# Patient Record
Sex: Female | Born: 1985 | Race: Black or African American | Hispanic: No | Marital: Single | Smoking: Current some day smoker
Health system: Southern US, Community
[De-identification: ages and names within clinical notes are randomized; demographics above are authoritative.]

## PROBLEM LIST (undated history)

## (undated) ENCOUNTER — Inpatient Hospital Stay (HOSPITAL_COMMUNITY): Payer: Self-pay

## (undated) DIAGNOSIS — Z8619 Personal history of other infectious and parasitic diseases: Secondary | ICD-10-CM

## (undated) DIAGNOSIS — R87619 Unspecified abnormal cytological findings in specimens from cervix uteri: Secondary | ICD-10-CM

## (undated) DIAGNOSIS — J45909 Unspecified asthma, uncomplicated: Secondary | ICD-10-CM

## (undated) DIAGNOSIS — IMO0002 Reserved for concepts with insufficient information to code with codable children: Secondary | ICD-10-CM

## (undated) HISTORY — DX: Unspecified abnormal cytological findings in specimens from cervix uteri: R87.619

## (undated) HISTORY — PX: COLPOSCOPY: SHX161

## (undated) HISTORY — DX: Reserved for concepts with insufficient information to code with codable children: IMO0002

## (undated) HISTORY — DX: Personal history of other infectious and parasitic diseases: Z86.19

---

## 2010-11-13 ENCOUNTER — Encounter: Payer: Self-pay | Admitting: *Deleted

## 2010-11-13 ENCOUNTER — Emergency Department (HOSPITAL_COMMUNITY)
Admission: EM | Admit: 2010-11-13 | Discharge: 2010-11-13 | Disposition: A | Discharge status: Home / Self Care | Payer: Self-pay | Attending: Emergency Medicine | Admitting: Emergency Medicine

## 2010-11-13 DIAGNOSIS — T23139A Burn of first degree of unspecified multiple fingers (nail), not including thumb, initial encounter: Secondary | ICD-10-CM | POA: Insufficient documentation

## 2010-11-13 DIAGNOSIS — T300 Burn of unspecified body region, unspecified degree: Secondary | ICD-10-CM

## 2010-11-13 DIAGNOSIS — X131XXA Other contact with steam and other hot vapors, initial encounter: Secondary | ICD-10-CM | POA: Insufficient documentation

## 2010-11-13 DIAGNOSIS — F17200 Nicotine dependence, unspecified, uncomplicated: Secondary | ICD-10-CM | POA: Insufficient documentation

## 2010-11-13 DIAGNOSIS — X12XXXA Contact with other hot fluids, initial encounter: Secondary | ICD-10-CM | POA: Insufficient documentation

## 2010-11-13 MED ORDER — IBUPROFEN 800 MG PO TABS
800.0000 mg | ORAL_TABLET | Freq: Once | ORAL | Status: AC
  Administered 2010-11-13: 800 mg via ORAL
  Filled 2010-11-13: qty 1

## 2010-11-13 MED ORDER — HYDROCODONE-ACETAMINOPHEN 5-325 MG PO TABS
1.0000 | ORAL_TABLET | Freq: Once | ORAL | Status: AC
  Administered 2010-11-13: 1 via ORAL
  Filled 2010-11-13: qty 1

## 2010-11-13 MED ORDER — IBUPROFEN 800 MG PO TABS: 800.0000 mg | ORAL_TABLET | Freq: Once | ORAL | Status: AC

## 2010-11-13 MED ORDER — HYDROCODONE-ACETAMINOPHEN 5-325 MG PO TABS: 1.0000 | ORAL_TABLET | ORAL | Status: AC | PRN

## 2010-11-13 MED ORDER — SILVER SULFADIAZINE 1 % EX CREA
TOPICAL_CREAM | Freq: Once | CUTANEOUS | Status: AC
  Administered 2010-11-13: 18:00:00 via TOPICAL
  Filled 2010-11-13: qty 50
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
## 2010-11-15 NOTE — ED Provider Notes (Signed)
Medical screening examination/treatment/procedure(s) were performed by non-physician practitioner and as supervising physician I was immediately available for consultation/collaboration.  Joya Gaskins, MD 11/15/10 [phone]

## 2011-04-15 ENCOUNTER — Emergency Department (HOSPITAL_COMMUNITY)
Admission: EM | Admit: 2011-04-15 | Discharge: 2011-04-15 | Disposition: A | Discharge status: Home / Self Care | Payer: Medicaid - Out of State | Attending: Emergency Medicine | Admitting: Emergency Medicine

## 2011-04-15 ENCOUNTER — Encounter (HOSPITAL_COMMUNITY): Payer: Self-pay | Admitting: Oncology

## 2011-04-15 DIAGNOSIS — J029 Acute pharyngitis, unspecified: Secondary | ICD-10-CM

## 2011-04-15 DIAGNOSIS — R05 Cough: Secondary | ICD-10-CM | POA: Insufficient documentation

## 2011-04-15 DIAGNOSIS — F17200 Nicotine dependence, unspecified, uncomplicated: Secondary | ICD-10-CM | POA: Insufficient documentation

## 2011-04-15 DIAGNOSIS — J3489 Other specified disorders of nose and nasal sinuses: Secondary | ICD-10-CM | POA: Insufficient documentation

## 2011-04-15 DIAGNOSIS — R059 Cough, unspecified: Secondary | ICD-10-CM | POA: Insufficient documentation

## 2011-04-15 DIAGNOSIS — R599 Enlarged lymph nodes, unspecified: Secondary | ICD-10-CM | POA: Insufficient documentation

## 2011-04-15 LAB — RAPID STREP SCREEN (MED CTR MEBANE ONLY): Streptococcus, Group A Screen (Direct): NEGATIVE

## 2011-04-15 MED ORDER — AMOXICILLIN 500 MG PO CAPS: 500.0000 mg | ORAL_CAPSULE | Freq: Three times a day (TID) | ORAL | Status: AC

## 2011-04-15 MED ORDER — MAGIC MOUTHWASH W/LIDOCAINE: ORAL | Status: DC

## 2011-04-15 NOTE — ED Provider Notes (Signed)
History     CSN: [id number]  Arrival date & time 04/15/11  4098   First MD Initiated Contact with Patient 04/15/11 [phone]      Chief Complaint  Patient presents with  . Sore Throat  . Cough    (Consider location/radiation/quality/duration/timing/severity/associated sxs/prior treatment) Patient is a 26 y.o. female presenting with pharyngitis and cough. The history is provided by the patient.  Sore Throat This is a new problem. The current episode started in the past 7 days. The problem occurs constantly. The problem has been gradually worsening. Associated symptoms include congestion, coughing, a sore throat and swollen glands. Pertinent negatives include no abdominal pain, arthralgias, change in bowel habit, chills, fever, headaches, myalgias, nausea, neck pain, numbness, urinary symptoms, vomiting or weakness. The symptoms are aggravated by swallowing. She has tried nothing for the symptoms. The treatment provided no relief.  Cough Associated symptoms include rhinorrhea and sore throat. Pertinent negatives include no chills, no ear pain, no headaches and no myalgias.    History reviewed. No pertinent past medical history.  History reviewed. No pertinent past surgical history.  History reviewed. No pertinent family history.  History  Substance Use Topics  . Smoking status: Current Everyday Smoker -- 0.2 packs/day    Types: Cigarettes  . Smokeless tobacco: Not on file  . Alcohol Use: Yes     occasionally    OB History    Grav Para Term Preterm Abortions TAB SAB Ect Mult Living                  Review of Systems  Constitutional: Positive for appetite change. Negative for fever, chills and activity change.  HENT: Positive for congestion, sore throat and rhinorrhea. Negative for ear pain, trouble swallowing, neck pain and neck stiffness.   Eyes: Negative for visual disturbance.  Respiratory: Positive for cough. Negative for chest tightness.   Gastrointestinal: Negative for  nausea, vomiting, abdominal pain and change in bowel habit.  Musculoskeletal: Negative for myalgias and arthralgias.  Neurological: Negative for dizziness, weakness, numbness and headaches.  Hematological: Positive for adenopathy.  All other systems reviewed and are negative.    Allergies  Review of patient's allergies indicates no known allergies.  Home Medications   Current Outpatient Rx  Name Route Sig Dispense Refill  . OVER THE COUNTER MEDICATION Oral Take 1 tablet by mouth daily as needed. Pain Reliever/Cough Medication     . PRESCRIPTION MEDICATION Oral Take 1 tablet by mouth daily. Birth Control       BP 113/66  Pulse 84  Temp(Src) 98 F (36.7 C) (Oral)  Resp 18  Ht 5\' 4"  (1.626 m)  Wt 152 lb (68.947 kg)  BMI 26.09 kg/m2  SpO2 100%  LMP 02/21/2011  Physical Exam  Nursing note and vitals reviewed. Constitutional: She is oriented to person, place, and time. She appears well-developed and well-nourished. No distress.  HENT:  Head: Normocephalic and atraumatic. No trismus in the jaw.  Right Ear: Tympanic membrane and ear canal normal.  Left Ear: Tympanic membrane and ear canal normal.  Nose: Nose normal.  Mouth/Throat: Uvula is midline and mucous membranes are normal. No uvula swelling or dental caries. Posterior oropharyngeal edema and posterior oropharyngeal erythema present. No oropharyngeal exudate or tonsillar abscesses.  Neck: Normal range of motion. Neck supple.  Cardiovascular: Normal rate, regular rhythm and normal heart sounds.   Pulmonary/Chest: Effort normal and breath sounds normal.  Lymphadenopathy:    She has cervical adenopathy.  Neurological: She is oriented to person,  place, and time. She exhibits normal muscle tone. Coordination normal.  Skin: Skin is warm and dry.    ED Course  Procedures (including critical care time)  Results for orders placed during the hospital encounter of 04/15/11  RAPID STREP SCREEN      Component Value Range    Streptococcus, Group A Screen (Direct) NEGATIVE  NEGATIVE         MDM    Patient is alert, NAD.  Mucous membranes are moist, non-toxic appearing.  No meningeal signs        Deretha Ertle L. Kohlton [place], [place] 04/15/11 1016

## 2011-04-15 NOTE — ED Notes (Signed)
Pt reports mild cough and sore throat onset x 2 days ago - taking a OTC cough medicine w/o relief of symptoms.  Denies fevers, vomiting, diarrhea.

## 2011-04-17 NOTE — ED Provider Notes (Signed)
Medical screening examination/treatment/procedure(s) were performed by non-physician practitioner and as supervising physician I was immediately available for consultation/collaboration.   Shelda Jakes, MD 04/17/11 1600

## 2011-05-26 ENCOUNTER — Emergency Department (HOSPITAL_COMMUNITY)
Admission: EM | Admit: 2011-05-26 | Discharge: 2011-05-26 | Disposition: A | Discharge status: Home / Self Care | Payer: Medicaid - Out of State | Attending: Emergency Medicine | Admitting: Emergency Medicine

## 2011-05-26 ENCOUNTER — Emergency Department (HOSPITAL_COMMUNITY): Payer: Medicaid - Out of State

## 2011-05-26 ENCOUNTER — Encounter (HOSPITAL_COMMUNITY): Payer: Self-pay | Admitting: *Deleted

## 2011-05-26 DIAGNOSIS — A499 Bacterial infection, unspecified: Secondary | ICD-10-CM | POA: Insufficient documentation

## 2011-05-26 DIAGNOSIS — O2390 Unspecified genitourinary tract infection in pregnancy, unspecified trimester: Secondary | ICD-10-CM | POA: Insufficient documentation

## 2011-05-26 DIAGNOSIS — F17200 Nicotine dependence, unspecified, uncomplicated: Secondary | ICD-10-CM | POA: Insufficient documentation

## 2011-05-26 DIAGNOSIS — B9689 Other specified bacterial agents as the cause of diseases classified elsewhere: Secondary | ICD-10-CM | POA: Insufficient documentation

## 2011-05-26 DIAGNOSIS — N760 Acute vaginitis: Secondary | ICD-10-CM | POA: Insufficient documentation

## 2011-05-26 DIAGNOSIS — N949 Unspecified condition associated with female genital organs and menstrual cycle: Secondary | ICD-10-CM | POA: Insufficient documentation

## 2011-05-26 LAB — URINALYSIS, ROUTINE W REFLEX MICROSCOPIC
Bilirubin Urine: NEGATIVE
Glucose, UA: NEGATIVE mg/dL
Ketones, ur: NEGATIVE mg/dL
Leukocytes, UA: NEGATIVE
Protein, ur: NEGATIVE mg/dL
pH: 5.5 (ref 5.0–8.0)

## 2011-05-26 LAB — DIFFERENTIAL
Basophils Absolute: 0 10*3/uL (ref 0.0–0.1)
Basophils Relative: 0 % (ref 0–1)
Eosinophils Absolute: 0.1 10*3/uL (ref 0.0–0.7)
Monocytes Relative: 7 % (ref 3–12)
Neutro Abs: 7 10*3/uL (ref 1.7–7.7)
Neutrophils Relative %: 68 % (ref 43–77)

## 2011-05-26 LAB — CBC
Hemoglobin: 13.2 g/dL (ref 12.0–15.0)
MCH: 33.4 pg (ref 26.0–34.0)
MCHC: 36.8 g/dL — ABNORMAL HIGH (ref 30.0–36.0)
Platelets: 262 10*3/uL (ref 150–400)
RDW: 12.4 % (ref 11.5–15.5)

## 2011-05-26 LAB — POCT PREGNANCY, URINE: Preg Test, Ur: POSITIVE — AB

## 2011-05-26 LAB — WET PREP, GENITAL

## 2011-05-26 LAB — ABO/RH: ABO/RH(D): O POS

## 2011-05-26 MED ORDER — METRONIDAZOLE 500 MG PO TABS: 500.0000 mg | ORAL_TABLET | Freq: Two times a day (BID) | ORAL | Status: AC

## 2011-05-26 NOTE — ED Notes (Signed)
Pt returned from ultrasound

## 2011-05-26 NOTE — ED Notes (Signed)
MD at bedside. 

## 2011-05-26 NOTE — ED Provider Notes (Signed)
History     CSN: [id number]  Arrival date & time 05/26/11  4098   First MD Initiated Contact with Patient 05/26/11 1850      Chief Complaint  Patient presents with  . Abdominal Pain    HPI Pt was seen at 1905.   Per pt, c/o gradual onset and persistence of constant bilateral pelvic "pain" x3 weeks.  States she had a "gush of blood" from her vagina approx 2-3 weeks ago, but has not had any vaginal bleeding since.  LMP approx 2 months ago (December).  Pt states she took multiple home pregnancy tests "that were negative," but she has hx of ectopic and is concerned for same.  Denies back/flank pain, no dysuria, no N/V/D, no rash, no CP/SOB.      Past Medical History  Diagnosis Date  . Ectopic pregnancy   . Spontaneous abortion     x 3    History reviewed. No pertinent past surgical history.   History  Substance Use Topics  . Smoking status: Current Everyday Smoker -- 0.2 packs/day    Types: Cigarettes  . Smokeless tobacco: Not on file  . Alcohol Use: Yes     occasionally    Review of Systems ROS: Statement: All systems negative except as marked or noted in the HPI; Constitutional: Negative for fever and chills. ; ; Eyes: Negative for eye pain, redness and discharge. ; ; ENMT: Negative for ear pain, hoarseness, nasal congestion, sinus pressure and sore throat. ; ; Cardiovascular: Negative for chest pain, palpitations, diaphoresis, dyspnea and peripheral edema. ; ; Respiratory: Negative for cough, wheezing and stridor. ; ; Gastrointestinal: Negative for nausea, vomiting, diarrhea, blood in stool, hematemesis, jaundice and rectal bleeding. . ; ; Genitourinary: Negative for dysuria, flank pain and hematuria. ;  GYN:  +one episode of vaginal bleeding, +pelvic pain, no vaginal discharge, no vulvar pain. ;; Musculoskeletal: Negative for back pain and neck pain. Negative for swelling and trauma.; ; Skin: Negative for pruritus, rash, abrasions, blisters, bruising and skin lesion.; ; Neuro:  Negative for headache, lightheadedness and neck stiffness. Negative for weakness, altered level of consciousness , altered mental status, extremity weakness, paresthesias, involuntary movement, seizure and syncope.     Allergies  Review of patient's allergies indicates no known allergies.  Home Medications   Current Outpatient Rx  Name Route Sig Dispense Refill  . PRENATAL MULTIVITAMIN CH Oral Take 1 tablet by mouth daily.      BP 122/64  Pulse 76  Temp(Src) 99 F (37.2 C) (Oral)  Resp 18  Ht 5\' 4"  (1.626 m)  Wt 153 lb (69.4 kg)  BMI 26.26 kg/m2  SpO2 100%  LMP 03/18/2011  Physical Exam 1915: Physical examination:  Nursing notes reviewed; Vital signs and O2 SAT reviewed;  Constitutional: Well developed, Well nourished, Well hydrated, In no acute distress; Head:  Normocephalic, atraumatic; Eyes: EOMI, PERRL, No scleral icterus; ENMT: Mouth and pharynx normal, Mucous membranes moist; Neck: Supple, Full range of motion, No lymphadenopathy; Cardiovascular: Regular rate and rhythm, No murmur, rub, or gallop; Respiratory: Breath sounds clear & equal bilaterally, No rales, rhonchi, wheezes, or rub, Normal respiratory effort/excursion; Chest: Nontender, Movement normal; Abdomen: Soft, +mild suprapubic tenderness to palp, no rebound or guarding. Nondistended, Normal bowel sounds; Genitourinary: Pelvic exam performed with permission of pt and female ED tech assist during exam.  External genitalia w/o lesions. Vaginal vault with thick white discharge.  Cervix w/o lesions, not friable, GC/chlam and wet prep obtained and sent to lab.  Bimanual  exam w/o CMT, uterine or adenexal tenderness.; Extremities: Pulses normal, No tenderness, No edema, No calf edema or asymmetry.; Neuro: AA&Ox3, Major CN grossly intact.  No gross focal motor or sensory deficits in extremities.; Skin: Color normal, Warm, Dry, no rash.    ED Course  Procedures    MDM  MDM Reviewed: nursing note and vitals Interpretation:  labs and ultrasound   Results for orders placed during the hospital encounter of 05/26/11  URINALYSIS, ROUTINE W REFLEX MICROSCOPIC      Component Value Range   Color, Urine YELLOW  YELLOW    APPearance CLEAR  CLEAR    Specific Gravity, Urine >1.030 (*) 1.005 - 1.030    pH 5.5  5.0 - 8.0    Glucose, UA NEGATIVE  NEGATIVE (mg/dL)   Hgb urine dipstick NEGATIVE  NEGATIVE    Bilirubin Urine NEGATIVE  NEGATIVE    Ketones, ur NEGATIVE  NEGATIVE (mg/dL)   Protein, ur NEGATIVE  NEGATIVE (mg/dL)   Urobilinogen, UA 0.2  0.0 - 1.0 (mg/dL)   Nitrite NEGATIVE  NEGATIVE    Leukocytes, UA NEGATIVE  NEGATIVE   WET PREP, GENITAL      Component Value Range   Yeast Wet Prep HPF POC NONE SEEN  NONE SEEN    Trich, Wet Prep NONE SEEN  NONE SEEN    Clue Cells Wet Prep HPF POC MODERATE (*) NONE SEEN    WBC, Wet Prep HPF POC FEW (*) NONE SEEN   POCT PREGNANCY, URINE      Component Value Range   Preg Test, Ur POSITIVE (*) NEGATIVE   ABO/RH      Component Value Range   ABO/RH(D) O POS    CBC      Component Value Range   WBC 10.2  4.0 - 10.5 (K/uL)   RBC 3.95  3.87 - 5.11 (MIL/uL)   Hemoglobin 13.2  12.0 - 15.0 (g/dL)   HCT 16.1 (*) 09.6 - 46.0 (%)   MCV 90.9  78.0 - 100.0 (fL)   MCH 33.4  26.0 - 34.0 (pg)   MCHC 36.8 (*) 30.0 - 36.0 (g/dL)   RDW 04.5  40.9 - 81.1 (%)   Platelets 262  150 - 400 (K/uL)  DIFFERENTIAL      Component Value Range   Neutrophils Relative 68  43 - 77 (%)   Neutro Abs 7.0  1.7 - 7.7 (K/uL)   Lymphocytes Relative 24  12 - 46 (%)   Lymphs Abs 2.4  0.7 - 4.0 (K/uL)   Monocytes Relative 7  3 - 12 (%)   Monocytes Absolute 0.7  0.1 - 1.0 (K/uL)   Eosinophils Relative 1  0 - 5 (%)   Eosinophils Absolute 0.1  0.0 - 0.7 (K/uL)   Basophils Relative 0  0 - 1 (%)   Basophils Absolute 0.0  0.0 - 0.1 (K/uL)  HCG, QUANTITATIVE, PREGNANCY      Component Value Range   hCG, Beta Chain, Quant, S 38010 (*) <5 (mIU/mL)    US Ob Comp Less 14 Wks 05/26/2011  *RADIOLOGY REPORT*   Clinical Data: Bilateral pelvic pain  OBSTETRIC <14 WK ULTRASOUND  Technique:  Transabdominal ultrasound was performed for evaluation of the gestation as well as the maternal uterus and adnexal regions.  Comparison:  None.  Intrauterine gestational sac: A single intrauterine gestational sac is present. Embryo: A single embryo is noted. Cardiac Activity: Fetal cardiac activity is detected Heart Rate: 149 bpm  CRL:  7.37 mm  13w  3d        [place] EDC: 11/28/2011  Maternal uterus/Adnexae: Neither ovary is visualized.  IMPRESSION: Single living intrauterine embryo of 13 weeks 3 days gestation with [facility] of 11/28/2011  Original Report Authenticated By: Juline Patch, M.D.     9:09 PM:  Dx testing d/w pt.  Questions answered.  Verb understanding, agreeable to d/c home with outpt f/u.           Laray Anger, DO 05/27/11 2054

## 2011-05-26 NOTE — ED Notes (Signed)
Lower abd pain, worse with movement x 3 wks, pt has hx of ectopic pregnancy, LNP was in December.  Denies n/v/d.

## 2011-05-26 NOTE — ED Notes (Signed)
Pt denies any abnormal discharge or bleeding. Pt reports that last month she had one day of heavy bleeding and it stop. Pt reports last menstrual period being last month.

## 2011-05-26 NOTE — ED Notes (Signed)
Pt to ultrasound

## 2011-05-26 NOTE — ED Notes (Signed)
Pt reports taking home pregnancy test x 2 wks ago with negative results.

## 2011-05-28 LAB — URINE CULTURE: Culture  Setup Time: 201302131320

## 2011-05-28 LAB — GC/CHLAMYDIA PROBE AMP, GENITAL: Chlamydia, DNA Probe: NEGATIVE

## 2011-11-24 ENCOUNTER — Inpatient Hospital Stay (HOSPITAL_COMMUNITY)
Admission: AD | Admit: 2011-11-24 | Discharge: 2011-11-24 | Discharge status: Against Medical Advice | Payer: Medicaid Other | Source: Ambulatory Visit | Attending: Obstetrics & Gynecology | Admitting: Obstetrics & Gynecology

## 2011-11-24 ENCOUNTER — Encounter (HOSPITAL_COMMUNITY): Payer: Self-pay

## 2011-11-24 DIAGNOSIS — R109 Unspecified abdominal pain: Secondary | ICD-10-CM | POA: Insufficient documentation

## 2011-11-24 DIAGNOSIS — N949 Unspecified condition associated with female genital organs and menstrual cycle: Secondary | ICD-10-CM | POA: Insufficient documentation

## 2011-11-24 LAB — URINALYSIS, ROUTINE W REFLEX MICROSCOPIC
Bilirubin Urine: NEGATIVE
Ketones, ur: NEGATIVE mg/dL
Leukocytes, UA: NEGATIVE
Nitrite: NEGATIVE
Specific Gravity, Urine: 1.02 (ref 1.005–1.030)
Urobilinogen, UA: 1 mg/dL (ref 0.0–1.0)
pH: 7 (ref 5.0–8.0)

## 2011-11-24 NOTE — MAU Note (Signed)
Patient states she has been having a brown to red vaginal discharge and lower abdominal cramping since this morning. Denies occasional nausea.

## 2011-12-02 LAB — OB RESULTS CONSOLE GC/CHLAMYDIA
Chlamydia: NEGATIVE
Gonorrhea: NEGATIVE

## 2011-12-02 LAB — OB RESULTS CONSOLE RPR: RPR: NONREACTIVE

## 2011-12-02 LAB — OB RESULTS CONSOLE ANTIBODY SCREEN: Antibody Screen: NEGATIVE

## 2011-12-04 ENCOUNTER — Other Ambulatory Visit (HOSPITAL_COMMUNITY): Payer: Self-pay | Admitting: Family

## 2011-12-04 DIAGNOSIS — Z3682 Encounter for antenatal screening for nuchal translucency: Secondary | ICD-10-CM

## 2011-12-09 ENCOUNTER — Ambulatory Visit (HOSPITAL_COMMUNITY)
Admission: RE | Admit: 2011-12-09 | Discharge: 2011-12-09 | Disposition: A | Discharge status: Home / Self Care | Payer: Medicaid Other | Source: Ambulatory Visit | Attending: Family | Admitting: Family

## 2011-12-09 ENCOUNTER — Other Ambulatory Visit: Payer: Self-pay

## 2011-12-09 ENCOUNTER — Encounter (HOSPITAL_COMMUNITY): Payer: Self-pay

## 2011-12-09 DIAGNOSIS — O351XX0 Maternal care for (suspected) chromosomal abnormality in fetus, not applicable or unspecified: Secondary | ICD-10-CM | POA: Insufficient documentation

## 2011-12-09 DIAGNOSIS — O99330 Smoking (tobacco) complicating pregnancy, unspecified trimester: Secondary | ICD-10-CM | POA: Insufficient documentation

## 2011-12-09 DIAGNOSIS — Z3682 Encounter for antenatal screening for nuchal translucency: Secondary | ICD-10-CM

## 2011-12-09 DIAGNOSIS — O3510X0 Maternal care for (suspected) chromosomal abnormality in fetus, unspecified, not applicable or unspecified: Secondary | ICD-10-CM | POA: Insufficient documentation

## 2011-12-09 DIAGNOSIS — Z3689 Encounter for other specified antenatal screening: Secondary | ICD-10-CM | POA: Insufficient documentation

## 2011-12-09 NOTE — Progress Notes (Signed)
Ms. Tay was seen for ultrasound appointment today.  Please see [facility] report for details.  

## 2011-12-25 ENCOUNTER — Institutional Professional Consult (permissible substitution): Payer: Medicaid - Out of State | Admitting: Cardiology

## 2011-12-30 ENCOUNTER — Other Ambulatory Visit (HOSPITAL_COMMUNITY): Payer: Self-pay | Admitting: Physician Assistant

## 2011-12-30 DIAGNOSIS — Z3689 Encounter for other specified antenatal screening: Secondary | ICD-10-CM

## 2012-01-08 ENCOUNTER — Encounter: Payer: Self-pay | Admitting: Cardiology

## 2012-01-08 ENCOUNTER — Encounter: Payer: Self-pay | Admitting: *Deleted

## 2012-01-11 ENCOUNTER — Encounter: Payer: Medicaid - Out of State | Admitting: Cardiology

## 2012-01-11 NOTE — Progress Notes (Signed)
  HPI: 26 yo female with no prior cardiac history for evaluation of palpitations. Patient is [redacted] weeks pregnant.   Current Outpatient Prescriptions  Medication Sig Dispense Refill  . Prenatal Vit-Fe Fumarate-FA (PRENATAL MULTIVITAMIN) TABS Take 1 tablet by mouth daily.        No Known Allergies  Past Medical History  Diagnosis Date  . Arrhythmia     No past surgical history on file.  History   Social History  . Marital Status: Single    Spouse Name: N/A    Number of Children: N/A  . Years of Education: N/A   Occupational History  . Not on file.   Social History Main Topics  . Smoking status: Current Every Day Smoker -- 0.2 packs/day    Types: Cigarettes  . Smokeless tobacco: Not on file  . Alcohol Use: Yes     occasionally  . Drug Use: No  . Sexually Active: Yes    Birth Control/ Protection: None   Other Topics Concern  . Not on file   Social History Narrative  . No narrative on file    Family History  Problem Relation Age of Onset  . Asthma      ROS:  no fevers or chills, productive cough, hemoptysis, dysphasia, odynophagia, melena, hematochezia, dysuria, hematuria, rash, seizure activity, orthopnea, PND, pedal edema, claudication. Remaining systems are negative.  Physical Exam:   Last menstrual period 09/09/2011, unknown if currently breastfeeding.  General:  Well developed/well nourished in NAD Skin warm/dry Patient not depressed No peripheral clubbing Back-normal HEENT-normal/normal eyelids Neck supple/normal carotid upstroke bilaterally; no bruits; no JVD; no thyromegaly chest - CTA/ normal expansion CV - RRR/normal S1 and S2; no murmurs, rubs or gallops;  PMI nondisplaced Abdomen -NT/ND, no HSM, no mass, + bowel sounds, no bruit 2+ femoral pulses, no bruits Ext-no edema, chords, 2+ DP Neuro-grossly nonfocal  ECG    This encounter was created in error - please disregard.

## 2012-01-14 ENCOUNTER — Ambulatory Visit (HOSPITAL_COMMUNITY)
Admission: RE | Admit: 2012-01-14 | Discharge: 2012-01-14 | Disposition: A | Discharge status: Home / Self Care | Payer: Medicaid Other | Source: Ambulatory Visit | Attending: Physician Assistant | Admitting: Physician Assistant

## 2012-01-14 DIAGNOSIS — O358XX0 Maternal care for other (suspected) fetal abnormality and damage, not applicable or unspecified: Secondary | ICD-10-CM | POA: Insufficient documentation

## 2012-01-14 DIAGNOSIS — Z3689 Encounter for other specified antenatal screening: Secondary | ICD-10-CM

## 2012-01-14 DIAGNOSIS — Z363 Encounter for antenatal screening for malformations: Secondary | ICD-10-CM | POA: Insufficient documentation

## 2012-01-14 DIAGNOSIS — O99330 Smoking (tobacco) complicating pregnancy, unspecified trimester: Secondary | ICD-10-CM | POA: Insufficient documentation

## 2012-01-14 DIAGNOSIS — Z1389 Encounter for screening for other disorder: Secondary | ICD-10-CM | POA: Insufficient documentation

## 2012-03-01 ENCOUNTER — Ambulatory Visit: Payer: Medicaid Other | Admitting: Cardiovascular Disease

## 2012-03-01 ENCOUNTER — Encounter: Payer: Self-pay | Admitting: *Deleted

## 2012-03-27 ENCOUNTER — Encounter (HOSPITAL_COMMUNITY): Payer: Self-pay | Admitting: Emergency Medicine

## 2012-03-27 ENCOUNTER — Emergency Department (HOSPITAL_COMMUNITY)
Admission: EM | Admit: 2012-03-27 | Discharge: 2012-03-27 | Disposition: A | Discharge status: Home / Self Care | Payer: Medicaid Other | Attending: Emergency Medicine | Admitting: Emergency Medicine

## 2012-03-27 DIAGNOSIS — R197 Diarrhea, unspecified: Secondary | ICD-10-CM

## 2012-03-27 DIAGNOSIS — Z3490 Encounter for supervision of normal pregnancy, unspecified, unspecified trimester: Secondary | ICD-10-CM

## 2012-03-27 DIAGNOSIS — J45909 Unspecified asthma, uncomplicated: Secondary | ICD-10-CM | POA: Insufficient documentation

## 2012-03-27 DIAGNOSIS — R109 Unspecified abdominal pain: Secondary | ICD-10-CM | POA: Insufficient documentation

## 2012-03-27 DIAGNOSIS — O9989 Other specified diseases and conditions complicating pregnancy, childbirth and the puerperium: Secondary | ICD-10-CM | POA: Insufficient documentation

## 2012-03-27 DIAGNOSIS — I10 Essential (primary) hypertension: Secondary | ICD-10-CM | POA: Insufficient documentation

## 2012-03-27 DIAGNOSIS — O99330 Smoking (tobacco) complicating pregnancy, unspecified trimester: Secondary | ICD-10-CM | POA: Insufficient documentation

## 2012-03-27 DIAGNOSIS — M7989 Other specified soft tissue disorders: Secondary | ICD-10-CM | POA: Insufficient documentation

## 2012-03-27 LAB — BASIC METABOLIC PANEL
Chloride: 101 mEq/L (ref 96–112)
GFR calc Af Amer: >90 mL/min (ref >90)
GFR calc non Af Amer: >90 mL/min (ref >90)
Potassium: 3 mEq/L — ABNORMAL LOW (ref 3.5–5.1)
Sodium: 134 mEq/L — ABNORMAL LOW (ref 135–145)

## 2012-03-27 LAB — URINALYSIS, ROUTINE W REFLEX MICROSCOPIC
Bilirubin Urine: NEGATIVE
Leukocytes, UA: NEGATIVE
Nitrite: NEGATIVE
Specific Gravity, Urine: >1.03 — ABNORMAL HIGH (ref 1.005–1.030)
Urobilinogen, UA: 0.2 mg/dL (ref 0.0–1.0)
pH: 6 (ref 5.0–8.0)

## 2012-03-27 LAB — HEPATIC FUNCTION PANEL
AST: 22 U/L (ref 0–37)
Albumin: 3 g/dL — ABNORMAL LOW (ref 3.5–5.2)
Total Bilirubin: 0.3 mg/dL (ref 0.3–1.2)
Total Protein: 7 g/dL (ref 6.0–8.3)

## 2012-03-27 LAB — CBC WITH DIFFERENTIAL/PLATELET
Basophils Absolute: 0 10*3/uL (ref 0.0–0.1)
Basophils Relative: 0 % (ref 0–1)
Eosinophils Absolute: 0 10*3/uL (ref 0.0–0.7)
Hemoglobin: 13.2 g/dL (ref 12.0–15.0)
MCH: 33.4 pg (ref 26.0–34.0)
MCHC: 35.2 g/dL (ref 30.0–36.0)
Monocytes Relative: 4 % (ref 3–12)
Neutro Abs: 9.2 10*3/uL — ABNORMAL HIGH (ref 1.7–7.7)
Neutrophils Relative %: 87 % — ABNORMAL HIGH (ref 43–77)
Platelets: 241 10*3/uL (ref 150–400)

## 2012-03-27 LAB — LIPASE, BLOOD: Lipase: 34 U/L (ref 11–59)

## 2012-03-27 MED ORDER — POTASSIUM CHLORIDE CRYS ER 20 MEQ PO TBCR
40.0000 meq | EXTENDED_RELEASE_TABLET | Freq: Once | ORAL | Status: AC
  Administered 2012-03-27: 40 meq via ORAL
  Filled 2012-03-27: qty 2

## 2012-03-27 MED ORDER — SODIUM CHLORIDE 0.9 % IV SOLN: Freq: Once | INTRAVENOUS | Status: DC

## 2012-03-27 MED ORDER — ONDANSETRON HCL 4 MG/2ML IJ SOLN
4.0000 mg | Freq: Once | INTRAMUSCULAR | Status: AC
  Administered 2012-03-27: 4 mg via INTRAVENOUS
  Filled 2012-03-27: qty 2

## 2012-03-27 MED ORDER — SODIUM CHLORIDE 0.9 % IV BOLUS (SEPSIS)
1000.0000 mL | Freq: Once | INTRAVENOUS | Status: AC
  Administered 2012-03-27: 1000 mL via INTRAVENOUS

## 2012-03-27 NOTE — ED Provider Notes (Signed)
History   This chart was scribed for Jordan Horn, MD, by Frederik Pear, ER scribe. The patient was seen in room APA01/APA01 and the patient's care was started at 1544.    CSN: [id number]  Arrival date & time 03/27/12  1544   None     Chief Complaint  Patient presents with  . Abdominal Pain  . Migraine    (Consider location/radiation/quality/duration/timing/severity/associated sxs/prior treatment) HPI Comments: Jordan Hall is a 26 y.o. female who presents to the Emergency Department complaining of constant abdominal pain that is aggravated by movement and improved with laying still that began 7 hours ago. She also reports intermittent possible contractions in her back and abdomen that was different than the constant abdominal pain and began around 19:00 last night that lasted approximately 7-8 minutes and last about an hour and returned for the same about of time during the early AM. She denies any associated vaginal fluids or bleeding. She denies any associated changes in speech, vision, swallowing, understanding, numbness, weakness, or coordination. She reports that this is her fifth pregnancy and is due 06/16/11. She currently has two children and one ectopic pregnancy and one spontaneous abortion.   She reports associated diarrhea 3x with no hematochezia and nausea that began earlier today. Her significant other also reports that he has been experiencing diarrhea as well that began about the same time. She denies any associated emesis, fever, chest pain, or hallucinations. She also reports a mild headache, but states that she has a h/o gradual onset, mild headaches when she is pregnant.        History reviewed. No pertinent past medical history.  History reviewed. No pertinent past surgical history.  Family History  Problem Relation Age of Onset  . Asthma    . Hypertension      History  Substance Use Topics  . Smoking status: Current Every Day Smoker -- 0.2 packs/day     Types: Cigarettes  . Smokeless tobacco: Not on file  . Alcohol Use: Yes     Comment: occasionally    OB History    Grav Para Term Preterm Abortions TAB SAB Ect Mult Living   5 1 1  3 3    1       Review of Systems A complete 10 system review of systems was obtained and all systems are negative except as noted in the HPI and PMH.  Allergies  Review of patient's allergies indicates no known allergies.  Home Medications   Current Outpatient Rx  Name  Route  Sig  Dispense  Refill  . PRENATAL MULTIVITAMIN CH   Oral   Take 1 tablet by mouth daily.           BP 128/72  Pulse 102  Temp 97.9 F (36.6 C) (Oral)  Resp 20  Ht 5\' 4"  (1.626 m)  Wt 172 lb (78.019 kg)  BMI 29.52 kg/m2  SpO2 100%  LMP 09/09/2011  Physical Exam  Nursing note and vitals reviewed. Constitutional: She is oriented to person, place, and time.       Awake, alert, nontoxic appearance with baseline speech for patient.  HENT:  Head: Atraumatic.  Mouth/Throat: No oropharyngeal exudate.  Eyes: EOM are normal. Pupils are equal, round, and reactive to light. Right eye exhibits no discharge. Left eye exhibits no discharge.  Neck: Neck supple.  Cardiovascular: Normal rate and regular rhythm.   No murmur heard. Pulmonary/Chest: Effort normal and breath sounds normal. No stridor. No respiratory distress. She has no  wheezes. She has no rales. She exhibits no tenderness.  Abdominal: Soft. Bowel sounds are normal. She exhibits no mass. There is no tenderness. There is no rebound.       Her fundus is 5 cm above the umbilicus. She has mild epigastric tenderness.   Genitourinary:       Her cervix is still closed and soft. She had no blood or discharge on the exam gloves with a chaperone present.  Musculoskeletal: She exhibits no tenderness.       Baseline ROM, moves extremities with no obvious new focal weakness.  Lymphadenopathy:    She has no cervical adenopathy.  Neurological: She is alert and oriented to  person, place, and time. No cranial nerve deficit.       Awake, alert, cooperative and aware of situation; motor strength bilaterally; sensation normal to light touch bilaterally; peripheral visual fields full to confrontation; no facial asymmetry; tongue midline; major cranial nerves appear intact; no pronator drift, normal finger to nose bilaterally.  Skin: No rash noted.  Psychiatric: She has a normal mood and affect.    ED Course  Procedures (including critical care time)  DIAGNOSTIC STUDIES: Oxygen Saturation is 100% on room air, norma by my interpretation.    COORDINATION OF CARE:  16:25- Patient and family understand and agree with initial ED impression and plan with expectations set for ED visit.  18:10- Patient is feeling much better and has an appointment at [facility] in the AM.   [facility]  Results for orders placed during the hospital encounter of 03/27/12  CBC WITH DIFFERENTIAL      Component Value Range   WBC 10.5  4.0 - 10.5 K/uL   RBC 3.95  3.87 - 5.11 MIL/uL   Hemoglobin 13.2  12.0 - 15.0 g/dL   HCT 16.1  09.6 - 04.5 %   MCV 94.9  78.0 - 100.0 fL   MCH 33.4  26.0 - 34.0 pg   MCHC 35.2  30.0 - 36.0 g/dL   RDW 40.9  81.1 - 91.4 %   Platelets 241  150 - 400 K/uL   Neutrophils Relative 87 (*) 43 - 77 %   Neutro Abs 9.2 (*) 1.7 - 7.7 K/uL   Lymphocytes Relative 8 (*) 12 - 46 %   Lymphs Abs 0.9  0.7 - 4.0 K/uL   Monocytes Relative 4  3 - 12 %   Monocytes Absolute 0.4  0.1 - 1.0 K/uL   Eosinophils Relative 0  0 - 5 %   Eosinophils Absolute 0.0  0.0 - 0.7 K/uL   Basophils Relative 0  0 - 1 %   Basophils Absolute 0.0  0.0 - 0.1 K/uL  BASIC METABOLIC PANEL      Component Value Range   Sodium 134 (*) 135 - 145 mEq/L   Potassium 3.0 (*) 3.5 - 5.1 mEq/L   Chloride 101  96 - 112 mEq/L   CO2 25  19 - 32 mEq/L   Glucose, Bld 87  70 - 99 mg/dL   BUN 6  6 - 23 mg/dL   Creatinine, Ser 7.82  0.50 - 1.10 mg/dL   Calcium 8.8  8.4 -  95.6 mg/dL   GFR calc non Af Amer >90  >90 mL/min   GFR calc Af Amer >90  >90 mL/min  URINALYSIS, ROUTINE W REFLEX MICROSCOPIC      Component Value Range   Color, Urine YELLOW  YELLOW   APPearance CLEAR  CLEAR  Specific Gravity, Urine >1.030 (*) 1.005 - 1.030   pH 6.0  5.0 - 8.0   Glucose, UA NEGATIVE  NEGATIVE mg/dL   Hgb urine dipstick NEGATIVE  NEGATIVE   Bilirubin Urine NEGATIVE  NEGATIVE   Ketones, ur NEGATIVE  NEGATIVE mg/dL   Protein, ur NEGATIVE  NEGATIVE mg/dL   Urobilinogen, UA 0.2  0.0 - 1.0 mg/dL   Nitrite NEGATIVE  NEGATIVE   Leukocytes, UA NEGATIVE  NEGATIVE  HEPATIC FUNCTION PANEL      Component Value Range   Total Protein 7.0  6.0 - 8.3 g/dL   Albumin 3.0 (*) 3.5 - 5.2 g/dL   AST 22  0 - 37 U/L   ALT 27  0 - 35 U/L   Alkaline Phosphatase 74  39 - 117 U/L   Total Bilirubin 0.3  0.3 - 1.2 mg/dL   Bilirubin, Direct <4.0  0.0 - 0.3 mg/dL   Indirect Bilirubin NOT CALCULATED  0.3 - 0.9 mg/dL  LIPASE, BLOOD      Component Value Range   Lipase 34  11 - 59 U/L    Labs Reviewed  CBC WITH DIFFERENTIAL - Abnormal; Notable for the following:    Neutrophils Relative 87 (*)     Neutro Abs 9.2 (*)     Lymphocytes Relative 8 (*)     All other components within normal limits  BASIC METABOLIC PANEL - Abnormal; Notable for the following:    Sodium 134 (*)     Potassium 3.0 (*)     All other components within normal limits  URINALYSIS, ROUTINE W REFLEX MICROSCOPIC - Abnormal; Notable for the following:    Specific Gravity, Urine >1.030 (*)     All other components within normal limits  HEPATIC FUNCTION PANEL - Abnormal; Notable for the following:    Albumin 3.0 (*)     All other components within normal limits  LIPASE, BLOOD  LAB REPORT - SCANNED   No results found.   1. Diarrhea   2. Abdominal pain   3. Pregnancy       MDM  Patient / Family / Caregiver informed of clinical course, understand medical decision-making process, and agree with plan.  I doubt any  other EMC precluding discharge at this time including, but not necessarily limited to the following:SAH, SBI, peritonitis, labor.  I personally performed the services described in this documentation, which was scribed in my presence. The recorded information has been reviewed and is accurate.        Jordan Horn, MD 03/28/12 2228

## 2012-03-27 NOTE — ED Notes (Signed)
Pt examined by dr.bednar -bi-manual performed, cervix closed, pt denies any vaginal bleeding or discharge. Denies fever.  Pt moved to exam room, 1 for ob monitor, rapid response aware.

## 2012-03-27 NOTE — Consult Note (Signed)
Second reviewer of strip=Heather [employer]

## 2012-03-27 NOTE — ED Notes (Addendum)
Pt reports being 6 months pregnant, due march 5th,   She has been having diarrhea and had contractions every 5-7 minutes last night, no contractions today, but cont. To have low back pain and ab pain, has been feeling the baby move today. Also having a headache.   fht obtained at 170.   ** 5th pregnancy, 2 live births, 1 stillborn, 1 abortions/mis.  Dr. Fonnie Jarvis notified of pt's arrival

## 2012-03-27 NOTE — ED Notes (Signed)
Per katie, rapid response, no contractions at present. Able to d/c from fetal monitor at present.

## 2012-03-27 NOTE — ED Notes (Signed)
Pt c/o abd pain and back pain pain since 4 am.

## 2012-03-27 NOTE — Progress Notes (Signed)
Fhr with moderate variability with 10x10 and some 15x15 accels, no decels; no contractions seen;no vag bleeding, leaking fluid.  RROB gave Tracy,APED-RN the  faculty attending number if md would like to call and consult ob; plan to give pt potassium bc it is a little low; pt already received zofran and continuous  iv fluids.

## 2012-04-16 ENCOUNTER — Encounter (HOSPITAL_COMMUNITY): Payer: Self-pay | Admitting: Family

## 2012-04-16 ENCOUNTER — Inpatient Hospital Stay (HOSPITAL_COMMUNITY)
Admission: AD | Admit: 2012-04-16 | Discharge: 2012-04-16 | Disposition: A | Discharge status: Home / Self Care | Payer: Medicaid Other | Source: Ambulatory Visit | Attending: Obstetrics & Gynecology | Admitting: Obstetrics & Gynecology

## 2012-04-16 DIAGNOSIS — O99891 Other specified diseases and conditions complicating pregnancy: Secondary | ICD-10-CM | POA: Insufficient documentation

## 2012-04-16 DIAGNOSIS — K529 Noninfective gastroenteritis and colitis, unspecified: Secondary | ICD-10-CM

## 2012-04-16 DIAGNOSIS — R109 Unspecified abdominal pain: Secondary | ICD-10-CM | POA: Insufficient documentation

## 2012-04-16 DIAGNOSIS — A0811 Acute gastroenteropathy due to Norwalk agent: Secondary | ICD-10-CM | POA: Insufficient documentation

## 2012-04-16 DIAGNOSIS — K5289 Other specified noninfective gastroenteritis and colitis: Secondary | ICD-10-CM

## 2012-04-16 DIAGNOSIS — O212 Late vomiting of pregnancy: Secondary | ICD-10-CM | POA: Insufficient documentation

## 2012-04-16 LAB — URINALYSIS, ROUTINE W REFLEX MICROSCOPIC
Ketones, ur: >80 mg/dL — AB
Leukocytes, UA: NEGATIVE
Nitrite: NEGATIVE
Protein, ur: NEGATIVE mg/dL
pH: 6 (ref 5.0–8.0)

## 2012-04-16 LAB — CBC WITH DIFFERENTIAL/PLATELET
Basophils Absolute: 0 10*3/uL (ref 0.0–0.1)
Basophils Relative: 0 % (ref 0–1)
Hemoglobin: 13.8 g/dL (ref 12.0–15.0)
Lymphocytes Relative: 6 % — ABNORMAL LOW (ref 12–46)
MCHC: 34.7 g/dL (ref 30.0–36.0)
Neutro Abs: 13.2 10*3/uL — ABNORMAL HIGH (ref 1.7–7.7)
Neutrophils Relative %: 90 % — ABNORMAL HIGH (ref 43–77)
RDW: 13.3 % (ref 11.5–15.5)
WBC: 14.6 10*3/uL — ABNORMAL HIGH (ref 4.0–10.5)

## 2012-04-16 LAB — COMPREHENSIVE METABOLIC PANEL
ALT: 20 U/L (ref 0–35)
AST: 22 U/L (ref 0–37)
Albumin: 3 g/dL — ABNORMAL LOW (ref 3.5–5.2)
Alkaline Phosphatase: 93 U/L (ref 39–117)
CO2: 25 mEq/L (ref 19–32)
Chloride: 100 mEq/L (ref 96–112)
Potassium: 3.5 mEq/L (ref 3.5–5.1)
Total Bilirubin: 0.4 mg/dL (ref 0.3–1.2)

## 2012-04-16 MED ORDER — PROMETHAZINE HCL 25 MG/ML IJ SOLN
12.5000 mg | Freq: Once | INTRAMUSCULAR | Status: AC
  Administered 2012-04-16: 12.5 mg via INTRAVENOUS
  Filled 2012-04-16: qty 1

## 2012-04-16 MED ORDER — LACTATED RINGERS IV BOLUS (SEPSIS)
1000.0000 mL | Freq: Once | INTRAVENOUS | Status: AC
  Administered 2012-04-16: 1000 mL via INTRAVENOUS

## 2012-04-16 MED ORDER — PROMETHAZINE HCL 12.5 MG PO TABS: 12.5000 mg | ORAL_TABLET | Freq: Four times a day (QID) | ORAL | Status: DC | PRN

## 2012-04-16 NOTE — MAU Note (Addendum)
Patient presents to MAU with c/o of generalized abdominal pain and emesis x 5 and diarrhea x 3 since 1500 today.  Reports good fetal movement; denies vaginal bleeding/discharge/or cramping.

## 2012-04-16 NOTE — MAU Note (Signed)
Just started with n/v/d few hours ago. Came out of blue. No one at home sick

## 2012-04-16 NOTE — MAU Provider Note (Signed)
History     CSN: [id number]  Arrival date and time: 04/16/12 1921   None     Chief Complaint  Patient presents with  . Nausea  . Emesis During Pregnancy  . Diarrhea   HPI Jordan Hall is a 27 y.o. [phone] female at [redacted]w[redacted]d who presents w/ report of nausea w/ vomiting x 5 and diarrhea x 3 w/ some related upper abdominal cramping that began at 1700.  Hasn't been around anyone w/ norovirus that she's aware of. Denies vb, lof, or uc's.  Reports good fm.  Denies urinary frequency, hesitancy, urgency, or dysuria.  Next appt at [facility] on Tues.   Hasn't had any vomiting or diarrhea since being here.  Has drank 2 cups of gingerale and 1 cup of water per RN.  OB History    Grav Para Term Preterm Abortions TAB SAB Ect Mult Living   5 1 1  3 3    1       History reviewed. No pertinent past medical history.  History reviewed. No pertinent past surgical history.  Family History  Problem Relation Age of Onset  . Asthma    . Hypertension      History  Substance Use Topics  . Smoking status: Former Smoker -- 0.2 packs/day    Types: Cigarettes    Quit date: 02/26/2012  . Smokeless tobacco: Not on file  . Alcohol Use: Yes     Comment: occasionally    Allergies: No Known Allergies  Prescriptions prior to admission  Medication Sig Dispense Refill  . Prenatal Vit-Fe Fumarate-FA (PRENATAL MULTIVITAMIN) TABS Take 1 tablet by mouth daily.        Review of Systems  Constitutional: Negative.  Negative for fever and chills.  HENT: Negative.   Eyes: Negative.   Respiratory: Negative.   Cardiovascular: Negative.   Gastrointestinal: Positive for nausea, vomiting, abdominal pain and diarrhea.  Genitourinary: Negative.   Musculoskeletal: Negative.   Skin: Negative.   Neurological: Negative.  Negative for headaches.  Endo/Heme/Allergies: Negative.   Psychiatric/Behavioral: Negative.    Physical Exam   Blood pressure 128/71, pulse 104, temperature 97.1 F (36.2 C), temperature source  Oral, resp. rate 20, height 5\' 4"  (1.626 m), weight 81.466 kg (179 lb 9.6 oz), last menstrual period 09/09/2011.  Physical Exam  Constitutional: She is oriented to person, place, and time. She appears well-developed and well-nourished.  HENT:  Head: Normocephalic.       Moist mucous membranes  Neck: Normal range of motion.  Cardiovascular: Normal rate and regular rhythm.   Respiratory: Effort normal and breath sounds normal.  GI: Soft. Bowel sounds are normal. She exhibits no distension. There is no tenderness.       gravid  Genitourinary:       Deferred   Musculoskeletal: Normal range of motion.  Neurological: She is alert and oriented to person, place, and time. She has normal reflexes.  Skin: Skin is warm and dry.       No skin tenting  Psychiatric: She has a normal mood and affect. Her behavior is normal. Judgment and thought content normal.   FHR: 155, mod variability, 15x15accels, no decels= Cat I UCs: none MAU Course  Procedures  EFM LR 1 L bolus Phenergan 12.5mg  iv CBC, CMP, UA  No v/d x 2hrs in mau, has drank 2 cups gingerale and 1 cup water  Results for orders placed during the hospital encounter of 04/16/12 (from the past 24 hour(s))  URINALYSIS, ROUTINE W REFLEX MICROSCOPIC  Status: Abnormal   Collection Time   04/16/12  7:45 PM      Component Value Range   Color, Urine YELLOW  YELLOW   APPearance CLEAR  CLEAR   Specific Gravity, Urine >1.030 (*) 1.005 - 1.030   pH 6.0  5.0 - 8.0   Glucose, UA NEGATIVE  NEGATIVE mg/dL   Hgb urine dipstick NEGATIVE  NEGATIVE   Bilirubin Urine NEGATIVE  NEGATIVE   Ketones, ur >80 (*) NEGATIVE mg/dL   Protein, ur NEGATIVE  NEGATIVE mg/dL   Urobilinogen, UA 0.2  0.0 - 1.0 mg/dL   Nitrite NEGATIVE  NEGATIVE   Leukocytes, UA NEGATIVE  NEGATIVE  CBC WITH DIFFERENTIAL     Status: Abnormal   Collection Time   04/16/12  8:10 PM      Component Value Range   WBC 14.6 (*) 4.0 - 10.5 K/uL   RBC 4.15  3.87 - 5.11 MIL/uL    Hemoglobin 13.8  12.0 - 15.0 g/dL   HCT 16.1  09.6 - 04.5 %   MCV 95.9  78.0 - 100.0 fL   MCH 33.3  26.0 - 34.0 pg   MCHC 34.7  30.0 - 36.0 g/dL   RDW 40.9  81.1 - 91.4 %   Platelets 205  150 - 400 K/uL   Neutrophils Relative 90 (*) 43 - 77 %   Neutro Abs 13.2 (*) 1.7 - 7.7 K/uL   Lymphocytes Relative 6 (*) 12 - 46 %   Lymphs Abs 0.9  0.7 - 4.0 K/uL   Monocytes Relative 4  3 - 12 %   Monocytes Absolute 0.5  0.1 - 1.0 K/uL   Eosinophils Relative 0  0 - 5 %   Eosinophils Absolute 0.0  0.0 - 0.7 K/uL   Basophils Relative 0  0 - 1 %   Basophils Absolute 0.0  0.0 - 0.1 K/uL  COMPREHENSIVE METABOLIC PANEL     Status: Abnormal   Collection Time   04/16/12  8:10 PM      Component Value Range   Sodium 136  135 - 145 mEq/L   Potassium 3.5  3.5 - 5.1 mEq/L   Chloride 100  96 - 112 mEq/L   CO2 25  19 - 32 mEq/L   Glucose, Bld 82  70 - 99 mg/dL   BUN 8  6 - 23 mg/dL   Creatinine, Ser 7.82  0.50 - 1.10 mg/dL   Calcium 9.5  8.4 - 95.6 mg/dL   Total Protein 7.0  6.0 - 8.3 g/dL   Albumin 3.0 (*) 3.5 - 5.2 g/dL   AST 22  0 - 37 U/L   ALT 20  0 - 35 U/L   Alkaline Phosphatase 93  39 - 117 U/L   Total Bilirubin 0.4  0.3 - 1.2 mg/dL   GFR calc non Af Amer >90  >90 mL/min   GFR calc Af Amer >90  >90 mL/min   Follow-up Information    Follow up with [facility] [place]. On 04/19/2012. (as scheduled. Return to hospital if unable to keep any foods or fluids down)    Contact information:   [address] [place] [address] [phone]         Jordan Hall, Jordan Hall  Home Medication Instructions GEX:[id number]   Printed on:04/16/12 2141  Medication Information                    Prenatal Vit-Fe Fumarate-FA (PRENATAL MULTIVITAMIN) TABS Take 1 tablet by  mouth daily.           promethazine (PHENERGAN) 12.5 MG tablet Take 1 tablet (12.5 mg total) by mouth every 6 (six) hours as needed for nausea.              Assessment and Plan  A:  [redacted]w[redacted]d SIUP  Cat I  FHR  Norovirus/gastroenteritis P:  D/C home  Rx: Phenergan 12.5mg  po q 6hr prn n/v # 30 w/ 0 RF  Keep appt at [facility] as scheduled on tues  Return to mau if unable to keep foods/fluids down  Marge Duncans 04/16/2012, 9:24 PM

## 2012-05-23 ENCOUNTER — Emergency Department (HOSPITAL_COMMUNITY)
Admission: EM | Admit: 2012-05-23 | Discharge: 2012-05-23 | Disposition: A | Discharge status: Home / Self Care | Payer: Medicaid Other | Attending: Emergency Medicine | Admitting: Emergency Medicine

## 2012-05-23 ENCOUNTER — Encounter (HOSPITAL_COMMUNITY): Payer: Self-pay | Admitting: Emergency Medicine

## 2012-05-23 DIAGNOSIS — O10919 Unspecified pre-existing hypertension complicating pregnancy, unspecified trimester: Secondary | ICD-10-CM | POA: Insufficient documentation

## 2012-05-23 DIAGNOSIS — N898 Other specified noninflammatory disorders of vagina: Secondary | ICD-10-CM | POA: Insufficient documentation

## 2012-05-23 DIAGNOSIS — O9989 Other specified diseases and conditions complicating pregnancy, childbirth and the puerperium: Secondary | ICD-10-CM | POA: Insufficient documentation

## 2012-05-23 DIAGNOSIS — J45909 Unspecified asthma, uncomplicated: Secondary | ICD-10-CM | POA: Insufficient documentation

## 2012-05-23 DIAGNOSIS — Y939 Activity, unspecified: Secondary | ICD-10-CM | POA: Insufficient documentation

## 2012-05-23 DIAGNOSIS — Y929 Unspecified place or not applicable: Secondary | ICD-10-CM | POA: Insufficient documentation

## 2012-05-23 DIAGNOSIS — Z3490 Encounter for supervision of normal pregnancy, unspecified, unspecified trimester: Secondary | ICD-10-CM

## 2012-05-23 DIAGNOSIS — W2203XA Walked into furniture, initial encounter: Secondary | ICD-10-CM | POA: Insufficient documentation

## 2012-05-23 DIAGNOSIS — Z202 Contact with and (suspected) exposure to infections with a predominantly sexual mode of transmission: Secondary | ICD-10-CM

## 2012-05-23 DIAGNOSIS — Z87891 Personal history of nicotine dependence: Secondary | ICD-10-CM | POA: Insufficient documentation

## 2012-05-23 DIAGNOSIS — S6990XA Unspecified injury of unspecified wrist, hand and finger(s), initial encounter: Secondary | ICD-10-CM | POA: Insufficient documentation

## 2012-05-23 DIAGNOSIS — S59909A Unspecified injury of unspecified elbow, initial encounter: Secondary | ICD-10-CM | POA: Insufficient documentation

## 2012-05-23 DIAGNOSIS — M25532 Pain in left wrist: Secondary | ICD-10-CM

## 2012-05-23 DIAGNOSIS — Z79899 Other long term (current) drug therapy: Secondary | ICD-10-CM | POA: Insufficient documentation

## 2012-05-23 LAB — URINE MICROSCOPIC-ADD ON

## 2012-05-23 LAB — URINALYSIS, ROUTINE W REFLEX MICROSCOPIC
Bilirubin Urine: NEGATIVE
Glucose, UA: 100 mg/dL — AB
Hgb urine dipstick: NEGATIVE
Ketones, ur: NEGATIVE mg/dL
Nitrite: NEGATIVE
pH: 7 (ref 5.0–8.0)

## 2012-05-23 LAB — WET PREP, GENITAL: Clue Cells Wet Prep HPF POC: NONE SEEN

## 2012-05-23 MED ORDER — CEFTRIAXONE SODIUM 250 MG IJ SOLR
250.0000 mg | Freq: Once | INTRAMUSCULAR | Status: AC
  Administered 2012-05-23: 250 mg via INTRAMUSCULAR
  Filled 2012-05-23: qty 250

## 2012-05-23 MED ORDER — AZITHROMYCIN 250 MG PO TABS
1000.0000 mg | ORAL_TABLET | Freq: Once | ORAL | Status: AC
  Administered 2012-05-23: 1000 mg via ORAL
  Filled 2012-05-23: qty 4

## 2012-05-23 NOTE — ED Notes (Signed)
Pt accidentally struck her left wrist on a dresser two days ago. C/O continued pain and inability to "put pressure" on left wrist. No obvious deformity.

## 2012-05-23 NOTE — ED Provider Notes (Signed)
History     CSN: [id number]  Arrival date & time 05/23/12  0822   First MD Initiated Contact with Patient 05/23/12 0827      No chief complaint on file.   (Consider location/radiation/quality/duration/timing/severity/associated sxs/prior treatment) HPI Comments: Jordan Hall is a 27 y.o. Female [redacted] weeks pregnant that presents to the ER with multiple complaints. She reports that she has been experiencing clumpy white vaginal dc. Denies any pelvic pressure, abdominal pain, abd cramping, contractions, dysuria, vaginal bleeding, or blood tinged dc. Pt states she believes she might have a yeast infection. In addition she reports hitting her left medial wrist on a dresser 2 days ago and has had wrist pain and swelling since. No relief with ICE and heat therapy. Pain worsened with palpation and certain thumb movements. Severity 4/10. Denies numbess or tingling of extremity, fevers, weakness.   The history is provided by the patient.    History reviewed. No pertinent past medical history.  History reviewed. No pertinent past surgical history.  Family History  Problem Relation Age of Onset  . Asthma    . Hypertension      History  Substance Use Topics  . Smoking status: Former Smoker -- 0.25 packs/day    Types: Cigarettes    Quit date: 02/26/2012  . Smokeless tobacco: Not on file  . Alcohol Use: No     Comment: occasionally    OB History   Grav Para Term Preterm Abortions TAB SAB Ect Mult Living   5 1 1  3 3    1       Review of Systems  All other systems reviewed and are negative.    Allergies  Review of patient's allergies indicates no known allergies.  Home Medications   Current Outpatient Rx  Name  Route  Sig  Dispense  Refill  . buPROPion (ZYBAN) 150 MG 12 hr tablet   Oral   Take 150 mg by mouth 2 (two) times daily.         . Prenatal Vit-Fe Fumarate-FA (PRENATAL MULTIVITAMIN) TABS   Oral   Take 1 tablet by mouth daily.           BP 119/73  Pulse 84   Temp(Src) 97 F (36.1 C) (Oral)  SpO2 100%  LMP 09/09/2011  Physical Exam  Nursing note and vitals reviewed. Constitutional: She appears well-developed and well-nourished. No distress.  HENT:  Head: Normocephalic and atraumatic.  Eyes: Conjunctivae and EOM are normal.  Neck: Normal range of motion. Neck supple.  Cardiovascular:  Intact distal pulses, capillary refill < 3 seconds  Abdominal:  gravid  Genitourinary:  Exam performed by Jaci Carrel,  exam chaperoned Date: 05/23/2012 Pelvic exam: normal external genitalia without evidence of trauma. VULVA: normal appearing vulva with no masses, tenderness or lesion. VAGINA: normal appearing vagina with normal color and discharge, no lesions. CERVIX: normal appearing cervix without lesions, cervical motion tenderness absent, cervical os closed with out purulent discharge; vaginal discharge - white and creamy, Wet prep and DNA probe for chlamydia and GC obtained.    Musculoskeletal:  TTP over the left scaphoid. NO pain w supination or pronation. No obvious deformity. Normal thumb opposition.  All other extremities with normal ROM  Neurological:  No sensory deficit  Skin: She is not diaphoretic.  Skin intact, no tenting    ED Course  Procedures (including critical care time)  Labs Reviewed  WET PREP, GENITAL - Abnormal; Notable for the following:    WBC, Wet Prep HPF POC  MANY (*)    All other components within normal limits  URINALYSIS, ROUTINE W REFLEX MICROSCOPIC - Abnormal; Notable for the following:    APPearance CLOUDY (*)    Glucose, UA 100 (*)    Leukocytes, UA TRACE (*)    All other components within normal limits  URINE MICROSCOPIC-ADD ON - Abnormal; Notable for the following:    Squamous Epithelial / LPF MANY (*)    Bacteria, UA MANY (*)    Casts HYALINE CASTS (*)    All other components within normal limits  GC/CHLAMYDIA PROBE AMP  URINE CULTURE   No results found.   No diagnosis found.    MDM  [redacted] week  pregnant women to ER w multiple complaints. Scaphoid tenderness on exam treated with thumb spica and ortho follow up, pt did not want xray at this time. Pelvic performed for complaint of abnormal clumpy vaginal dc (note pt denied abd pain, vaginal bleeding and contractions). Cervical os was closed & non tender, wet prep showed many WBCs which I will treat empirically with rocephin and azithromycin & pt is aware that gc/chlamydia are pending. UA appears contaminated, but urine culture sent. Pt case discussed with attending who agrees w treatment plan.         Jaci Carrel, [place] 05/23/12 [phone]

## 2012-05-23 NOTE — ED Provider Notes (Signed)
Medical screening examination/treatment/procedure(s) were performed by non-physician practitioner and as supervising physician I was immediately available for consultation/collaboration.   Brisha Mccabe M Merton Wadlow, DO 05/23/12 2145 

## 2012-05-23 NOTE — Progress Notes (Signed)
Orthopedic Tech Progress Note Patient Details:  Jordan Hall 09/10/1985 [id number] Left velcro thumb spica applied and tolerated well. Use and instructions explained to patient.  Ortho Devices Type of Ortho Device: Thumb velcro splint Ortho Device/Splint Location: Left  Ortho Device/Splint Interventions: Application   Asia R Thompson 05/23/2012, 9:11 AM

## 2012-05-24 LAB — URINE CULTURE: Colony Count: 25000

## 2012-05-24 LAB — GC/CHLAMYDIA PROBE AMP: GC Probe RNA: NEGATIVE

## 2012-06-03 ENCOUNTER — Telehealth (HOSPITAL_COMMUNITY): Payer: Self-pay | Admitting: *Deleted

## 2012-06-03 ENCOUNTER — Encounter (HOSPITAL_COMMUNITY): Payer: Self-pay | Admitting: *Deleted

## 2012-06-03 NOTE — Telephone Encounter (Signed)
Preadmission screen  

## 2012-06-14 ENCOUNTER — Inpatient Hospital Stay (EMERGENCY_DEPARTMENT_HOSPITAL)
Admission: AD | Admit: 2012-06-14 | Discharge: 2012-06-14 | Disposition: A | Discharge status: Home / Self Care | Payer: Medicaid Other | Source: Ambulatory Visit | Attending: Obstetrics & Gynecology | Admitting: Obstetrics & Gynecology

## 2012-06-14 ENCOUNTER — Encounter (HOSPITAL_COMMUNITY): Payer: Self-pay | Admitting: *Deleted

## 2012-06-14 ENCOUNTER — Inpatient Hospital Stay (HOSPITAL_COMMUNITY)
Admission: AD | Admit: 2012-06-14 | Discharge: 2012-06-14 | Disposition: A | Discharge status: Home / Self Care | Payer: Medicaid Other | Source: Ambulatory Visit | Attending: Obstetrics & Gynecology | Admitting: Obstetrics & Gynecology

## 2012-06-14 DIAGNOSIS — O99330 Smoking (tobacco) complicating pregnancy, unspecified trimester: Secondary | ICD-10-CM

## 2012-06-14 DIAGNOSIS — J00 Acute nasopharyngitis [common cold]: Secondary | ICD-10-CM

## 2012-06-14 DIAGNOSIS — O99891 Other specified diseases and conditions complicating pregnancy: Secondary | ICD-10-CM | POA: Insufficient documentation

## 2012-06-14 DIAGNOSIS — J029 Acute pharyngitis, unspecified: Secondary | ICD-10-CM | POA: Insufficient documentation

## 2012-06-14 DIAGNOSIS — J069 Acute upper respiratory infection, unspecified: Secondary | ICD-10-CM

## 2012-06-14 DIAGNOSIS — O479 False labor, unspecified: Secondary | ICD-10-CM | POA: Insufficient documentation

## 2012-06-14 DIAGNOSIS — O99313 Alcohol use complicating pregnancy, third trimester: Secondary | ICD-10-CM

## 2012-06-14 DIAGNOSIS — N898 Other specified noninflammatory disorders of vagina: Secondary | ICD-10-CM

## 2012-06-14 DIAGNOSIS — Z3689 Encounter for other specified antenatal screening: Secondary | ICD-10-CM

## 2012-06-14 DIAGNOSIS — O09299 Supervision of pregnancy with other poor reproductive or obstetric history, unspecified trimester: Secondary | ICD-10-CM

## 2012-06-14 MED ORDER — PSEUDOEPHEDRINE HCL 30 MG PO TABS: 30.0000 mg | ORAL_TABLET | ORAL | Status: DC | PRN

## 2012-06-14 MED ORDER — PSEUDOEPHEDRINE HCL 30 MG PO TABS
60.0000 mg | ORAL_TABLET | Freq: Once | ORAL | Status: AC
  Administered 2012-06-14: 60 mg via ORAL
  Filled 2012-06-14 (×2): qty 1

## 2012-06-14 NOTE — MAU Note (Addendum)
Woke up this morning with a sinus cold and headache.  Contractions started last night aroung 2030, coming every 3 minutes- not consistent in strength. 2nd delivery. Had 3 rd degree with first, was told could not push a baby out because of that. Pt is not scheduled for c/s

## 2012-06-14 NOTE — MAU Provider Note (Signed)
History     CSN: [id number]  Arrival date and time: 06/14/12 1014   None     Chief Complaint  Patient presents with  . Labor Eval  . Sinusitis   HPI This is a 27 y.o. female at [redacted]w[redacted]d who presents with c/o contractions. ALso c/o head cold with congestion and sore throat. Denies leaking or bleeding and reports + fetal movement.   OB History   Grav Para Term Preterm Abortions TAB SAB Ect Mult Living   6 2 2  3 3    1       Past Medical History  Diagnosis Date  . Hx of chlamydia infection   . Abnormal Pap smear     Past Surgical History  Procedure Laterality Date  . Colposcopy      Family History  Problem Relation Age of Onset  . Asthma Daughter   . Hypertension Mother   . Thyroid disease Mother   . Hypertension Maternal Aunt   . Hypertension Maternal Grandmother   . Thyroid disease Maternal Grandmother     History  Substance Use Topics  . Smoking status: Former Smoker -- 0.25 packs/day    Types: Cigarettes    Quit date: 02/26/2012  . Smokeless tobacco: Not on file  . Alcohol Use: No     Comment: occasionally    Allergies:  Allergies  Allergen Reactions  . Latex Rash    Prescriptions prior to admission  Medication Sig Dispense Refill  . acetaminophen (TYLENOL) 500 MG tablet Take 500 mg by mouth every 6 (six) hours as needed for pain.      . Prenatal Vit-Fe Fumarate-FA (PRENATAL MULTIVITAMIN) TABS Take 1 tablet by mouth daily.        Review of Systems  Constitutional: Positive for malaise/fatigue. Negative for fever and chills.  HENT: Positive for congestion and sore throat.   Respiratory: Negative for cough and shortness of breath.   Gastrointestinal: Negative for nausea, vomiting and abdominal pain.  Musculoskeletal: Negative for myalgias.  Neurological: Positive for headaches. Negative for weakness.   Physical Exam   Blood pressure 127/68, pulse 115, temperature 98.7 F (37.1 C), temperature source Oral, resp. rate 20, height 5' 4.5" (1.638  m), weight 195 lb (88.451 kg), last menstrual period 09/09/2011.  Physical Exam  Constitutional: She is oriented to person, place, and time. She appears well-developed and well-nourished. No distress.  HENT:  Head: Normocephalic.  Right Ear: External ear normal.  Left Ear: External ear normal.  Mouth/Throat: No oropharyngeal exudate.  Slight bulging of bilateral TMs but +light reflex   Eyes: Right eye exhibits no discharge. Left eye exhibits no discharge.  Neck: Normal range of motion. Neck supple.  Cardiovascular: Normal rate.   Respiratory: Effort normal.  GI: Soft. She exhibits no distension and no mass. There is no tenderness. There is no rebound and no guarding.  Genitourinary:  Dilation: 1 Effacement (%): 60 Cervical Position: Posterior Station: -2;-3 Presentation: Vertex Exam by:: Morrison Old RN  No change after one hour  Musculoskeletal: Normal range of motion.  Neurological: She is alert and oriented to person, place, and time.  Skin: Skin is warm and dry.  Psychiatric: She has a normal mood and affect.    MAU Course  Procedures  Sudafed given with relief  Assessment and Plan  A:  SIUP at [redacted]w[redacted]d       Irregular contractions, no sign of active labor      URI  P:  Discharge home  Discussed labor signs       Discussed cold care      Rx Sudafed   [facility] 06/14/2012, 11:54 AM

## 2012-06-14 NOTE — MAU Note (Signed)
Pt states she has been contracting since 10am pt states contractions are approximately q2 min. Pt states she noticed leakage of fluid at 1845

## 2012-06-14 NOTE — MAU Note (Signed)
Pt reports she was seen earlier and after she went home she had a gush of watery fluid. States the fluid was clear. This happened at 1830.

## 2012-06-14 NOTE — Progress Notes (Signed)
Pt states she had a gush of fluid the first time now she just has leaking

## 2012-06-14 NOTE — MAU Note (Signed)
DOB and name verified; pt confirmed spelling of name on armband.

## 2012-06-14 NOTE — MAU Provider Note (Signed)
History     CSN: [id number]  Arrival date and time: 06/14/12 4098   First Provider Initiated Contact with Patient 06/14/12 2136      Chief Complaint  Patient presents with  . Rupture of Membranes  . Labor Eval   HPI Ms. Ethelwyn Gilbertson is a 27 y.o. [phone] at [redacted]w[redacted]d who presents to MAU today with complaint of contractions and ?LOF. The patient states that her contractions are approximately every 2-3 minutes, they are not causing significant discomfort. She also states that she bent down to pick up a towel before getting in the shower this evening and when she stood up she felt fluid leaking down her leg. She denies vaginal bleeding and reports good fetal movement.   OB History   Grav Para Term Preterm Abortions TAB SAB Ect Mult Living   6 2 2  3 3    1       Past Medical History  Diagnosis Date  . Hx of chlamydia infection   . Abnormal Pap smear     Past Surgical History  Procedure Laterality Date  . Colposcopy      Family History  Problem Relation Age of Onset  . Asthma Daughter   . Hypertension Mother   . Thyroid disease Mother   . Hypertension Maternal Aunt   . Hypertension Maternal Grandmother   . Thyroid disease Maternal Grandmother     History  Substance Use Topics  . Smoking status: Former Smoker -- 0.25 packs/day    Types: Cigarettes    Quit date: 02/26/2012  . Smokeless tobacco: Not on file  . Alcohol Use: No     Comment: occasionally    Allergies:  Allergies  Allergen Reactions  . Latex Rash    Prescriptions prior to admission  Medication Sig Dispense Refill  . acetaminophen (TYLENOL) 500 MG tablet Take 500 mg by mouth as needed for pain.       . Prenatal Vit-Fe Fumarate-FA (PRENATAL MULTIVITAMIN) TABS Take 1 tablet by mouth daily.      . pseudoephedrine (SUDAFED) 30 MG tablet Take 1 tablet (30 mg total) by mouth every 4 (four) hours as needed for congestion.  30 tablet  0    Review of Systems  Gastrointestinal: Positive for abdominal pain.    Physical Exam   Blood pressure 121/78, pulse 108, temperature 100 F (37.8 C), temperature source Oral, resp. rate 18, height 5' 4.5" (1.638 m), weight 195 lb (88.451 kg), last menstrual period 09/09/2011, SpO2 100.00%.  Physical Exam  Constitutional: She is oriented to person, place, and time. She appears well-developed and well-nourished.  Cardiovascular:  Slightly tachycardic  Respiratory: Effort normal.  GI: Soft. There is no tenderness.  Genitourinary: Vagina normal. Cervix exhibits discharge (small amount of this white discharge noted. ). Cervix exhibits no motion tenderness and no friability.  Neg - pooling Neg - ferning  Neurological: She is alert and oriented to person, place, and time.  Skin: Skin is warm and dry. No erythema.  Psychiatric: She has a normal mood and affect.  Dilation: 1 Effacement (%): 50 Cervical Position: Posterior Exam by:: J.Ethier PA  Fetal Monitoring: Baseline: 150 bpm, moderate variability, + accelerations, one variable deceleration Contractions: q 6-8 minutes Reviewed with Philipp Deputy, CNM  MAU Course  Procedures  MDM Discussed patient with Philipp Deputy, CNM. Discharge with labor precautions.   Assessment and Plan  A: Braxton-Hicks contractions  P: Discharge home Labor precautions and fetal kick counts discussed Patient to keep scheduled follow-up with [facility]  Patient encouraged to return to MAU if her condition should change or she is in labor  Freddi Starr, PA-C  06/14/2012, 9:36 PM

## 2012-06-18 ENCOUNTER — Encounter (HOSPITAL_COMMUNITY): Payer: Self-pay | Admitting: Anesthesiology

## 2012-06-18 ENCOUNTER — Encounter (HOSPITAL_COMMUNITY): Payer: Self-pay

## 2012-06-18 ENCOUNTER — Inpatient Hospital Stay (HOSPITAL_COMMUNITY)
Admission: AD | Admit: 2012-06-18 | Discharge: 2012-06-19 | DRG: 775 | Disposition: A | Discharge status: Home / Self Care | Payer: Medicaid Other | Source: Ambulatory Visit | Attending: Obstetrics & Gynecology | Admitting: Obstetrics & Gynecology

## 2012-06-18 ENCOUNTER — Inpatient Hospital Stay (HOSPITAL_COMMUNITY): Payer: Medicaid Other | Admitting: Anesthesiology

## 2012-06-18 LAB — CBC
HCT: 38.7 % (ref 36.0–46.0)
MCH: 33.3 pg (ref 26.0–34.0)
MCV: 94.9 fL (ref 78.0–100.0)
Platelets: 223 10*3/uL (ref 150–400)
RDW: 13.2 % (ref 11.5–15.5)
WBC: 9.1 10*3/uL (ref 4.0–10.5)

## 2012-06-18 LAB — OB RESULTS CONSOLE GBS: GBS: NEGATIVE

## 2012-06-18 MED ORDER — SENNOSIDES-DOCUSATE SODIUM 8.6-50 MG PO TABS
2.0000 | ORAL_TABLET | Freq: Every day | ORAL | Status: DC
  Administered 2012-06-18: 2 via ORAL

## 2012-06-18 MED ORDER — FENTANYL 2.5 MCG/ML BUPIVACAINE 1/10 % EPIDURAL INFUSION (WH - ANES)
14.0000 mL/h | INTRAMUSCULAR | Status: DC
  Filled 2012-06-18: qty 125

## 2012-06-18 MED ORDER — LANOLIN HYDROUS EX OINT: TOPICAL_OINTMENT | CUTANEOUS | Status: DC | PRN

## 2012-06-18 MED ORDER — DIBUCAINE 1 % RE OINT: 1.0000 "application " | TOPICAL_OINTMENT | RECTAL | Status: DC | PRN

## 2012-06-18 MED ORDER — LACTATED RINGERS IV SOLN: INTRAVENOUS | Status: DC

## 2012-06-18 MED ORDER — IBUPROFEN 600 MG PO TABS
600.0000 mg | ORAL_TABLET | Freq: Four times a day (QID) | ORAL | Status: DC | PRN
  Administered 2012-06-18: 600 mg via ORAL
  Filled 2012-06-18: qty 1

## 2012-06-18 MED ORDER — BENZOCAINE-MENTHOL 20-0.5 % EX AERO: 1.0000 "application " | INHALATION_SPRAY | CUTANEOUS | Status: DC | PRN

## 2012-06-18 MED ORDER — DM-GUAIFENESIN ER 30-600 MG PO TB12
1.0000 | ORAL_TABLET | Freq: Two times a day (BID) | ORAL | Status: DC
  Administered 2012-06-18 (×2): 1 via ORAL
  Administered 2012-06-19: 09:00:00 via ORAL
  Filled 2012-06-18 (×5): qty 1

## 2012-06-18 MED ORDER — ONDANSETRON HCL 4 MG PO TABS
4.0000 mg | ORAL_TABLET | ORAL | Status: DC | PRN
  Administered 2012-06-18: 4 mg via ORAL
  Filled 2012-06-18: qty 1

## 2012-06-18 MED ORDER — PHENYLEPHRINE 40 MCG/ML (10ML) SYRINGE FOR IV PUSH (FOR BLOOD PRESSURE SUPPORT)
80.0000 ug | PREFILLED_SYRINGE | INTRAVENOUS | Status: DC | PRN
  Filled 2012-06-18: qty 5

## 2012-06-18 MED ORDER — LIDOCAINE HCL (PF) 1 % IJ SOLN
30.0000 mL | INTRAMUSCULAR | Status: AC | PRN
  Administered 2012-06-18 (×2): 5 mL via SUBCUTANEOUS
  Filled 2012-06-18 (×2): qty 30

## 2012-06-18 MED ORDER — OXYTOCIN 40 UNITS IN LACTATED RINGERS INFUSION - SIMPLE MED
62.5000 mL/h | INTRAVENOUS | Status: DC
  Filled 2012-06-18 (×2): qty 1000

## 2012-06-18 MED ORDER — PRENATAL MULTIVITAMIN CH
1.0000 | ORAL_TABLET | Freq: Every day | ORAL | Status: DC
  Administered 2012-06-18 – 2012-06-19 (×2): 1 via ORAL
  Filled 2012-06-18 (×3): qty 1

## 2012-06-18 MED ORDER — SIMETHICONE 80 MG PO CHEW: 80.0000 mg | CHEWABLE_TABLET | ORAL | Status: DC | PRN

## 2012-06-18 MED ORDER — DIPHENHYDRAMINE HCL 50 MG/ML IJ SOLN: 12.5000 mg | INTRAMUSCULAR | Status: DC | PRN

## 2012-06-18 MED ORDER — ONDANSETRON HCL 4 MG/2ML IJ SOLN: 4.0000 mg | Freq: Four times a day (QID) | INTRAMUSCULAR | Status: DC | PRN

## 2012-06-18 MED ORDER — TETANUS-DIPHTH-ACELL PERTUSSIS 5-2.5-18.5 LF-MCG/0.5 IM SUSP: 0.5000 mL | Freq: Once | INTRAMUSCULAR | Status: DC

## 2012-06-18 MED ORDER — ONDANSETRON HCL 4 MG/2ML IJ SOLN: 4.0000 mg | INTRAMUSCULAR | Status: DC | PRN

## 2012-06-18 MED ORDER — FENTANYL 2.5 MCG/ML BUPIVACAINE 1/10 % EPIDURAL INFUSION (WH - ANES)
INTRAMUSCULAR | Status: DC | PRN
  Administered 2012-06-18: 14 mL/h via EPIDURAL

## 2012-06-18 MED ORDER — OXYCODONE-ACETAMINOPHEN 5-325 MG PO TABS
1.0000 | ORAL_TABLET | ORAL | Status: DC | PRN
  Administered 2012-06-18 – 2012-06-19 (×3): 1 via ORAL
  Filled 2012-06-18 (×3): qty 1

## 2012-06-18 MED ORDER — PHENYLEPHRINE 40 MCG/ML (10ML) SYRINGE FOR IV PUSH (FOR BLOOD PRESSURE SUPPORT): 80.0000 ug | PREFILLED_SYRINGE | INTRAVENOUS | Status: DC | PRN

## 2012-06-18 MED ORDER — WITCH HAZEL-GLYCERIN EX PADS: 1.0000 "application " | MEDICATED_PAD | CUTANEOUS | Status: DC | PRN

## 2012-06-18 MED ORDER — LACTATED RINGERS IV SOLN
500.0000 mL | Freq: Once | INTRAVENOUS | Status: AC
  Administered 2012-06-18: 500 mL via INTRAVENOUS

## 2012-06-18 MED ORDER — IBUPROFEN 600 MG PO TABS
600.0000 mg | ORAL_TABLET | Freq: Four times a day (QID) | ORAL | Status: DC
  Administered 2012-06-18 – 2012-06-19 (×4): 600 mg via ORAL
  Filled 2012-06-18 (×6): qty 1

## 2012-06-18 MED ORDER — OXYCODONE-ACETAMINOPHEN 5-325 MG PO TABS: 1.0000 | ORAL_TABLET | ORAL | Status: DC | PRN

## 2012-06-18 MED ORDER — ACETAMINOPHEN 325 MG PO TABS: 650.0000 mg | ORAL_TABLET | ORAL | Status: DC | PRN

## 2012-06-18 MED ORDER — DIPHENHYDRAMINE HCL 25 MG PO CAPS: 25.0000 mg | ORAL_CAPSULE | Freq: Four times a day (QID) | ORAL | Status: DC | PRN

## 2012-06-18 MED ORDER — EPHEDRINE 5 MG/ML INJ
10.0000 mg | INTRAVENOUS | Status: DC | PRN
  Filled 2012-06-18: qty 4

## 2012-06-18 MED ORDER — CITRIC ACID-SODIUM CITRATE 334-500 MG/5ML PO SOLN: 30.0000 mL | ORAL | Status: DC | PRN

## 2012-06-18 MED ORDER — LACTATED RINGERS IV SOLN: 500.0000 mL | INTRAVENOUS | Status: DC | PRN

## 2012-06-18 MED ORDER — EPHEDRINE 5 MG/ML INJ: 10.0000 mg | INTRAVENOUS | Status: DC | PRN

## 2012-06-18 MED ORDER — ZOLPIDEM TARTRATE 5 MG PO TABS: 5.0000 mg | ORAL_TABLET | Freq: Every evening | ORAL | Status: DC | PRN

## 2012-06-18 MED ORDER — OXYTOCIN BOLUS FROM INFUSION
500.0000 mL | INTRAVENOUS | Status: DC
  Administered 2012-06-18: 500 mL via INTRAVENOUS

## 2012-06-18 NOTE — Anesthesia Procedure Notes (Signed)
Epidural Patient location during procedure: OB Start time: 06/18/2012 1:55 AM  Staffing Anesthesiologist: Angus Seller., Harrell Gave. Performed by: anesthesiologist   Preanesthetic Checklist Completed: patient identified, site marked, surgical consent, pre-op evaluation, timeout performed, IV checked, risks and benefits discussed and monitors and equipment checked  Epidural Patient position: sitting Prep: site prepped and draped and DuraPrep Patient monitoring: continuous pulse ox and blood pressure Approach: midline Injection technique: LOR air and LOR saline  Needle:  Needle type: Tuohy  Needle gauge: 17 G Needle length: 9 cm and 9 Needle insertion depth: 6 cm Catheter type: closed end flexible Catheter size: 19 Gauge Catheter at skin depth: 12 cm Test dose: negative  Assessment Events: blood not aspirated, injection not painful, no injection resistance, negative IV test and no paresthesia  Additional Notes Patient identified.  Risk benefits discussed including failed block, incomplete pain control, headache, nerve damage, paralysis, blood pressure changes, nausea, vomiting, reactions to medication both toxic or allergic, and postpartum back pain.  Patient expressed understanding and wished to proceed.  All questions were answered.  Sterile technique used throughout procedure and epidural site dressed with sterile barrier dressing. No paresthesia or other complications noted.The patient did not experience any signs of intravascular injection such as tinnitus or metallic taste in mouth nor signs of intrathecal spread such as rapid motor block. Please see nursing notes for vital signs.

## 2012-06-18 NOTE — Anesthesia Postprocedure Evaluation (Signed)
  Anesthesia Post-op Note  Patient: Jordan Hall  Procedure(s) Performed: * No procedures listed *  Patient Location: PACU and Mother/Baby  Anesthesia Type:Epidural  Level of Consciousness: awake, alert  and oriented  Airway and Oxygen Therapy: Patient Spontanous Breathing  Post-op Pain: mild  Post-op Assessment: Patient's Cardiovascular Status Stable, Respiratory Function Stable, Patent Airway and No signs of Nausea or vomiting  Post-op Vital Signs: stable  Complications: No apparent anesthesia complications

## 2012-06-18 NOTE — Anesthesia Preprocedure Evaluation (Signed)

## 2012-06-18 NOTE — H&P (Signed)
Jordan Hall is a 27 y.o. female presenting for contractions since 2000 yesterday. Denies vaginal bleeding or leaking fluid. Good fetal movement.   [facility], pt reports regular care but records only available up to 16 week visit. Pt denies gestational diabetes, hypertension, substance abuse,or other pregnancy complications. States GBS swab was not done.   Previous pregnancy in 2006 significant for macrosomia (10 pounds) born vaginally with 3rd degree laceration, but no shoulder dystocia. Subsequent pregnancy had AGA sized infant. Only one of these children is currently living, and she declined to discuss anything about the  death.   Maternal Medical History:  Reason for admission: Contractions.  Nausea.  Contractions: Onset was 3-5 hours ago.   Frequency: regular.   Duration is approximately 1 minute.   Perceived severity is moderate.    Fetal activity: Perceived fetal activity is normal.   Last perceived fetal movement was within the past hour.    Prenatal complications: No bleeding, hypertension, infection, pre-eclampsia or substance abuse.   Prenatal Complications - Diabetes: none.    OB History   Grav Para Term Preterm Abortions TAB SAB Ect Mult Living   6 2 2  3 3    1      Past Medical History  Diagnosis Date  . Hx of chlamydia infection   . Abnormal Pap smear    Past Surgical History  Procedure Laterality Date  . Colposcopy     Family History: family history includes Asthma in her daughter; Hypertension in her maternal aunt, maternal grandmother, and mother; and Thyroid disease in her maternal grandmother and mother. Social History:  reports that she quit smoking about 3 months ago. Her smoking use included Cigarettes. She smoked 0.25 packs per day. She does not have any smokeless tobacco history on file. She reports that she does not drink alcohol or use illicit drugs.   Prenatal Transfer Tool  Maternal Diabetes: No Genetic Screening:  Normal Maternal Ultrasounds/Referrals: Normal Fetal Ultrasounds or other Referrals:  None Maternal Substance Abuse:  No Significant Maternal Medications:  None Significant Maternal Lab Results:  Lab values include: Other:  Other Comments:  GBS unknown, PCR pending. Genetic screening, diabetes, and ultrasound results not available-information is per patient  Review of Systems  Constitutional: Negative for fever and chills.  Eyes: Negative for blurred vision.  Gastrointestinal: Negative for nausea and vomiting.  Neurological: Negative for headaches.    Dilation: 6.5 Effacement (%): 80 Station: -3 Exam by:: D Nelson RN Blood pressure 128/73, pulse 91, temperature 99 F (37.2 C), temperature source Oral, resp. rate 18, last menstrual period 09/09/2011. Maternal Exam:  Uterine Assessment: Contraction strength is moderate.  Contraction duration is 1 minute. Contraction frequency is regular.   Abdomen: Fetal presentation: vertex  Pelvis: adequate for delivery.   Cervix: Cervix evaluated by digital exam.     Physical Exam  Constitutional: She is oriented to person, place, and time. She appears well-developed and well-nourished. She appears distressed.  Cardiovascular: Normal rate and regular rhythm.   Respiratory: Effort normal and breath sounds normal. No respiratory distress.  Genitourinary:  6.5cm with bulging bag of water per RN exam  Musculoskeletal: Normal range of motion. She exhibits no edema.  Neurological: She is alert and oriented to person, place, and time.  Psychiatric: She has a normal mood and affect. Her behavior is normal.    Prenatal labs: ABO, Rh: O/Positive/-- (08/21 0000) Antibody: Negative (08/21 0000) Rubella: Immune (08/21 0000) RPR: Nonreactive (08/21 0000)  HBsAg: Negative (08/21 0000)  HIV: Non-reactive (08/21 0000)  GBS:   unknown, PCR pending  Assessment/Plan: 1. [id number] at 40.3 in active labor 2. GBS unknown 3. History of macrosomia  -admit  to l&d, may have epidural as desired -GBS PCR -EFW 8 pounds   Alene Mires 06/18/2012, 1:25 AM  I have seen and examined this patient and I agree with the above. GBS negative. Cam Hai 3:03 AM 06/18/2012

## 2012-06-18 NOTE — MAU Note (Signed)
Pt states started having uc's at 2030pm last night. Denies leaking of fluid

## 2012-06-19 MED ORDER — IBUPROFEN 600 MG PO TABS: 600.0000 mg | ORAL_TABLET | Freq: Four times a day (QID) | ORAL | Status: DC | PRN

## 2012-06-19 NOTE — Clinical Social Work Maternal (Signed)
Clinical Social Work Department PSYCHOSOCIAL ASSESSMENT - MATERNAL/CHILD 06/19/2012  Patient:  Jordan Hall, TRIMARCO  Account Number:  [id number]  Admit Date:  06/18/2012  Marjo Bicker Name:   Trudie Reed    Clinical Social Worker:  Lulu Riding, LCSW   Date/Time:  06/19/2012 01:45 PM  Date Referred:  06/19/2012   Referral source  CN     Referred reason  Other - See comment   Other referral source:   History of loss    I:  FAMILY / HOME ENVIRONMENT Child's legal guardian:  PARENT  Guardian - Name Guardian - Age Guardian - Address  Jordan Hall [address] [place], [place] [address]  Jordan Hall  [address] [place], [place]   Other household support members/support persons Name Relationship DOB  Jordan Hall DAUGHTER 02/05/05  Jordan Hall MOTHER   Helayne [place]in[place] STEPFATHER    Other support:   [name] states FOB is involved and supportive and talked about supportive brothers/families as well.    II  PSYCHOSOCIAL DATA Information Source:  Patient Interview  Event organiser Employment:   Financial resources:  [employer] If [employer] - County:    School / Grade:   Maternity Care Coordinator / Child Services Coordination / Early Interventions:   CSW made CC4C referral  Cultural issues impacting care:   None stated    III  STRENGTHS Strengths  Adequate Resources  Compliance with medical plan  Home prepared for Child (including basic supplies)  Other - See comment  Supportive family/friends   Strength comment:  P[facility]   IV  RISK FACTORS AND CURRENT PROBLEMS Current Problem:  None   Risk Factor & Current Problem Patient Issue Family Issue Risk Factor / Current Problem Comment  Other - See comment Y N history of death of a child   N N     V  SOCIAL WORK ASSESSMENT  CSW received call from CN staff stating concerns about a loss [name] does not want to talk about.  CSW initially that [name] did not have to discuss this if  she did not want to, but spoke with Dr. Mickle Plumb for further information.  CSW learned that [name] did not initially disclose the pregnancy of the child who died to her pediatrician and that she informed Dr. Manson Passey that the child did not die from SIDS or anything hereditary.  CSW is concerned.  CSW will not be able to verify information from CPS since [name] did not live in this area when that child was born or passed away.  CSW met with parents in [name]'s first floor room/127 to complete assessment.  FOB was present, but not involved in the conversation.  Bonding between [name] and baby is evident.  She was holding, rubbing and kissing baby throughout assessment.  She states she moved to [place] from [place] because her mother and stepfather moved here.  Her brothers had moved here prior to that.  She states she has been living with her parents in [place] for about a year.  She states FOB lives in [place] and she uses his address for her mail, but she and her children reside in [place].  She states she has a 25 year old, Jordan Hall, who is currently being cared for by a family friend.  When CSW asked if [name] typically lives with [name], [name] looked at CSW strangely and said yes.  CSW asked [name] about her losses, especially in regards to PPD.  CSW discussed signs  and symptoms of PPD and gave Feelings After Birth handout.  [name] states she feels comfortable calling her doctor if symptoms arise.  CSW asked [name] if all of her losses were during pregnancy and she told CSW that she had an 74 month old son pass away in 2007.  CSW asked if she would feel comfortable telling CSW more about the situation.  She initially said she did not want to talk about it and CSW validated her feelings.  She offered that the child did not die from SIDS or anything that could be hereditary.  CSW gently probed further, asking if she and/or the child were in a dangerous environment and if he had been abused by someone.  She said, "no.  Nothing like that!"   CSW paused and apologized for asking [name] to bring up the past, but stated concerns about this situation.  [name] then said that she was giving him a bath and he got water in his mouth and she tried to give him CPR, but he couldn't catch his breath.  She became tearful.  CSW discussed how bath time may be a trigger for her and she states that she has thought about this.  CSW asked if she received counseling after this time and she said yes.  CSW will never know the specifics of the situation, but feels that [name] would probably not have told the truth if it was anything other than an accident.  CSW made CC4C referral and will alert CSW at [facility] to request she offer support to this family.  CSW will contact [facility] CPS on a weekday to ensure they have had no involvement with the family.       VI SOCIAL WORK PLAN  Type of pt/family education:   PPD signs and symptoms/Feelings After Birth handout   If child protective services report - county:   If child protective services report - date:   Information/referral to community resources comment:   CC4C referral   Other social work plan:

## 2012-06-19 NOTE — Discharge Summary (Addendum)
Obstetric Discharge Summary Reason for Admission: onset of labor. Patient presented with spontaneous onset of labor and was admitted at 6 cm. She had a spontaneous vaginal delivery without complications and delivered a viable female infant. She is planning on using IUD for birth control. Patient is currently breast and bottle feeding. Discussed benefits of breastfeeding. Prenatal Procedures: none Intrapartum Procedures: spontaneous vaginal delivery Postpartum Procedures: none Complications-Operative and Postpartum: none Hemoglobin  Date Value Range Status  06/18/2012 13.6  12.0 - 15.0 g/dL Final     HCT  Date Value Range Status  06/18/2012 38.7  36.0 - 46.0 % Final    Physical Exam:  General: alert, cooperative and no distress Lochia: appropriate Uterine Fundus: firm, NT DVT Evaluation: Negative Homan's sign. No cords or calf tenderness. No significant calf/ankle edema.  Discharge Diagnoses: Term Pregnancy-delivered  Discharge Information: Date: 06/19/2012 Activity: pelvic rest Diet: routine Medications: PNV and Ibuprofen Condition: stable Instructions: refer to practice specific booklet Discharge to: home Follow-up Information   Call [facility] [place]. (to inform them that you delivered your baby and to schedule your postpartum appointment)    Contact information:   [address] [place] [address] [phone]      Newborn Data: Live born female  Birth Weight: 7 lb 7 oz (3374 g) APGAR: 8, 9  Home with mother.  CONSTANT,PEGGY 06/19/2012, 7:20 AM

## 2012-06-20 NOTE — Progress Notes (Signed)
Post discharge chart review completed.  

## 2012-06-21 NOTE — Progress Notes (Signed)
[place] CPS confirms no involvement with family.

## 2013-01-25 ENCOUNTER — Emergency Department (HOSPITAL_COMMUNITY)
Admission: EM | Admit: 2013-01-25 | Discharge: 2013-01-25 | Disposition: A | Discharge status: Home / Self Care | Payer: Medicaid Other | Source: Home / Self Care

## 2013-02-07 ENCOUNTER — Encounter (HOSPITAL_COMMUNITY): Payer: Self-pay | Admitting: *Deleted

## 2013-02-07 ENCOUNTER — Inpatient Hospital Stay (HOSPITAL_COMMUNITY)
Admission: AD | Admit: 2013-02-07 | Discharge: 2013-02-07 | Disposition: A | Discharge status: Home / Self Care | Payer: Medicaid Other | Source: Ambulatory Visit | Attending: Obstetrics & Gynecology | Admitting: Obstetrics & Gynecology

## 2013-02-07 ENCOUNTER — Inpatient Hospital Stay (HOSPITAL_COMMUNITY): Payer: Medicaid Other

## 2013-02-07 DIAGNOSIS — O4402 Placenta previa specified as without hemorrhage, second trimester: Secondary | ICD-10-CM

## 2013-02-07 DIAGNOSIS — O209 Hemorrhage in early pregnancy, unspecified: Secondary | ICD-10-CM

## 2013-02-07 DIAGNOSIS — O99330 Smoking (tobacco) complicating pregnancy, unspecified trimester: Secondary | ICD-10-CM | POA: Insufficient documentation

## 2013-02-07 DIAGNOSIS — O0932 Supervision of pregnancy with insufficient antenatal care, second trimester: Secondary | ICD-10-CM

## 2013-02-07 DIAGNOSIS — O0930 Supervision of pregnancy with insufficient antenatal care, unspecified trimester: Secondary | ICD-10-CM | POA: Insufficient documentation

## 2013-02-07 DIAGNOSIS — O99891 Other specified diseases and conditions complicating pregnancy: Secondary | ICD-10-CM | POA: Insufficient documentation

## 2013-02-07 LAB — WET PREP, GENITAL
Trich, Wet Prep: NONE SEEN
Yeast Wet Prep HPF POC: NONE SEEN

## 2013-02-07 LAB — URINALYSIS, ROUTINE W REFLEX MICROSCOPIC
Ketones, ur: NEGATIVE mg/dL
Leukocytes, UA: NEGATIVE
Nitrite: NEGATIVE
Protein, ur: NEGATIVE mg/dL
Urobilinogen, UA: 0.2 mg/dL (ref 0.0–1.0)

## 2013-02-07 LAB — CBC
MCHC: 34.9 g/dL (ref 30.0–36.0)
Platelets: 259 10*3/uL (ref 150–400)
RDW: 14 % (ref 11.5–15.5)
WBC: 9.6 10*3/uL (ref 4.0–10.5)

## 2013-02-07 LAB — OB RESULTS CONSOLE GC/CHLAMYDIA
Chlamydia: NEGATIVE
GC PROBE AMP, GENITAL: NEGATIVE

## 2013-02-07 NOTE — MAU Note (Addendum)
Found out preg, but wasn't going to "fool with it".  Now feeling movement and kicking, first noted yesterday..  Wanting to know how far along she is.  Has been having periods, she thought.

## 2013-02-07 NOTE — MAU Provider Note (Signed)
History     CSN: [id number]  Arrival date and time: 02/07/13 0849   None     Chief Complaint  Patient presents with  . Possible Pregnancy   Possible Pregnancy Associated symptoms include nausea. Pertinent negatives include no abdominal pain, chest pain, chills, fever, headaches or vomiting.    Jordan Hall is a 27 y/o [id number] who presents today for suspected pregnancy and feeling fetal movement.  She is s/p NSVD 7 months ago, and reports no contraception use since.  She is sexually active with 1 partner for the past 3 years. She is O (+) and GBS (-).  UPT (+) today.  She is a current everyday smoker, ~1/2 pack per day, and used alcohol occasionally before she realized she was pregnant. She denies any other drug use, has NKDA but is allergic to latex, and is not taking any medications other than tums for heartburn.   Jordan Hall reports that she took a home pregnancy test about 2 weeks ago, which was (+).  She reports that her LMP was in the last week of September and it was regular.  She originally was thinking about terminating the pregnancy.  However, about 2-3 days ago, she reports that she began to experience what felt like fetal movement in her lower abdomen. This was a surprise to her as she reports having normal menses since her son was born 7 mo ago.   Given that she thought her LMP was about 1 month ago, she was immediately concerned that her pregnancy was farther along than she originally thought.  She does report N without vomiting, heartburn, and an increased appetite.  Jordan Hall does report a small amount of brownish bloody discharge today, that is normal before she begins menses. She notices it when she urinates and when she wipes. She denies any HA, CP, SOB, vomiting, diarrhea, constipation, contractions, vaginal fluid leakage, dysuria, fever, chills, or weight gain.   She now does not want to terminate, and has not received any prenatal care yet. Jordan Hall did take PNV for  the past 2-3 days when she noticed movement, but has run out. She would like a referral to the [facility] for care. She has a hx of 1 stillborn baby and 2 viable babies via NSVD.  She denies any complications with either of her previous term babies. Although she does report mild spotting that occurred in the 1st trimester of her past pregnancy.   OB History   Grav Para Term Preterm Abortions TAB SAB Ect Mult Living   7 3 3  3 3    2       Past Medical History  Diagnosis Date  . Hx of chlamydia infection   . Abnormal Pap smear     Past Surgical History  Procedure Laterality Date  . Colposcopy      Family History  Problem Relation Age of Onset  . Asthma Daughter   . Hypertension Mother   . Thyroid disease Mother   . Hypertension Maternal Aunt   . Hypertension Maternal Grandmother   . Thyroid disease Maternal Grandmother     History  Substance Use Topics  . Smoking status: Current Some Day Smoker -- 0.25 packs/day    Types: Cigarettes    Last Attempt to Quit: 02/26/2012  . Smokeless tobacco: Not on file  . Alcohol Use: No     Comment: occasionally    Allergies:  Allergies  Allergen Reactions  . Latex Rash    Prescriptions  prior to admission  Medication Sig Dispense Refill  . acetaminophen (TYLENOL) 325 MG tablet Take 325 mg by mouth every 6 (six) hours as needed for pain.      . Prenatal Vit-Fe Fumarate-FA (PRENATAL MULTIVITAMIN) TABS Take 1 tablet by mouth daily.        Review of Systems  Constitutional: Negative for fever and chills.       Increased appetite.   Eyes: Negative for blurred vision.  Respiratory: Negative for shortness of breath.   Cardiovascular: Negative for chest pain.  Gastrointestinal: Positive for heartburn and nausea. Negative for vomiting, abdominal pain, diarrhea, constipation and blood in stool.       Heartburn controlled by tums. Nausea without vomiting.  Genitourinary: Negative for dysuria.  Neurological: Negative for  headaches.   Physical Exam   Blood pressure 114/66, pulse 79, temperature 98.1 F (36.7 C), temperature source Oral, resp. rate 18, height 5' 3.5" (1.613 m), weight 71.215 kg (157 lb).  Physical Exam  Constitutional: She is oriented to person, place, and time. She appears well-developed and well-nourished. No distress.  HENT:  Head: Normocephalic.  Neck: Neck supple.  Cardiovascular: Normal rate, regular rhythm, normal heart sounds and intact distal pulses.  Exam reveals no gallop and no friction rub.   No murmur heard. Respiratory: Effort normal and breath sounds normal. She has no wheezes.  GI: Bowel sounds are normal. She exhibits distension. There is no tenderness.  Mild lower abdominal distension.  Uterus fundus palpable below umbilicus, but superior to pubic symphysis.   Musculoskeletal: Normal range of motion.  Neurological: She is alert and oriented to person, place, and time.  Psychiatric: She has a normal mood and affect.    MAU Course  Procedures  MDM FHT documented   Results for orders placed during the hospital encounter of 02/07/13 (from the past 24 hour(s))  URINALYSIS, ROUTINE W REFLEX MICROSCOPIC     Status: None   Collection Time    02/07/13  9:05 AM      Result Value Range   Color, Urine YELLOW  YELLOW   APPearance CLEAR  CLEAR   Specific Gravity, Urine 1.025  1.005 - 1.030   pH 6.5  5.0 - 8.0   Glucose, UA NEGATIVE  NEGATIVE mg/dL   Hgb urine dipstick NEGATIVE  NEGATIVE   Bilirubin Urine NEGATIVE  NEGATIVE   Ketones, ur NEGATIVE  NEGATIVE mg/dL   Protein, ur NEGATIVE  NEGATIVE mg/dL   Urobilinogen, UA 0.2  0.0 - 1.0 mg/dL   Nitrite NEGATIVE  NEGATIVE   Leukocytes, UA NEGATIVE  NEGATIVE  CBC     Status: Abnormal   Collection Time    02/07/13 10:47 AM      Result Value Range   WBC 9.6  4.0 - 10.5 K/uL   RBC 3.84 (*) 3.87 - 5.11 MIL/uL   Hemoglobin 12.4  12.0 - 15.0 g/dL   HCT 91.4 (*) 78.2 - 95.6 %   MCV 92.4  78.0 - 100.0 fL   MCH 32.3  26.0 -  34.0 pg   MCHC 34.9  30.0 - 36.0 g/dL   RDW 21.3  08.6 - 57.8 %   Platelets 259  150 - 400 K/uL  WET PREP, GENITAL     Status: Abnormal   Collection Time    02/07/13 12:20 PM      Result Value Range   Yeast Wet Prep HPF POC NONE SEEN  NONE SEEN   Trich, Wet Prep NONE SEEN  NONE SEEN  Clue Cells Wet Prep HPF POC NONE SEEN  NONE SEEN   WBC, Wet Prep HPF POC FEW (*) NONE SEEN  discussed with Dr. Jolayne Panther- will have pt f/u with anatomy scan and pt plans to go to [facility] Assessment and Plan  A:late to prenatal care  [redacted]w[redacted]d Anterior placeta previa  P:US anatomy scan to be scheduled- order to [place] F/u for prenatal care at [facility] [place] 02/07/2013, 10:11 AM

## 2013-02-07 NOTE — MAU Note (Signed)
Vag delivery on March 8, pt states she had bleeding every month & thought she was having periods.  Was having nausea, did HPT 2 weeks ago which was positive.

## 2013-02-08 LAB — GC/CHLAMYDIA PROBE AMP: GC Probe RNA: NEGATIVE

## 2013-02-23 ENCOUNTER — Encounter: Payer: Self-pay | Admitting: Obstetrics and Gynecology

## 2013-02-23 ENCOUNTER — Ambulatory Visit (INDEPENDENT_AMBULATORY_CARE_PROVIDER_SITE_OTHER): Payer: Medicaid Other | Admitting: Obstetrics and Gynecology

## 2013-02-23 VITALS — BP 115/73 | Temp 97.5°F | Wt 162.5 lb

## 2013-02-23 DIAGNOSIS — O0990 Supervision of high risk pregnancy, unspecified, unspecified trimester: Secondary | ICD-10-CM | POA: Insufficient documentation

## 2013-02-23 DIAGNOSIS — O09892 Supervision of other high risk pregnancies, second trimester: Secondary | ICD-10-CM

## 2013-02-23 DIAGNOSIS — Z8759 Personal history of other complications of pregnancy, childbirth and the puerperium: Secondary | ICD-10-CM

## 2013-02-23 DIAGNOSIS — O4402 Placenta previa specified as without hemorrhage, second trimester: Secondary | ICD-10-CM

## 2013-02-23 DIAGNOSIS — O0992 Supervision of high risk pregnancy, unspecified, second trimester: Secondary | ICD-10-CM

## 2013-02-23 DIAGNOSIS — O09299 Supervision of pregnancy with other poor reproductive or obstetric history, unspecified trimester: Secondary | ICD-10-CM

## 2013-02-23 DIAGNOSIS — O09292 Supervision of pregnancy with other poor reproductive or obstetric history, second trimester: Secondary | ICD-10-CM

## 2013-02-23 DIAGNOSIS — O4400 Placenta previa specified as without hemorrhage, unspecified trimester: Secondary | ICD-10-CM | POA: Insufficient documentation

## 2013-02-23 DIAGNOSIS — O09899 Supervision of other high risk pregnancies, unspecified trimester: Secondary | ICD-10-CM

## 2013-02-23 LAB — POCT URINALYSIS DIP (DEVICE)
Leukocytes, UA: NEGATIVE
Protein, ur: NEGATIVE mg/dL
Urobilinogen, UA: 0.2 mg/dL (ref 0.0–1.0)

## 2013-02-23 NOTE — Progress Notes (Signed)
   Subjective:    Jordan Hall is a [id number] [redacted]w[redacted]d being seen today for her first obstetrical visit.  Her obstetrical history is significant for short interval pregnancy (last baby 06/2012), h/o macrosomia with subsequent AGA infants, late to care. Patient does intend to breast feed. Pregnancy history fully reviewed.  Patient reports no complaints.  Filed Vitals:   02/23/13 1035  BP: 115/73  Temp: 97.5 F (36.4 C)  Weight: 162 lb 8 oz (73.71 kg)    HISTORY: OB History  Gravida Para Term Preterm AB SAB TAB Ectopic Multiple Living  7 2 2  0 4 1 3  0 0 2    # Outcome Date GA Lbr Len/2nd Weight Sex Delivery Anes PTL Lv  7 CUR           6 TRM 06/18/12 [redacted]w[redacted]d 06:19 / 00:09 7 lb 7 oz (3.374 kg) M SVD EPI  Y  5 TAB 2013          4 TAB 2010          3 TAB 2008          2 TRM 2006 [redacted]w[redacted]d  10 lb 5 oz (4.678 kg) F SVD None  Y  1 SAB              Past Medical History  Diagnosis Date  . Hx of chlamydia infection   . Abnormal Pap smear    Past Surgical History  Procedure Laterality Date  . Colposcopy     Family History  Problem Relation Age of Onset  . Asthma Daughter   . Hypertension Mother   . Thyroid disease Mother   . Hypertension Maternal Aunt   . Hypertension Maternal Grandmother   . Thyroid disease Maternal Grandmother      Exam   Patient in a hurry to return to work and wants exam next week                                                                                   Assessment:    Pregnancy: [id number] Patient Active Problem List   Diagnosis Date Noted  . Supervision of high-risk pregnancy 02/23/2013  . Placenta previa antepartum 02/23/2013  . H/O macrosomia in infant in prior pregnancy, currently pregnant 06/14/2012  . Smoker 06/14/2012  . 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 Prenatal vitamins. Problem list reviewed and updated. Genetic Screening discussed [place]  Screen: requested.  Ultrasound discussed; fetal survey: ordered. Physical exam at next visit  Follow up in 4 weeks. 50% of 30 min visit spent on counseling and coordination of care.     Duron Meister 02/23/2013

## 2013-02-23 NOTE — Patient Instructions (Signed)
Second Trimester of Pregnancy The second trimester is from week 13 through week 28, months 4 through 6. The second trimester is often a time when you feel your best. Your body has also adjusted to being pregnant, and you begin to feel better physically. Usually, morning sickness has lessened or quit completely, you may have more energy, and you may have an increase in appetite. The second trimester is also a time when the fetus is growing rapidly. At the end of the sixth month, the fetus is about 9 inches long and weighs about 1 pounds. You will likely begin to feel the baby move (quickening) between 18 and 20 weeks of the pregnancy. BODY CHANGES Your body goes through many changes during pregnancy. The changes vary from woman to woman.   Your weight will continue to increase. You will notice your lower abdomen bulging out.  You may begin to get stretch marks on your hips, abdomen, and breasts.  You may develop headaches that can be relieved by medicines approved by your caregiver.  You may urinate more often because the fetus is pressing on your bladder.  You may develop or continue to have heartburn as a result of your pregnancy.  You may develop constipation because certain hormones are causing the muscles that push waste through your intestines to slow down.  You may develop hemorrhoids or swollen, bulging veins (varicose veins).  You may have back pain because of the weight gain and pregnancy hormones relaxing your joints between the bones in your pelvis and as a result of a shift in weight and the muscles that support your balance.  Your breasts will continue to grow and be tender.  Your gums may bleed and may be sensitive to brushing and flossing.  Dark spots or blotches (chloasma, mask of pregnancy) may develop on your face. This will likely fade after the baby is born.  A dark line from your belly button to the pubic area (linea nigra) may appear. This will likely fade after  the baby is born. WHAT TO EXPECT AT YOUR PRENATAL VISITS During a routine prenatal visit:  You will be weighed to make sure you and the fetus are growing normally.  Your blood pressure will be taken.  Your abdomen will be measured to track your baby's growth.  The fetal heartbeat will be listened to.  Any test results from the previous visit will be discussed. Your caregiver may ask you:  How you are feeling.  If you are feeling the baby move.  If you have had any abnormal symptoms, such as leaking fluid, bleeding, severe headaches, or abdominal cramping.  If you have any questions. Other tests that may be performed during your second trimester include:  Blood tests that check for:  Low iron levels (anemia).  Gestational diabetes (between 24 and 28 weeks).  Rh antibodies.  Urine tests to check for infections, diabetes, or protein in the urine.  An ultrasound to confirm the proper growth and development of the baby.  An amniocentesis to check for possible genetic problems.  Fetal screens for spina bifida and Down syndrome. HOME CARE INSTRUCTIONS   Avoid all smoking, herbs, alcohol, and unprescribed drugs. These chemicals affect the formation and growth of the baby.  Follow your caregiver's instructions regarding medicine use. There are medicines that are either safe or unsafe to take during pregnancy.  Exercise only as directed by your caregiver. Experiencing uterine cramps is a good sign to stop exercising.  Continue to eat regular,  healthy meals.  Wear a good support bra for breast tenderness.  Do not use hot tubs, steam rooms, or saunas.  Wear your seat belt at all times when driving.  Avoid raw meat, uncooked cheese, cat litter boxes, and soil used by cats. These carry germs that can cause birth defects in the baby.  Take your prenatal vitamins.  Try taking a stool softener (if your caregiver approves) if you develop constipation. Eat more high-fiber  foods, such as fresh vegetables or fruit and whole grains. Drink plenty of fluids to keep your urine clear or pale yellow.  Take warm sitz baths to soothe any pain or discomfort caused by hemorrhoids. Use hemorrhoid cream if your caregiver approves.  If you develop varicose veins, wear support hose. Elevate your feet for 15 minutes, 3 4 times a day. Limit salt in your diet.  Avoid heavy lifting, wear low heel shoes, and practice good posture.  Rest with your legs elevated if you have leg cramps or low back pain.  Visit your dentist if you have not gone yet during your pregnancy. Use a soft toothbrush to brush your teeth and be gentle when you floss.  A sexual relationship may be continued unless your caregiver directs you otherwise.  Continue to go to all your prenatal visits as directed by your caregiver. SEEK MEDICAL CARE IF:   You have dizziness.  You have mild pelvic cramps, pelvic pressure, or nagging pain in the abdominal area.  You have persistent nausea, vomiting, or diarrhea.  You have a bad smelling vaginal discharge.  You have pain with urination. SEEK IMMEDIATE MEDICAL CARE IF:   You have a fever.  You are leaking fluid from your vagina.  You have spotting or bleeding from your vagina.  You have severe abdominal cramping or pain.  You have rapid weight gain or loss.  You have shortness of breath with chest pain.  You notice sudden or extreme swelling of your face, hands, ankles, feet, or legs.  You have not felt your baby move in over an hour.  You have severe headaches that do not go away with medicine.  You have vision changes. Document Released: 03/24/2001 Document Revised: 11/30/2012 Document Reviewed: 05/31/2012 [facility] Patient Information 2014 [place].  Contraception Choices Contraception (birth control) is the use of any methods or devices to prevent pregnancy. Below are some methods to help avoid pregnancy. HORMONAL METHODS    Contraceptive implant This is a thin, plastic tube containing progesterone hormone. It does not contain estrogen hormone. Your health care provider inserts the tube in the inner part of the upper arm. The tube can remain in place for up to 3 years. After 3 years, the implant must be removed. The implant prevents the ovaries from releasing an egg (ovulation), thickens the cervical mucus to prevent sperm from entering the uterus, and thins the lining of the inside of the uterus.  Progesterone-only injections These injections are given every 3 months by your health care provider to prevent pregnancy. This synthetic progesterone hormone stops the ovaries from releasing eggs. It also thickens cervical mucus and changes the uterine lining. This makes it harder for sperm to survive in the uterus.  Birth control pills These pills contain estrogen and progesterone hormone. They work by preventing the ovaries from releasing eggs (ovulation). They also cause the cervical mucus to thicken, preventing the sperm from entering the uterus. Birth control pills are prescribed by a health care provider.Birth control pills can also be used to  treat heavy periods.  Minipill This type of birth control pill contains only the progesterone hormone. They are taken every day of each month and must be prescribed by your health care provider.  Birth control patch The patch contains hormones similar to those in birth control pills. It must be changed once a week and is prescribed by a health care provider.  Vaginal ring The ring contains hormones similar to those in birth control pills. It is left in the vagina for 3 weeks, removed for 1 week, and then a new one is put back in place. The patient must be comfortable inserting and removing the ring from the vagina.A health care provider's prescription is necessary.  Emergency contraception Emergency contraceptives prevent pregnancy after unprotected sexual intercourse. This pill  can be taken right after sex or up to 5 days after unprotected sex. It is most effective the sooner you take the pills after having sexual intercourse. Most emergency contraceptive pills are available without a prescription. Check with your pharmacist. Do not use emergency contraception as your only form of birth control. BARRIER METHODS   Female condom This is a thin sheath (latex or rubber) that is worn over the penis during sexual intercourse. It can be used with spermicide to increase effectiveness.  Female condom. This is a soft, loose-fitting sheath that is put into the vagina before sexual intercourse.  Diaphragm This is a soft, latex, dome-shaped barrier that must be fitted by a health care provider. It is inserted into the vagina, along with a spermicidal jelly. It is inserted before intercourse. The diaphragm should be left in the vagina for 6 to 8 hours after intercourse.  Cervical cap This is a round, soft, latex or plastic cup that fits over the cervix and must be fitted by a health care provider. The cap can be left in place for up to 48 hours after intercourse.  Sponge This is a soft, circular piece of polyurethane foam. The sponge has spermicide in it. It is inserted into the vagina after wetting it and before sexual intercourse.  Spermicides These are chemicals that kill or block sperm from entering the cervix and uterus. They come in the form of creams, jellies, suppositories, foam, or tablets. They do not require a prescription. They are inserted into the vagina with an applicator before having sexual intercourse. The process must be repeated every time you have sexual intercourse. INTRAUTERINE CONTRACEPTION  Intrauterine device (IUD) This is a T-shaped device that is put in a woman's uterus during a menstrual period to prevent pregnancy. There are 2 types:  Copper IUD This type of IUD is wrapped in copper wire and is placed inside the uterus. Copper makes the uterus and fallopian  tubes produce a fluid that kills sperm. It can stay in place for 10 years.  Hormone IUD This type of IUD contains the hormone progestin (synthetic progesterone). The hormone thickens the cervical mucus and prevents sperm from entering the uterus, and it also thins the uterine lining to prevent implantation of a fertilized egg. The hormone can weaken or kill the sperm that get into the uterus. It can stay in place for 3 5 years, depending on which type of IUD is used. PERMANENT METHODS OF CONTRACEPTION  Female tubal ligation This is when the woman's fallopian tubes are surgically sealed, tied, or blocked to prevent the egg from traveling to the uterus.  Hysteroscopic sterilization This involves placing a small coil or insert into each fallopian tube. Your doctor uses  a technique called hysteroscopy to do the procedure. The device causes scar tissue to form. This results in permanent blockage of the fallopian tubes, so the sperm cannot fertilize the egg. It takes about 3 months after the procedure for the tubes to become blocked. You must use another form of birth control for these 3 months.  Female sterilization This is when the female has the tubes that carry sperm tied off (vasectomy).This blocks sperm from entering the vagina during sexual intercourse. After the procedure, the man can still ejaculate fluid (semen). NATURAL PLANNING METHODS  Natural family planning This is not having sexual intercourse or using a barrier method (condom, diaphragm, cervical cap) on days the woman could become pregnant.  Calendar method This is keeping track of the length of each menstrual cycle and identifying when you are fertile.  Ovulation method This is avoiding sexual intercourse during ovulation.  Symptothermal method This is avoiding sexual intercourse during ovulation, using a thermometer and ovulation symptoms.  Post ovulation method This is timing sexual intercourse after you have ovulated. Regardless of  which type or method of contraception you choose, it is important that you use condoms to protect against the transmission of sexually transmitted infections (STIs). Talk with your health care provider about which form of contraception is most appropriate for you. Document Released: 03/30/2005 Document Revised: 11/30/2012 Document Reviewed: 09/22/2012 [facility] Patient Information 2014 [place], [place].  Breastfeeding Deciding to breastfeed is one of the best choices you can make for you and your baby. A change in hormones during pregnancy causes your breast tissue to grow and increases the number and size of your milk ducts. These hormones also allow proteins, sugars, and fats from your blood supply to make breast milk in your milk-producing glands. Hormones prevent breast milk from being released before your baby is born as well as prompt milk flow after birth. Once breastfeeding has begun, thoughts of your baby, as well as his or her sucking or crying, can stimulate the release of milk from your milk-producing glands.  BENEFITS OF BREASTFEEDING For Your Baby  Your first milk (colostrum) helps your baby's digestive system function better.   There are antibodies in your milk that help your baby fight off infections.   Your baby has a lower incidence of asthma, allergies, and sudden infant death syndrome.   The nutrients in breast milk are better for your baby than infant formulas and are designed uniquely for your baby's needs.   Breast milk improves your baby's brain development.   Your baby is less likely to develop other conditions, such as childhood obesity, asthma, or type 2 diabetes mellitus.  For You   Breastfeeding helps to create a very special bond between you and your baby.   Breastfeeding is convenient. Breast milk is always available at the correct temperature and costs nothing.   Breastfeeding helps to burn calories and helps you lose the weight gained during pregnancy.    Breastfeeding makes your uterus contract to its prepregnancy size faster and slows bleeding (lochia) after you give birth.   Breastfeeding helps to lower your risk of developing type 2 diabetes mellitus, osteoporosis, and breast or ovarian cancer later in life. SIGNS THAT YOUR BABY IS HUNGRY Early Signs of Hunger  Increased alertness or activity.  Stretching.  Movement of the head from side to side.  Movement of the head and opening of the mouth when the corner of the mouth or cheek is stroked (rooting).  Increased sucking sounds, smacking lips, cooing,  sighing, or squeaking.  Hand-to-mouth movements.  Increased sucking of fingers or hands. Late Signs of Hunger  Fussing.  Intermittent crying. Extreme Signs of Hunger Signs of extreme hunger will require calming and consoling before your baby will be able to breastfeed successfully. Do not wait for the following signs of extreme hunger to occur before you initiate breastfeeding:   Restlessness.  A loud, strong cry.   Screaming. BREASTFEEDING BASICS Breastfeeding Initiation  Find a comfortable place to sit or lie down, with your neck and back well supported.  Place a pillow or rolled up blanket under your baby to bring him or her to the level of your breast (if you are seated). Nursing pillows are specially designed to help support your arms and your baby while you breastfeed.  Make sure that your baby's abdomen is facing your abdomen.   Gently massage your breast. With your fingertips, massage from your chest wall toward your nipple in a circular motion. This encourages milk flow. You may need to continue this action during the feeding if your milk flows slowly.  Support your breast with 4 fingers underneath and your thumb above your nipple. Make sure your fingers are well away from your nipple and your baby's mouth.   Stroke your baby's lips gently with your finger or nipple.   When your baby's mouth is open  wide enough, quickly bring your baby to your breast, placing your entire nipple and as much of the colored area around your nipple (areola) as possible into your baby's mouth.   More areola should be visible above your baby's upper lip than below the lower lip.   Your baby's tongue should be between his or her lower gum and your breast.   Ensure that your baby's mouth is correctly positioned around your nipple (latched). Your baby's lips should create a seal on your breast and be turned out (everted).  It is common for your baby to suck about 2 3 minutes in order to start the flow of breast milk. Latching Teaching your baby how to latch on to your breast properly is very important. An improper latch can cause nipple pain and decreased milk supply for you and poor weight gain in your baby. Also, if your baby is not latched onto your nipple properly, he or she may swallow some air during feeding. This can make your baby fussy. Burping your baby when you switch breasts during the feeding can help to get rid of the air. However, teaching your baby to latch on properly is still the best way to prevent fussiness from swallowing air while breastfeeding. Signs that your baby has successfully latched on to your nipple:    Silent tugging or silent sucking, without causing you pain.   Swallowing heard between every 3 4 sucks.    Muscle movement above and in front of his or her ears while sucking.  Signs that your baby has not successfully latched on to nipple:   Sucking sounds or smacking sounds from your baby while breastfeeding.  Nipple pain. If you think your baby has not latched on correctly, slip your finger into the corner of your baby's mouth to break the suction and place it between your baby's gums. Attempt breastfeeding initiation again. Signs of Successful Breastfeeding Signs from your baby:   A gradual decrease in the number of sucks or complete cessation of sucking.   Falling  asleep.   Relaxation of his or her body.   Retention of a small  amount of milk in his or her mouth.   Letting go of your breast by himself or herself. Signs from you:  Breasts that have increased in firmness, weight, and size 1 3 hours after feeding.   Breasts that are softer immediately after breastfeeding.  Increased milk volume, as well as a change in milk consistency and color by the 5th day of breastfeeding.   Nipples that are not sore, cracked, or bleeding. Signs That Your Pecola Leisure is Getting Enough Milk  Wetting at least 3 diapers in a 24-hour period. The urine should be clear and pale yellow by age 61 days.  At least 3 stools in a 24-hour period by age 61 days. The stool should be soft and yellow.  At least 3 stools in a 24-hour period by age 14 days. The stool should be seedy and yellow.  No loss of weight greater than 10% of birth weight during the first 85 days of age.  Average weight gain of 4 7 ounces (120 210 mL) per week after age 31 days.  Consistent daily weight gain by age 61 days, without weight loss after the age of 2 weeks. After a feeding, your baby may spit up a small amount. This is common. BREASTFEEDING FREQUENCY AND DURATION Frequent feeding will help you make more milk and can prevent sore nipples and breast engorgement. Breastfeed when you feel the need to reduce the fullness of your breasts or when your baby shows signs of hunger. This is called "breastfeeding on demand." Avoid introducing a pacifier to your baby while you are working to establish breastfeeding (the first 4 6 weeks after your baby is born). After this time you may choose to use a pacifier. Research has shown that pacifier use during the first year of a baby's life decreases the risk of sudden infant death syndrome (SIDS). Allow your baby to feed on each breast as long as he or she wants. Breastfeed until your baby is finished feeding. When your baby unlatches or falls asleep while feeding from  the first breast, offer the second breast. Because newborns are often sleepy in the first few weeks of life, you may need to awaken your baby to get him or her to feed. Breastfeeding times will vary from baby to baby. However, the following rules can serve as a guide to help you ensure that your baby is properly fed:  Newborns (babies 33 weeks of age or younger) may breastfeed every 1 3 hours.  Newborns should not go longer than 3 hours during the day or 5 hours during the night without breastfeeding.  You should breastfeed your baby a minimum of 8 times in a 24-hour period until you begin to introduce solid foods to your baby at around 70 months of age. BREAST MILK PUMPING Pumping and storing breast milk allows you to ensure that your baby is exclusively fed your breast milk, even at times when you are unable to breastfeed. This is especially important if you are going back to work while you are still breastfeeding or when you are not able to be present during feedings. Your lactation consultant can give you guidelines on how long it is safe to store breast milk.  A breast pump is a machine that allows you to pump milk from your breast into a sterile bottle. The pumped breast milk can then be stored in a refrigerator or freezer. Some breast pumps are operated by hand, while others use electricity. Ask your lactation consultant which type  will work best for you. Breast pumps can be purchased, but some hospitals and breastfeeding support groups lease breast pumps on a monthly basis. A lactation consultant can teach you how to hand express breast milk, if you prefer not to use a pump.  CARING FOR YOUR BREASTS WHILE YOU BREASTFEED Nipples can become dry, cracked, and sore while breastfeeding. The following recommendations can help keep your breasts moisturized and healthy:  Avoid using soap on your nipples.   Wear a supportive bra. Although not required, special nursing bras and tank tops are designed to  allow access to your breasts for breastfeeding without taking off your entire bra or top. Avoid wearing underwire style bras or extremely tight bras.  Air dry your nipples for 3 4minutes after each feeding.   Use only cotton bra pads to absorb leaked breast milk. Leaking of breast milk between feedings is normal.   Use lanolin on your nipples after breastfeeding. Lanolin helps to maintain your skin's normal moisture barrier. If you use pure lanolin you do not need to wash it off before feeding your baby again. Pure lanolin is not toxic to your baby. You may also hand express a few drops of breast milk and gently massage that milk into your nipples and allow the milk to air dry. In the first few weeks after giving birth, some women experience extremely full breasts (engorgement). Engorgement can make your breasts feel heavy, warm, and tender to the touch. Engorgement peaks within 3 5 days after you give birth. The following recommendations can help ease engorgement:  Completely empty your breasts while breastfeeding or pumping. You may want to start by applying warm, moist heat (in the shower or with warm water-soaked hand towels) just before feeding or pumping. This increases circulation and helps the milk flow. If your baby does not completely empty your breasts while breastfeeding, pump any extra milk after he or she is finished.  Wear a snug bra (nursing or regular) or tank top for 1 2 days to signal your body to slightly decrease milk production.  Apply ice packs to your breasts, unless this is too uncomfortable for you.  Make sure that your baby is latched on and positioned properly while breastfeeding. If engorgement persists after 48 hours of following these recommendations, contact your health care provider or a Science writer. OVERALL HEALTH CARE RECOMMENDATIONS WHILE BREASTFEEDING  Eat healthy foods. Alternate between meals and snacks, eating 3 of each per day. Because what you  eat affects your breast milk, some of the foods may make your baby more irritable than usual. Avoid eating these foods if you are sure that they are negatively affecting your baby.  Drink milk, fruit juice, and water to satisfy your thirst (about 10 glasses a day).   Rest often, relax, and continue to take your prenatal vitamins to prevent fatigue, stress, and anemia.  Continue breast self-awareness checks.  Avoid chewing and smoking tobacco.  Avoid alcohol and drug use. Some medicines that may be harmful to your baby can pass through breast milk. It is important to ask your health care provider before taking any medicine, including all over-the-counter and prescription medicine as well as vitamin and herbal supplements. It is possible to become pregnant while breastfeeding. If birth control is desired, ask your health care provider about options that will be safe for your baby. SEEK MEDICAL CARE IF:   You feel like you want to stop breastfeeding or have become frustrated with breastfeeding.  You have painful  breasts or nipples.  Your nipples are cracked or bleeding.  Your breasts are red, tender, or warm.  You have a swollen area on either breast.  You have a fever or chills.  You have nausea or vomiting.  You have drainage other than breast milk from your nipples.  Your breasts do not become full before feedings by the 5th day after you give birth.  You feel sad and depressed.  Your baby is too sleepy to eat well.  Your baby is having trouble sleeping.   Your baby is wetting less than 3 diapers in a 24-hour period.  Your baby has less than 3 stools in a 24-hour period.  Your baby's skin or the white part of his or her eyes becomes yellow.   Your baby is not gaining weight by 485 days of age. SEEK IMMEDIATE MEDICAL CARE IF:   Your baby is overly tired (lethargic) and does not want to wake up and feed.  Your baby develops an unexplained fever. Document Released:  03/30/2005 Document Revised: 11/30/2012 Document Reviewed: 09/21/2012 [facility] Patient Information 2014 [employer], [place].

## 2013-02-23 NOTE — Progress Notes (Signed)
Pulse-  95 

## 2013-02-23 NOTE — Progress Notes (Signed)
U/S scheduled 03/02/13 at 8 am.

## 2013-02-24 LAB — PRESCRIPTION MONITORING PROFILE (19 PANEL)
Amphetamine/Meth: NEGATIVE ng/mL
Buprenorphine, Urine: NEGATIVE ng/mL
Carisoprodol, Urine: NEGATIVE ng/mL
Creatinine, Urine: 230.08 mg/dL (ref >20.0)
Methadone Screen, Urine: NEGATIVE ng/mL
Nitrites, Initial: NEGATIVE ug/mL
Oxycodone Screen, Ur: NEGATIVE ng/mL
Propoxyphene: NEGATIVE ng/mL
Tapentadol, urine: NEGATIVE ng/mL

## 2013-02-24 LAB — OBSTETRIC PANEL
Antibody Screen: NEGATIVE
Basophils Relative: 0 % (ref 0–1)
Eosinophils Relative: 1 % (ref 0–5)
HCT: 36.5 % (ref 36.0–46.0)
Hemoglobin: 12.7 g/dL (ref 12.0–15.0)
MCH: 33.3 pg (ref 26.0–34.0)
MCHC: 34.8 g/dL (ref 30.0–36.0)
MCV: 95.8 fL (ref 78.0–100.0)
Monocytes Absolute: 0.6 10*3/uL (ref 0.1–1.0)
Monocytes Relative: 5 % (ref 3–12)
Neutro Abs: 8.1 10*3/uL — ABNORMAL HIGH (ref 1.7–7.7)
Rh Type: POSITIVE

## 2013-02-24 LAB — ALCOHOL METABOLITE (ETG), URINE: Ethyl Glucuronide (EtG): NEGATIVE ng/mL

## 2013-02-25 LAB — CULTURE, OB URINE: Organism ID, Bacteria: 10000

## 2013-02-27 LAB — HEMOGLOBINOPATHY EVALUATION: Hgb A2 Quant: 2.6 % (ref 2.2–3.2)

## 2013-02-28 ENCOUNTER — Encounter: Payer: Self-pay | Admitting: Obstetrics and Gynecology

## 2013-02-28 LAB — AFP, QUAD SCREEN
AFP: 88.7 IU/mL
Curr Gest Age: 22.6 wks.days
Down Syndrome Scr Risk Est: <1:38500 {titer}
Interpretation-AFP: NEGATIVE
MoM for INH: 1.54
uE3 Mom: 0.65
uE3 Value: 1.3 ng/mL

## 2013-03-02 ENCOUNTER — Ambulatory Visit (HOSPITAL_COMMUNITY): Admission: RE | Admit: 2013-03-02 | Payer: Medicaid Other | Source: Ambulatory Visit

## 2013-03-02 ENCOUNTER — Ambulatory Visit (HOSPITAL_COMMUNITY)
Admission: RE | Admit: 2013-03-02 | Discharge: 2013-03-02 | Disposition: A | Discharge status: Home / Self Care | Payer: Medicaid Other | Source: Ambulatory Visit | Attending: Obstetrics and Gynecology | Admitting: Obstetrics and Gynecology

## 2013-03-02 ENCOUNTER — Encounter: Payer: Self-pay | Admitting: *Deleted

## 2013-03-02 ENCOUNTER — Other Ambulatory Visit: Payer: Self-pay | Admitting: Obstetrics and Gynecology

## 2013-03-02 DIAGNOSIS — O0992 Supervision of high risk pregnancy, unspecified, second trimester: Secondary | ICD-10-CM

## 2013-03-02 DIAGNOSIS — O09892 Supervision of other high risk pregnancies, second trimester: Secondary | ICD-10-CM

## 2013-03-02 DIAGNOSIS — O4402 Placenta previa specified as without hemorrhage, second trimester: Secondary | ICD-10-CM

## 2013-03-02 DIAGNOSIS — Z3689 Encounter for other specified antenatal screening: Secondary | ICD-10-CM | POA: Insufficient documentation

## 2013-03-03 ENCOUNTER — Encounter: Payer: Self-pay | Admitting: Obstetrics and Gynecology

## 2013-03-23 ENCOUNTER — Encounter: Payer: Self-pay | Admitting: Family Medicine

## 2013-03-23 ENCOUNTER — Ambulatory Visit (INDEPENDENT_AMBULATORY_CARE_PROVIDER_SITE_OTHER): Payer: Medicaid Other | Admitting: Family Medicine

## 2013-03-23 ENCOUNTER — Other Ambulatory Visit (HOSPITAL_COMMUNITY)
Admission: RE | Admit: 2013-03-23 | Discharge: 2013-03-23 | Disposition: A | Discharge status: Home / Self Care | Payer: Medicaid Other | Source: Ambulatory Visit | Attending: Family Medicine | Admitting: Family Medicine

## 2013-03-23 VITALS — BP 110/71 | Wt 170.6 lb

## 2013-03-23 DIAGNOSIS — O0992 Supervision of high risk pregnancy, unspecified, second trimester: Secondary | ICD-10-CM

## 2013-03-23 DIAGNOSIS — Z7189 Other specified counseling: Secondary | ICD-10-CM

## 2013-03-23 DIAGNOSIS — Z716 Tobacco abuse counseling: Secondary | ICD-10-CM

## 2013-03-23 DIAGNOSIS — Z01419 Encounter for gynecological examination (general) (routine) without abnormal findings: Secondary | ICD-10-CM | POA: Insufficient documentation

## 2013-03-23 DIAGNOSIS — O09292 Supervision of pregnancy with other poor reproductive or obstetric history, second trimester: Secondary | ICD-10-CM

## 2013-03-23 DIAGNOSIS — Z113 Encounter for screening for infections with a predominantly sexual mode of transmission: Secondary | ICD-10-CM | POA: Insufficient documentation

## 2013-03-23 DIAGNOSIS — O99330 Smoking (tobacco) complicating pregnancy, unspecified trimester: Secondary | ICD-10-CM

## 2013-03-23 DIAGNOSIS — O09299 Supervision of pregnancy with other poor reproductive or obstetric history, unspecified trimester: Secondary | ICD-10-CM

## 2013-03-23 LAB — CBC
Hemoglobin: 12.2 g/dL (ref 12.0–15.0)
MCH: 32.7 pg (ref 26.0–34.0)
RBC: 3.73 MIL/uL — ABNORMAL LOW (ref 3.87–5.11)

## 2013-03-23 MED ORDER — BUPROPION HCL ER (SMOKING DET) 150 MG PO TB12: 150.0000 mg | ORAL_TABLET | Freq: Two times a day (BID) | ORAL | Status: DC

## 2013-03-23 NOTE — Progress Notes (Signed)
+  FM, no lof no vb, no ctx.   Jordan Hall is a 28 y.o. [id number] at [redacted]w[redacted]d here for ROB visit.  Discussed with Patient:  -Plans to breast/bottle feed.  All questions answered. -Continue prenatal vitamins. - Reviewed genetics screen (Quad screen).   - Reviewed fetal kick counts (Pt to perform daily at a time when the baby is active, lie laterally with both hands on belly in quiet room and count all movements (hiccups, shoulder rolls, obvious kicks, etc); pt is to report to clinic or MAU for less than 10 movements felt in a one hour time period-pt told as soon as she counts 10 movements the count is complete.)  - Routine precautions discussed (depression, infection s/s).   Patient provided with all pertinent phone numbers for emergencies. - RTC for any VB, regular, painful cramps/ctxs occurring at a rate of >2/10 min, fever (100.5 or higher), n/v/d, any pain that is unresolving or worsening, LOF, decreased fetal movement, CP, SOB, edema  Problems: Patient Active Problem List   Diagnosis Date Noted  . Supervision of high-risk pregnancy 02/23/2013  . Placenta previa antepartum 02/23/2013  . Short interval between pregnancies complicating pregnancy, antepartum 02/23/2013  . History of stillbirth 02/23/2013  . H/O macrosomia in infant in prior pregnancy, currently pregnant 06/14/2012  . Smoker 06/14/2012  . 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 Glucose tolerance test ordered.  Patient will draw in clinic.  Will f/u test and amend plan based on results. 2. CBC and antibody screen ordered. 3. Pap / Gc/c  [ ]  Vaccines: Flu: Declines flu  [ ]  BCM: BTL  Edu: [x ] PTL precautions; [ ]  BF class; [ ]  childbirth class; [ ]   BF counseling;

## 2013-03-23 NOTE — Patient Instructions (Signed)
Second Trimester of Pregnancy The second trimester is from week 13 through week 28, months 4 through 6. The second trimester is often a time when you feel your best. Your body has also adjusted to being pregnant, and you begin to feel better physically. Usually, morning sickness has lessened or quit completely, you may have more energy, and you may have an increase in appetite. The second trimester is also a time when the fetus is growing rapidly. At the end of the sixth month, the fetus is about 9 inches long and weighs about 1 pounds. You will likely begin to feel the baby move (quickening) between 18 and 20 weeks of the pregnancy. BODY CHANGES Your body goes through many changes during pregnancy. The changes vary from woman to woman.   Your weight will continue to increase. You will notice your lower abdomen bulging out.  You may begin to get stretch marks on your hips, abdomen, and breasts.  You may develop headaches that can be relieved by medicines approved by your caregiver.  You may urinate more often because the fetus is pressing on your bladder.  You may develop or continue to have heartburn as a result of your pregnancy.  You may develop constipation because certain hormones are causing the muscles that push waste through your intestines to slow down.  You may develop hemorrhoids or swollen, bulging veins (varicose veins).  You may have back pain because of the weight gain and pregnancy hormones relaxing your joints between the bones in your pelvis and as a result of a shift in weight and the muscles that support your balance.  Your breasts will continue to grow and be tender.  Your gums may bleed and may be sensitive to brushing and flossing.  Dark spots or blotches (chloasma, mask of pregnancy) may develop on your face. This will likely fade after the baby is born.  A dark line from your belly button to the pubic area (linea nigra) may appear. This will likely fade after the  baby is born. WHAT TO EXPECT AT YOUR PRENATAL VISITS During a routine prenatal visit:  You will be weighed to make sure you and the fetus are growing normally.  Your blood pressure will be taken.  Your abdomen will be measured to track your baby's growth.  The fetal heartbeat will be listened to.  Any test results from the previous visit will be discussed. Your caregiver may ask you:  How you are feeling.  If you are feeling the baby move.  If you have had any abnormal symptoms, such as leaking fluid, bleeding, severe headaches, or abdominal cramping.  If you have any questions. Other tests that may be performed during your second trimester include:  Blood tests that check for:  Low iron levels (anemia).  Gestational diabetes (between 24 and 28 weeks).  Rh antibodies.  Urine tests to check for infections, diabetes, or protein in the urine.  An ultrasound to confirm the proper growth and development of the baby.  An amniocentesis to check for possible genetic problems.  Fetal screens for spina bifida and Down syndrome. HOME CARE INSTRUCTIONS   Avoid all smoking, herbs, alcohol, and unprescribed drugs. These chemicals affect the formation and growth of the baby.  Follow your caregiver's instructions regarding medicine use. There are medicines that are either safe or unsafe to take during pregnancy.  Exercise only as directed by your caregiver. Experiencing uterine cramps is a good sign to stop exercising.  Continue to eat regular,   healthy meals.  Wear a good support bra for breast tenderness.  Do not use hot tubs, steam rooms, or saunas.  Wear your seat belt at all times when driving.  Avoid raw meat, uncooked cheese, cat litter boxes, and soil used by cats. These carry germs that can cause birth defects in the baby.  Take your prenatal vitamins.  Try taking a stool softener (if your caregiver approves) if you develop constipation. Eat more high-fiber foods,  such as fresh vegetables or fruit and whole grains. Drink plenty of fluids to keep your urine clear or pale yellow.  Take warm sitz baths to soothe any pain or discomfort caused by hemorrhoids. Use hemorrhoid cream if your caregiver approves.  If you develop varicose veins, wear support hose. Elevate your feet for 15 minutes, 3 4 times a day. Limit salt in your diet.  Avoid heavy lifting, wear low heel shoes, and practice good posture.  Rest with your legs elevated if you have leg cramps or low back pain.  Visit your dentist if you have not gone yet during your pregnancy. Use a soft toothbrush to brush your teeth and be gentle when you floss.  A sexual relationship may be continued unless your caregiver directs you otherwise.  Continue to go to all your prenatal visits as directed by your caregiver. SEEK MEDICAL CARE IF:   You have dizziness.  You have mild pelvic cramps, pelvic pressure, or nagging pain in the abdominal area.  You have persistent nausea, vomiting, or diarrhea.  You have a bad smelling vaginal discharge.  You have pain with urination. SEEK IMMEDIATE MEDICAL CARE IF:   You have a fever.  You are leaking fluid from your vagina.  You have spotting or bleeding from your vagina.  You have severe abdominal cramping or pain.  You have rapid weight gain or loss.  You have shortness of breath with chest pain.  You notice sudden or extreme swelling of your face, hands, ankles, feet, or legs.  You have not felt your baby move in over an hour.  You have severe headaches that do not go away with medicine.  You have vision changes. Document Released: 03/24/2001 Document Revised: 11/30/2012 Document Reviewed: 05/31/2012 ExitCare Patient Information 2014 [employer].  

## 2013-03-23 NOTE — Progress Notes (Signed)
Pulse: 85 Pt reports that she is having migraines.  1hr gtt today Needs pap and cultures today.

## 2013-03-24 LAB — RPR

## 2013-03-28 ENCOUNTER — Encounter: Payer: Self-pay | Admitting: Family Medicine

## 2013-03-28 DIAGNOSIS — IMO0002 Reserved for concepts with insufficient information to code with codable children: Secondary | ICD-10-CM | POA: Insufficient documentation

## 2013-03-29 ENCOUNTER — Telehealth: Payer: Self-pay | Admitting: General Practice

## 2013-03-29 NOTE — Telephone Encounter (Signed)
Called patient and discussed results with her. Patient verbalized understanding and stated this happens a lot to her. Told her our front office staff with contact with her an appt for the colposcopy and that based off what the provider sees they may or may not decide to do biopsies at that time. Patient verbalized understanding and had no further questions

## 2013-03-29 NOTE — Telephone Encounter (Signed)
Message copied by Kathee Delton on Wed Mar 29, 2013 11:47 AM ------      Message from: Jolyn Lent R      Created: Tue Mar 28, 2013  4:24 PM       Pt with LSIL and in need of colpo. Please call and help coordinate.            MRO ------

## 2013-04-02 ENCOUNTER — Encounter (HOSPITAL_COMMUNITY): Payer: Self-pay | Admitting: Emergency Medicine

## 2013-04-02 ENCOUNTER — Observation Stay (HOSPITAL_COMMUNITY)
Admission: EM | Admit: 2013-04-02 | Discharge: 2013-04-03 | Disposition: A | Discharge status: Home / Self Care | Payer: Medicaid Other | Attending: Obstetrics & Gynecology | Admitting: Obstetrics & Gynecology

## 2013-04-02 ENCOUNTER — Observation Stay (HOSPITAL_COMMUNITY): Payer: Medicaid Other

## 2013-04-02 DIAGNOSIS — B9789 Other viral agents as the cause of diseases classified elsewhere: Secondary | ICD-10-CM | POA: Insufficient documentation

## 2013-04-02 DIAGNOSIS — Z8759 Personal history of other complications of pregnancy, childbirth and the puerperium: Secondary | ICD-10-CM

## 2013-04-02 DIAGNOSIS — R059 Cough, unspecified: Secondary | ICD-10-CM | POA: Insufficient documentation

## 2013-04-02 DIAGNOSIS — R05 Cough: Secondary | ICD-10-CM | POA: Insufficient documentation

## 2013-04-02 DIAGNOSIS — O99891 Other specified diseases and conditions complicating pregnancy: Principal | ICD-10-CM | POA: Insufficient documentation

## 2013-04-02 DIAGNOSIS — O9989 Other specified diseases and conditions complicating pregnancy, childbirth and the puerperium: Secondary | ICD-10-CM

## 2013-04-02 DIAGNOSIS — B349 Viral infection, unspecified: Secondary | ICD-10-CM | POA: Diagnosis present

## 2013-04-02 DIAGNOSIS — O0990 Supervision of high risk pregnancy, unspecified, unspecified trimester: Secondary | ICD-10-CM | POA: Insufficient documentation

## 2013-04-02 DIAGNOSIS — O09892 Supervision of other high risk pregnancies, second trimester: Secondary | ICD-10-CM

## 2013-04-02 DIAGNOSIS — J111 Influenza due to unidentified influenza virus with other respiratory manifestations: Secondary | ICD-10-CM

## 2013-04-02 DIAGNOSIS — O4700 False labor before 37 completed weeks of gestation, unspecified trimester: Secondary | ICD-10-CM | POA: Insufficient documentation

## 2013-04-02 DIAGNOSIS — J101 Influenza due to other identified influenza virus with other respiratory manifestations: Secondary | ICD-10-CM | POA: Diagnosis present

## 2013-04-02 DIAGNOSIS — O212 Late vomiting of pregnancy: Secondary | ICD-10-CM | POA: Insufficient documentation

## 2013-04-02 DIAGNOSIS — O0992 Supervision of high risk pregnancy, unspecified, second trimester: Secondary | ICD-10-CM

## 2013-04-02 DIAGNOSIS — R197 Diarrhea, unspecified: Secondary | ICD-10-CM | POA: Insufficient documentation

## 2013-04-02 DIAGNOSIS — O99330 Smoking (tobacco) complicating pregnancy, unspecified trimester: Secondary | ICD-10-CM

## 2013-04-02 LAB — CBC
HCT: 34.2 % — ABNORMAL LOW (ref 36.0–46.0)
Hemoglobin: 12 g/dL (ref 12.0–15.0)
MCHC: 35.1 g/dL (ref 30.0–36.0)
RDW: 13.3 % (ref 11.5–15.5)
WBC: 9.6 10*3/uL (ref 4.0–10.5)

## 2013-04-02 LAB — BASIC METABOLIC PANEL
BUN: 4 mg/dL — ABNORMAL LOW (ref 6–23)
Chloride: 103 mEq/L (ref 96–112)
GFR calc Af Amer: >90 mL/min (ref >90)
GFR calc non Af Amer: >90 mL/min (ref >90)
Glucose, Bld: 89 mg/dL (ref 70–99)
Potassium: 3 mEq/L — ABNORMAL LOW (ref 3.5–5.1)
Sodium: 135 mEq/L (ref 135–145)

## 2013-04-02 MED ORDER — SODIUM CHLORIDE 0.9 % IV BOLUS (SEPSIS)
1000.0000 mL | Freq: Once | INTRAVENOUS | Status: AC
  Administered 2013-04-02: 1000 mL via INTRAVENOUS

## 2013-04-02 MED ORDER — DOCUSATE SODIUM 100 MG PO CAPS
100.0000 mg | ORAL_CAPSULE | Freq: Every day | ORAL | Status: DC
  Filled 2013-04-02: qty 1

## 2013-04-02 MED ORDER — OSELTAMIVIR PHOSPHATE 75 MG PO CAPS
75.0000 mg | ORAL_CAPSULE | Freq: Two times a day (BID) | ORAL | Status: DC
  Administered 2013-04-03 (×3): 75 mg via ORAL
  Filled 2013-04-02 (×4): qty 1

## 2013-04-02 MED ORDER — ALBUTEROL SULFATE (5 MG/ML) 0.5% IN NEBU
5.0000 mg | INHALATION_SOLUTION | Freq: Once | RESPIRATORY_TRACT | Status: AC
  Administered 2013-04-02: 5 mg via RESPIRATORY_TRACT
  Filled 2013-04-02: qty 1

## 2013-04-02 MED ORDER — ZOLPIDEM TARTRATE 5 MG PO TABS
5.0000 mg | ORAL_TABLET | Freq: Every evening | ORAL | Status: DC | PRN
  Filled 2013-04-02: qty 1

## 2013-04-02 MED ORDER — PRENATAL MULTIVITAMIN CH
1.0000 | ORAL_TABLET | Freq: Every day | ORAL | Status: DC
  Administered 2013-04-03: 1 via ORAL
  Filled 2013-04-02: qty 1

## 2013-04-02 MED ORDER — CALCIUM CARBONATE ANTACID 500 MG PO CHEW: 2.0000 | CHEWABLE_TABLET | ORAL | Status: DC | PRN

## 2013-04-02 MED ORDER — GUAIFENESIN 100 MG/5ML PO SYRP
200.0000 mg | ORAL_SOLUTION | ORAL | Status: DC | PRN
  Administered 2013-04-02: 200 mg via ORAL
  Administered 2013-04-03: 13:00:00 via ORAL
  Administered 2013-04-03 (×3): 200 mg via ORAL
  Filled 2013-04-02 (×8): qty 10

## 2013-04-02 MED ORDER — ACETAMINOPHEN 500 MG PO TABS
1000.0000 mg | ORAL_TABLET | Freq: Once | ORAL | Status: AC
  Administered 2013-04-02: 1000 mg via ORAL
  Filled 2013-04-02: qty 2

## 2013-04-02 MED ORDER — ONDANSETRON HCL 4 MG/2ML IJ SOLN
4.0000 mg | Freq: Once | INTRAMUSCULAR | Status: AC
Start: 2013-04-02 — End: 2013-04-02
  Administered 2013-04-02: 4 mg via INTRAVENOUS
  Filled 2013-04-02: qty 2

## 2013-04-02 MED ORDER — ACETAMINOPHEN 325 MG PO TABS
650.0000 mg | ORAL_TABLET | ORAL | Status: DC | PRN
  Administered 2013-04-03 (×4): 650 mg via ORAL
  Filled 2013-04-02 (×4): qty 2

## 2013-04-02 NOTE — ED Notes (Signed)
OB nurse at bedside 

## 2013-04-02 NOTE — Progress Notes (Signed)
Arrived to evaluate this 27 yo G7 P2 @ 28.2 wks with c/o fever, chills, cough, vomiting, low abd pain and back pain.  FHR tachy on EFM placement and UC's noted.   IV started for fluid bolus. See ED orders.

## 2013-04-02 NOTE — ED Notes (Signed)
The pt woke up this am with cold cough chills and fever vomiting  C/o pain all over her body.  Last tylenol was 1000am today.  lmp 6 months preg.  edc match 13th.

## 2013-04-02 NOTE — Progress Notes (Signed)
RROB spoke with Dr Debroah Loop the attending OB at [facility]; told of pt hx, s/s,v/s, treatments/procedures, told of fhr tachy, pt having uc's q2-5.  Dr Debroah Loop will speak with Dr Valarie Cones) and discuss plan of care.

## 2013-04-02 NOTE — ED Notes (Signed)
OB rapid response nurse called 

## 2013-04-02 NOTE — H&P (Signed)
Jordan Hall is a 27 y.o. female [phone] at [redacted]w[redacted]d wks IUP presenting for chills, cough (dry, nonproductive), diarrhea, nausea and vomiting x 24 hours.  Transferred over from [facility] for admission to antenatal unit.  Chest xray - negative.  Influenza PCR pending.  Pt denies vaginal bleeding, leaking of fluid, or contractions.  + fetal movement.  Pt seen in High Risk clinic due to history of term stillbirth.   . Maternal Medical History:  Reason for admission: Nausea (from coughing).     OB History   Grav Para Term Preterm Abortions TAB SAB Ect Mult Living   7 2 2  0 4 3 1  0 0 2     Past Medical History  Diagnosis Date  . Hx of chlamydia infection   . Abnormal Pap smear    Past Surgical History  Procedure Laterality Date  . Colposcopy     Family History: family history includes Asthma in her daughter; Hypertension in her maternal aunt, maternal grandmother, and mother; Thyroid disease in her maternal grandmother and mother. Social History:  reports that she has been smoking Cigarettes.  She has been smoking about 0.25 packs per day. She has never used smokeless tobacco. She reports that she does not drink alcohol or use illicit drugs.   Prenatal Transfer Tool  Maternal Diabetes: No Genetic Screening: Normal Maternal Ultrasounds/Referrals: Normal Fetal Ultrasounds or other Referrals:  None Maternal Substance Abuse:  Yes:  Type: Smoker Significant Maternal Medications:  None Significant Maternal Lab Results:  Lab values include: Other:  Influenza pending Other Comments:  Chest xray negative; hx of stillbirth  Review of Systems  Constitutional: Positive for fever and chills.  HENT: Positive for congestion. Negative for sore throat.   Respiratory: Positive for cough.   Gastrointestinal: Positive for nausea (from coughing), vomiting and diarrhea. Negative for abdominal pain.  Genitourinary: Negative for dysuria, urgency and flank pain.  Musculoskeletal: Positive for  myalgias (body aches). Negative for neck pain.  Skin: Negative for rash.  Neurological: Positive for headaches.    Dilation: Fingertip Effacement (%): Thick Exam by:: [email or web address] Blood pressure 134/40, pulse 118, temperature 98.8 F (37.1 C), temperature source Oral, resp. rate 20, height 5\' 3"  (1.6 m), weight 77.565 kg (171 lb), SpO2 94.00%. Maternal Exam:  Uterine Assessment: Contraction strength is mild.  Contraction frequency is irregular.   Introitus: Vagina is negative for discharge (mucusy).    Fetal Exam Fetal Monitor Review: Baseline rate: 140's.  Variability: moderate (6-25 bpm).   Pattern: accelerations present.    Fetal State Assessment: Category I - tracings are normal.     Physical Exam  Constitutional: She is oriented to person, place, and time. She appears well-developed and well-nourished. No distress.  Ill appearing  HENT:  Head: Normocephalic.  Mouth/Throat: Mucous membranes are dry.  Neck: Normal range of motion. Neck supple.  Cardiovascular: Normal rate, regular rhythm and normal heart sounds.   Respiratory: Effort normal. No respiratory distress. She has rales.  GI: Soft. There is no tenderness.  Genitourinary: No bleeding around the vagina. No vaginal discharge (mucusy) found.  Neurological: She is alert and oriented to person, place, and time. She has normal reflexes.  Skin: Skin is warm and dry. She is not diaphoretic.    Prenatal labs: ABO, Rh: O/POS/-- (11/13 1147) Antibody: NEG (11/13 1147) Rubella: 2.27 (11/13 1147) RPR: NON REAC (12/11 1221)  HBsAg: NEGATIVE (11/13 1147)  HIV: NON REACTIVE (11/13 1147)  GBS: Negative (03/08 0000)   Results for orders placed during  the hospital encounter of 04/02/13 (from the past 24 hour(s))  CBC     Status: Abnormal   Collection Time    04/02/13  6:40 PM      Result Value Range   WBC 9.6  4.0 - 10.5 K/uL   RBC 3.61 (*) 3.87 - 5.11 MIL/uL   Hemoglobin 12.0  12.0 - 15.0 g/dL   HCT 72.5 (*) 36.6 -  46.0 %   MCV 94.7  78.0 - 100.0 fL   MCH 33.2  26.0 - 34.0 pg   MCHC 35.1  30.0 - 36.0 g/dL   RDW 44.0  34.7 - 42.5 %   Platelets 185  150 - 400 K/uL  BASIC METABOLIC PANEL     Status: Abnormal   Collection Time    04/02/13  6:40 PM      Result Value Range   Sodium 135  135 - 145 mEq/L   Potassium 3.0 (*) 3.5 - 5.1 mEq/L   Chloride 103  96 - 112 mEq/L   CO2 21  19 - 32 mEq/L   Glucose, Bld 89  70 - 99 mg/dL   BUN 4 (*) 6 - 23 mg/dL   Creatinine, Ser 9.56 (*) 0.50 - 1.10 mg/dL   Calcium 8.8  8.4 - 38.7 mg/dL   GFR calc non Af Amer >90  >90 mL/min   GFR calc Af Amer >90  >90 mL/min    Assessment/Plan: 27 yo [id number] at [redacted]w[redacted]d wks IUP Viral Illness, Influenza Suspected Hx of Stillbirth  Plan: Admit to antenatal Obtain urinalysis Influenza PCR pending Tamiflu 75 mg BID Reassess in AM    [facility] 04/02/2013, 11:30 PM

## 2013-04-02 NOTE — Progress Notes (Signed)
RROB spoke with Dr Debroah Loop to clarify what unit pt needs to be transferred/admitted to.  Pt to go to antenatal unit.  RROB told Kenney Houseman, house coverage at [facility] and [facility] for labor and delivery of pt to transfer to antenatal unit.  RROB called and gave report to [facility], pt to go to rm 157 on antenatal unit

## 2013-04-02 NOTE — ED Provider Notes (Signed)
CSN: [id number]     Arrival date & time 04/02/13  1805 History   First MD Initiated Contact with Patient 04/02/13 1816     Chief Complaint  Patient presents with  . multiple complaints    (Consider location/radiation/quality/duration/timing/severity/associated sxs/prior Treatment) Patient is a 27 y.o. female presenting with fever. The history is provided by the patient.  Fever Temp source:  Subjective Severity:  Moderate Onset quality:  Sudden Duration:  24 hours Timing:  Constant Progression:  Worsening Chronicity:  New Relieved by:  Nothing Worsened by:  Nothing tried Ineffective treatments:  Acetaminophen Associated symptoms: chills, cough (dry, nonproductive), diarrhea, nausea and vomiting   Associated symptoms: no chest pain, no dysuria, no ear pain, no rash and no rhinorrhea     Past Medical History  Diagnosis Date  . Hx of chlamydia infection   . Abnormal Pap smear    Past Surgical History  Procedure Laterality Date  . Colposcopy     Family History  Problem Relation Age of Onset  . Asthma Daughter   . Hypertension Mother   . Thyroid disease Mother   . Hypertension Maternal Aunt   . Hypertension Maternal Grandmother   . Thyroid disease Maternal Grandmother    History  Substance Use Topics  . Smoking status: Current Some Day Smoker -- 0.25 packs/day    Types: Cigarettes    Last Attempt to Quit: 02/26/2012  . Smokeless tobacco: Never Used  . Alcohol Use: No     Comment: occasionally   OB History   Grav Para Term Preterm Abortions TAB SAB Ect Mult Living   7 2 2  0 4 3 1  0 0 2     Review of Systems  Constitutional: Positive for fever and chills.  HENT: Negative for ear pain and rhinorrhea.   Respiratory: Positive for cough (dry, nonproductive).   Cardiovascular: Negative for chest pain and leg swelling.  Gastrointestinal: Positive for nausea, vomiting and diarrhea. Negative for abdominal pain.       She is having regular contractions  Genitourinary:  Negative for dysuria, vaginal bleeding, vaginal discharge and pelvic pain.  Skin: Negative for rash.  All other systems reviewed and are negative.    Allergies  Latex  Home Medications   Current Outpatient Rx  Name  Route  Sig  Dispense  Refill  . acetaminophen (TYLENOL) 325 MG tablet   Oral   Take 325-650 mg by mouth every 6 (six) hours as needed for mild pain, fever or headache.         . Prenatal Vit-Fe Fumarate-FA (PRENATAL MULTIVITAMIN) TABS   Oral   Take 1 tablet by mouth daily.          BP 113/54  Pulse 130  Temp(Src) 103.6 F (39.8 C)  Resp 34  Ht 5\' 3"  (1.6 m)  Wt 171 lb (77.565 kg)  BMI 30.30 kg/m2  SpO2 97% Physical Exam  Nursing note and vitals reviewed. Constitutional: She is oriented to person, place, and time. She appears well-developed and well-nourished. No distress.  HENT:  Head: Normocephalic and atraumatic.  Eyes: EOM are normal. Pupils are equal, round, and reactive to light.  Neck: Normal range of motion. Neck supple.  Cardiovascular: Normal rate and regular rhythm.  Exam reveals no friction rub.   No murmur heard. Pulmonary/Chest: Effort normal and breath sounds normal. No respiratory distress. She has no wheezes. She has no rales.  Abdominal: Soft. She exhibits no distension. There is no tenderness. There is no rebound.  Musculoskeletal:  Normal range of motion. She exhibits no edema.  Neurological: She is alert and oriented to person, place, and time.  Skin: She is not diaphoretic.    ED Course  Procedures (including critical care time) Labs Review Labs Reviewed  CBC  BASIC METABOLIC PANEL   Imaging Review No results found.  EKG Interpretation    Date/Time:    Ventricular Rate:    PR Interval:    QRS Duration:   QT Interval:    QTC Calculation:   R Axis:     Text Interpretation:              MDM   1. Tobacco use during pregnancy   2. Supervision of high-risk pregnancy, second trimester   3. Short interval  between pregnancies complicating pregnancy, antepartum, second trimester   4. History of stillbirth    52F is [redacted] weeks pregnant presents with fever, chills, nausea, vomiting, diarrhea, and abdominal contractions. She denies any gush of fluid and vaginal bleeding. This all began last night. It is not relieved with Tylenol. She's had 2 other pregnancies without complication. She's had no complications with this pregnancy so far. On exam, patient is febrile to 100.6, tachycardic. She is mildly tachypneic. Her blood pressure is stable. In her lungs she has mild diffuse wheezing. She is not in any respiratory distress. Her abdomen is gravid with uterus it is to be expected size. On the tocometry monitoring she is showing regular contractions every 2 minutes with fetal heart rate 160 to 170s. No decelerations. The patient's contractions or secondary to her symptoms which are likely due to the flu. We will aggressively hydrate her control her fever. OB rapid response nurse is here and at bedside for my initial evaluation I spoke with Dr. Debroah Loop of [facility] who will admit patient for further monitoring. Transferred to [facility].  Dagmar Hait, MD 04/02/13 [phone]

## 2013-04-02 NOTE — ED Notes (Signed)
[facility] Called for transport pt going to room 157

## 2013-04-03 LAB — URINALYSIS, ROUTINE W REFLEX MICROSCOPIC
Bilirubin Urine: NEGATIVE
Hgb urine dipstick: NEGATIVE
Ketones, ur: 15 mg/dL — AB
Leukocytes, UA: NEGATIVE
Nitrite: NEGATIVE
Urobilinogen, UA: 0.2 mg/dL (ref 0.0–1.0)

## 2013-04-03 LAB — INFLUENZA PANEL BY PCR (TYPE A & B)
H1N1 flu by pcr: NOT DETECTED
Influenza B By PCR: NEGATIVE

## 2013-04-03 MED ORDER — PRENATAL MULTIVITAMIN CH: 1.0000 | ORAL_TABLET | Freq: Every day | ORAL | Status: DC

## 2013-04-03 MED ORDER — OSELTAMIVIR PHOSPHATE 75 MG PO CAPS: 75.0000 mg | ORAL_CAPSULE | Freq: Two times a day (BID) | ORAL | Status: AC

## 2013-04-03 MED ORDER — ALBUTEROL SULFATE HFA 108 (90 BASE) MCG/ACT IN AERS
2.0000 | INHALATION_SPRAY | RESPIRATORY_TRACT | Status: DC | PRN
  Administered 2013-04-03: 2 via RESPIRATORY_TRACT
  Filled 2013-04-03: qty 6.7

## 2013-04-03 MED ORDER — NICOTINE 7 MG/24HR TD PT24
7.0000 mg | MEDICATED_PATCH | Freq: Every day | TRANSDERMAL | Status: DC
  Administered 2013-04-03: 7 mg via TRANSDERMAL
  Filled 2013-04-03 (×2): qty 1

## 2013-04-03 MED ORDER — ALBUTEROL SULFATE (5 MG/ML) 0.5% IN NEBU
2.5000 mg | INHALATION_SOLUTION | Freq: Once | RESPIRATORY_TRACT | Status: DC
  Filled 2013-04-03: qty 0.5

## 2013-04-03 MED ORDER — SODIUM CHLORIDE 0.9 % IJ SOLN: 3.0000 mL | Freq: Two times a day (BID) | INTRAMUSCULAR | Status: DC

## 2013-04-03 MED ORDER — POTASSIUM CHLORIDE CRYS ER 20 MEQ PO TBCR
40.0000 meq | EXTENDED_RELEASE_TABLET | Freq: Every day | ORAL | Status: DC
  Administered 2013-04-03: 40 meq via ORAL
  Filled 2013-04-03 (×2): qty 2

## 2013-04-03 NOTE — Discharge Summary (Signed)
Physician Discharge Summary  Patient ID: Jordan Hall MRN: [id number] DOB/AGE: 19-Jun-1985 27 y.o.  Admit date: 04/02/2013 Discharge date: 04/03/2013  Admission Diagnoses: Influenza, IUP, second trimester  Discharge Diagnoses:  Active Problems:   Influenza-like illness   Viral illness   Discharged Condition: good  Hospital Course: Pt is 27 yo [id number] at [redacted]w[redacted]d who was admitted on 12/21 for signs and symptoms of influenza with inability to tolerate PO. She was given IV rehydration and symptomatic therapy while awaiting results of influenza testing. On 12/22 she was started on tamiflu and symptoms improved greatly. At time of discharge she reports cough with some mucus production but overall feeling much better. She reports tolerating PO all day, no shortness of breath, ambulating and urinating well. She is afebrile and vital signs are within normal limits. She will be discharged with prescription to continue tamiflu until 04/07/13. She denies contractions, vaginal bleeding, loss of fluid and reports good fetal movement throughout hospital stay and at discharge.  Consults: None  Significant Diagnostic Studies: labs:  Results for orders placed during the hospital encounter of 04/02/13 (from the past 48 hour(s))  CBC     Status: Abnormal   Collection Time    04/02/13  6:40 PM      Result Value Range   WBC 9.6  4.0 - 10.5 K/uL   RBC 3.61 (*) 3.87 - 5.11 MIL/uL   Hemoglobin 12.0  12.0 - 15.0 g/dL   HCT 84.6 (*) 96.2 - 95.2 %   MCV 94.7  78.0 - 100.0 fL   MCH 33.2  26.0 - 34.0 pg   MCHC 35.1  30.0 - 36.0 g/dL   RDW 84.1  32.4 - 40.1 %   Platelets 185  150 - 400 K/uL  BASIC METABOLIC PANEL     Status: Abnormal   Collection Time    04/02/13  6:40 PM      Result Value Range   Sodium 135  135 - 145 mEq/L   Potassium 3.0 (*) 3.5 - 5.1 mEq/L   Chloride 103  96 - 112 mEq/L   CO2 21  19 - 32 mEq/L   Glucose, Bld 89  70 - 99 mg/dL   BUN 4 (*) 6 - 23 mg/dL   Creatinine, Ser 0.27 (*) 0.50  - 1.10 mg/dL   Calcium 8.8  8.4 - 25.3 mg/dL   GFR calc non Af Amer >90  >90 mL/min   GFR calc Af Amer >90  >90 mL/min   Comment: (NOTE)     The eGFR has been calculated using the CKD EPI equation.     This calculation has not been validated in all clinical situations.     eGFR's persistently <90 mL/min signify possible Chronic Kidney     Disease.  INFLUENZA PANEL BY PCR     Status: Abnormal   Collection Time    04/02/13  8:55 PM      Result Value Range   Influenza A By PCR POSITIVE (*) NEGATIVE   Influenza B By PCR NEGATIVE  NEGATIVE   H1N1 flu by pcr NOT DETECTED  NOT DETECTED   Comment:            The Xpert Flu assay (FDA approved for     nasal aspirates or washes and     nasopharyngeal swab specimens), is     intended as an aid in the diagnosis of     influenza and should not be used as     a sole  basis for treatment.  URINALYSIS, ROUTINE W REFLEX MICROSCOPIC     Status: Abnormal   Collection Time    04/03/13 12:00 AM      Result Value Range   Color, Urine YELLOW  YELLOW   APPearance CLEAR  CLEAR   Specific Gravity, Urine 1.010  1.005 - 1.030   pH 5.5  5.0 - 8.0   Glucose, UA 100 (*) NEGATIVE mg/dL   Hgb urine dipstick NEGATIVE  NEGATIVE   Bilirubin Urine NEGATIVE  NEGATIVE   Ketones, ur 15 (*) NEGATIVE mg/dL   Protein, ur NEGATIVE  NEGATIVE mg/dL   Urobilinogen, UA 0.2  0.0 - 1.0 mg/dL   Nitrite NEGATIVE  NEGATIVE   Leukocytes, UA NEGATIVE  NEGATIVE   Comment: MICROSCOPIC NOT DONE ON URINES WITH NEGATIVE PROTEIN, BLOOD, LEUKOCYTES, NITRITE, OR GLUCOSE <1000 mg/dL.     Treatments: IV hydration and tamiflu and symptomatic treatment  Discharge Exam: Blood pressure 126/62, pulse 108, temperature 99.1 F (37.3 C), temperature source Oral, resp. rate 20, height 5\' 3"  (1.6 m), weight 77.565 kg (171 lb), SpO2 95.00%. General appearance: alert, cooperative and no distress Head: Normocephalic, without obvious abnormality, atraumatic Resp: clear to auscultation bilaterally  and normal percussion bilaterally Cardio: regular rate and rhythm, S1, S2 normal, no murmur, click, rub or gallop GI: soft, non-tender; bowel sounds normal; no masses,  no organomegaly Pulses: 2+ and symmetric Skin: Skin color, texture, turgor normal. No rashes or lesions  Disposition: 01-Home or Self Care   Future Appointments Provider Department Dept Phone   04/20/2013 9:15 AM Catalina Antigua, MD [facility] [phone]       Medication List    ASK your doctor about these medications       acetaminophen 325 MG tablet  Commonly known as:  TYLENOL  Take 325-650 mg by mouth every 6 (six) hours as needed for mild pain, fever or headache.     prenatal multivitamin Tabs tablet  Take 1 tablet by mouth daily.         Signed: Tommi Rumps 04/03/2013, 9:10 PM

## 2013-04-03 NOTE — Progress Notes (Signed)
UR chart review completed.  

## 2013-04-03 NOTE — H&P (Signed)
I accepted transfer and I agree with the admission and plan. Adam Phenix, MD 04/03/2013

## 2013-04-03 NOTE — Progress Notes (Signed)
Patient ID: Jordan Hall, female   DOB: 07-08-1985, 27 y.o.   MRN: [id number] FACULTY PRACTICE ANTEPARTUM(COMPREHENSIVE) NOTE  Jordan Hall is a 27 y.o. [id number] at [redacted]w[redacted]d by midtrimester ultrasound who is admitted for viral respiratory infection.   Fetal presentation is unsure. Length of Stay:  1  Days  Subjective: Fells better but still weak, cough with some mucus Patient reports the fetal movement as active. Patient reports uterine contraction  activity as none. Patient reports  vaginal bleeding as none. Patient describes fluid per vagina as None.  Vitals:  Blood pressure 118/62, pulse 99, temperature 98.7 F (37.1 C), temperature source Axillary, resp. rate 18, height 5\' 3"  (1.6 m), weight 171 lb (77.565 kg), SpO2 94.00%. Physical Examination:  General appearance - ill-appearing and mild cough Heart - normal rate and regular rhythm Abdomen - soft, nontender, gravid Fundal Height:  size equals dates Cervical Exam: Not evaluated. Extremities: extremities normal, atraumatic, no cyanosis or edema and Homans sign is negative, no sign of DVT Membranes:intact  Fetal Monitoring:  Baseline: 150 bpm, Variability: Fair (1-6 bpm), Accelerations: Non-reactive but appropriate for gestational age and Decelerations: Absent  Labs:  Results for orders placed during the hospital encounter of 04/02/13 (from the past 24 hour(s))  CBC   Collection Time    04/02/13  6:40 PM      Result Value Range   WBC 9.6  4.0 - 10.5 K/uL   RBC 3.61 (*) 3.87 - 5.11 MIL/uL   Hemoglobin 12.0  12.0 - 15.0 g/dL   HCT 14.7 (*) 82.9 - 56.2 %   MCV 94.7  78.0 - 100.0 fL   MCH 33.2  26.0 - 34.0 pg   MCHC 35.1  30.0 - 36.0 g/dL   RDW 13.0  86.5 - 78.4 %   Platelets 185  150 - 400 K/uL  BASIC METABOLIC PANEL   Collection Time    04/02/13  6:40 PM      Result Value Range   Sodium 135  135 - 145 mEq/L   Potassium 3.0 (*) 3.5 - 5.1 mEq/L   Chloride 103  96 - 112 mEq/L   CO2 21  19 - 32 mEq/L   Glucose, Bld 89  70 -  99 mg/dL   BUN 4 (*) 6 - 23 mg/dL   Creatinine, Ser 6.96 (*) 0.50 - 1.10 mg/dL   Calcium 8.8  8.4 - 29.5 mg/dL   GFR calc non Af Amer >90  >90 mL/min   GFR calc Af Amer >90  >90 mL/min  URINALYSIS, ROUTINE W REFLEX MICROSCOPIC   Collection Time    04/03/13 12:00 AM      Result Value Range   Color, Urine YELLOW  YELLOW   APPearance CLEAR  CLEAR   Specific Gravity, Urine 1.010  1.005 - 1.030   pH 5.5  5.0 - 8.0   Glucose, UA 100 (*) NEGATIVE mg/dL   Hgb urine dipstick NEGATIVE  NEGATIVE   Bilirubin Urine NEGATIVE  NEGATIVE   Ketones, ur 15 (*) NEGATIVE mg/dL   Protein, ur NEGATIVE  NEGATIVE mg/dL   Urobilinogen, UA 0.2  0.0 - 1.0 mg/dL   Nitrite NEGATIVE  NEGATIVE   Leukocytes, UA NEGATIVE  NEGATIVE    Imaging Studies:      Currently EPIC will not allow sonographic studies to automatically populate into notes.  In the meantime, copy and paste results into note or free text.  Medications:  Scheduled . docusate sodium  100 mg Oral Daily  . oseltamivir  75 mg Oral BID  . prenatal multivitamin  1 tablet Oral Q1200   I have reviewed the patient's current medications.  ASSESSMENT: Patient Active Problem List   Diagnosis Date Noted  . Influenza-like illness 04/02/2013  . Viral illness 04/02/2013  . LSIL (low grade squamous intraepithelial lesion) on Pap smear 03/28/2013  . Tobacco use during pregnancy 03/23/2013  . Supervision of high-risk pregnancy 02/23/2013  . Placenta previa antepartum 02/23/2013  . Short interval between pregnancies complicating pregnancy, antepartum 02/23/2013  . History of stillbirth 02/23/2013  . H/O macrosomia in infant in prior pregnancy, currently pregnant 06/14/2012  . Smoker 06/14/2012  . 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

## 2013-04-13 NOTE — L&D Delivery Note (Signed)
Attestation of Attending Supervision of Advanced Practitioner (PA/CNM/NP): Evaluation and management procedures were performed by the Advanced Practitioner under my supervision and collaboration.  I have reviewed the Advanced Practitioner's note and chart, and I agree with the management and plan.  Thorne Wirz, MD, FACOG Attending Obstetrician & Gynecologist Faculty Practice, [facility]  

## 2013-04-13 NOTE — L&D Delivery Note (Signed)
Delivery Note  A few minutes after arrival to room 172, pt had an urge to push.  AROM with meconium stained fluid.  At 2051 a non-viable female was delivered via  (Presentation:LOA ).  APGAR0/0:  ; weight pending.  There was no bleeding and no nuchal cord noted.  10 U pitocin was then given IM.  The placenta separated spontaneously and delivered via CCT and maternal pushing effort. No clots/bleeding. It was inspected and appears to be intact with a 3 VC.   Anesthesia: none  Episiotomy: none Lacerations: none Suture Repair: n/a Est. Blood Loss (mL): 200  Mom to postpartum.  Baby to mother's arms.  Appropriate grieving.

## 2013-04-20 ENCOUNTER — Encounter: Payer: Medicaid Other | Admitting: Obstetrics and Gynecology

## 2013-05-04 ENCOUNTER — Encounter: Payer: Self-pay | Admitting: Family

## 2013-05-04 ENCOUNTER — Ambulatory Visit (HOSPITAL_COMMUNITY)
Admission: RE | Admit: 2013-05-04 | Discharge: 2013-05-04 | Disposition: A | Discharge status: Home / Self Care | Payer: Medicaid Other | Source: Ambulatory Visit | Attending: Family | Admitting: Family

## 2013-05-04 ENCOUNTER — Ambulatory Visit (INDEPENDENT_AMBULATORY_CARE_PROVIDER_SITE_OTHER): Payer: Medicaid Other | Admitting: Family

## 2013-05-04 VITALS — BP 123/75 | Temp 97.7°F | Wt 178.8 lb

## 2013-05-04 DIAGNOSIS — O289 Unspecified abnormal findings on antenatal screening of mother: Secondary | ICD-10-CM | POA: Insufficient documentation

## 2013-05-04 DIAGNOSIS — O288 Other abnormal findings on antenatal screening of mother: Secondary | ICD-10-CM

## 2013-05-04 DIAGNOSIS — Z8759 Personal history of other complications of pregnancy, childbirth and the puerperium: Secondary | ICD-10-CM

## 2013-05-04 DIAGNOSIS — O09299 Supervision of pregnancy with other poor reproductive or obstetric history, unspecified trimester: Secondary | ICD-10-CM

## 2013-05-04 DIAGNOSIS — O09899 Supervision of other high risk pregnancies, unspecified trimester: Secondary | ICD-10-CM

## 2013-05-04 DIAGNOSIS — O0990 Supervision of high risk pregnancy, unspecified, unspecified trimester: Secondary | ICD-10-CM

## 2013-05-04 LAB — FETAL FIBRONECTIN: Fetal Fibronectin: NEGATIVE

## 2013-05-04 NOTE — Progress Notes (Signed)
Reports feeling pelvic pressure x one week.  No vaginal bleeding or leaking of fluid.  No UTI symptoms.  Cervix 1/thick/long.  Missed colposcopy appt for LSIL > obtain postpartum.  Begin fetal testing.  Pt discussed not desiring to have induction of labor.  NST with 10x10 accels, consulted with Dr. Macon LargeAnyanwu > send for BPP and AFI.

## 2013-05-04 NOTE — Progress Notes (Signed)
Bilateral Tubal Ligation Papers signed/ witnessed.

## 2013-05-04 NOTE — Progress Notes (Signed)
P=88,   C/ o increasing pelvic pressure,

## 2013-05-08 ENCOUNTER — Other Ambulatory Visit: Payer: Medicaid Other

## 2013-05-11 ENCOUNTER — Other Ambulatory Visit: Payer: Medicaid Other

## 2013-06-01 ENCOUNTER — Inpatient Hospital Stay (HOSPITAL_COMMUNITY)
Admission: AD | Admit: 2013-06-01 | Discharge: 2013-06-02 | DRG: 775 | Disposition: A | Discharge status: Home / Self Care | Payer: Medicaid Other | Source: Ambulatory Visit | Attending: Obstetrics & Gynecology | Admitting: Obstetrics & Gynecology

## 2013-06-01 ENCOUNTER — Encounter (HOSPITAL_COMMUNITY): Payer: Self-pay

## 2013-06-01 ENCOUNTER — Inpatient Hospital Stay (HOSPITAL_COMMUNITY): Payer: Medicaid Other

## 2013-06-01 DIAGNOSIS — O99334 Smoking (tobacco) complicating childbirth: Secondary | ICD-10-CM

## 2013-06-01 DIAGNOSIS — O364XX0 Maternal care for intrauterine death, not applicable or unspecified: Secondary | ICD-10-CM

## 2013-06-01 DIAGNOSIS — O09899 Supervision of other high risk pregnancies, unspecified trimester: Secondary | ICD-10-CM

## 2013-06-01 DIAGNOSIS — O4400 Placenta previa specified as without hemorrhage, unspecified trimester: Secondary | ICD-10-CM

## 2013-06-01 DIAGNOSIS — O99330 Smoking (tobacco) complicating pregnancy, unspecified trimester: Secondary | ICD-10-CM

## 2013-06-01 DIAGNOSIS — O0990 Supervision of high risk pregnancy, unspecified, unspecified trimester: Secondary | ICD-10-CM

## 2013-06-01 DIAGNOSIS — Z8759 Personal history of other complications of pregnancy, childbirth and the puerperium: Secondary | ICD-10-CM

## 2013-06-01 LAB — DIC (DISSEMINATED INTRAVASCULAR COAGULATION) PANEL
APTT: 27 s (ref 24–37)
D DIMER QUANT: 0.72 ug{FEU}/mL — AB (ref 0.00–0.48)
INR: 1 (ref 0.00–1.49)
PLATELETS: 338 10*3/uL (ref 150–400)
SMEAR REVIEW: NONE SEEN

## 2013-06-01 LAB — CBC
HEMATOCRIT: 36.4 % (ref 36.0–46.0)
HEMOGLOBIN: 13.3 g/dL (ref 12.0–15.0)
MCH: 33.7 pg (ref 26.0–34.0)
MCHC: 36.5 g/dL — ABNORMAL HIGH (ref 30.0–36.0)
MCV: 92.2 fL (ref 78.0–100.0)
Platelets: 338 10*3/uL (ref 150–400)
RBC: 3.95 MIL/uL (ref 3.87–5.11)
RDW: 13.7 % (ref 11.5–15.5)
WBC: 14.3 10*3/uL — ABNORMAL HIGH (ref 4.0–10.5)

## 2013-06-01 LAB — ANTITHROMBIN III: AntiThromb III Func: 122 % — ABNORMAL HIGH (ref 75–120)

## 2013-06-01 LAB — DIC (DISSEMINATED INTRAVASCULAR COAGULATION)PANEL
Fibrinogen: 376 mg/dL (ref 204–475)
Prothrombin Time: 13 seconds (ref 11.6–15.2)

## 2013-06-01 MED ORDER — OXYTOCIN 10 UNIT/ML IJ SOLN: 10.0000 [IU] | Freq: Once | INTRAMUSCULAR | Status: DC

## 2013-06-01 MED ORDER — OXYTOCIN BOLUS FROM INFUSION: 500.0000 mL | INTRAVENOUS | Status: DC

## 2013-06-01 MED ORDER — CITRIC ACID-SODIUM CITRATE 334-500 MG/5ML PO SOLN: 30.0000 mL | ORAL | Status: DC | PRN

## 2013-06-01 MED ORDER — IBUPROFEN 600 MG PO TABS: 600.0000 mg | ORAL_TABLET | Freq: Four times a day (QID) | ORAL | Status: DC | PRN

## 2013-06-01 MED ORDER — OXYCODONE-ACETAMINOPHEN 5-325 MG PO TABS
1.0000 | ORAL_TABLET | ORAL | Status: DC | PRN
  Administered 2013-06-01: 2 via ORAL
  Filled 2013-06-01: qty 2

## 2013-06-01 MED ORDER — IBUPROFEN 600 MG PO TABS
600.0000 mg | ORAL_TABLET | Freq: Four times a day (QID) | ORAL | Status: DC | PRN
  Administered 2013-06-01: 600 mg via ORAL
  Filled 2013-06-01: qty 1

## 2013-06-01 MED ORDER — LACTATED RINGERS IV SOLN: INTRAVENOUS | Status: DC

## 2013-06-01 MED ORDER — LACTATED RINGERS IV SOLN: 500.0000 mL | INTRAVENOUS | Status: DC | PRN

## 2013-06-01 MED ORDER — ACETAMINOPHEN 325 MG PO TABS: 650.0000 mg | ORAL_TABLET | ORAL | Status: DC | PRN

## 2013-06-01 MED ORDER — ONDANSETRON HCL 4 MG/2ML IJ SOLN
4.0000 mg | Freq: Four times a day (QID) | INTRAMUSCULAR | Status: DC | PRN
Start: 2013-06-01 — End: 2013-06-02

## 2013-06-01 MED ORDER — OXYCODONE-ACETAMINOPHEN 5-325 MG PO TABS: 1.0000 | ORAL_TABLET | Freq: Four times a day (QID) | ORAL | Status: DC | PRN

## 2013-06-01 MED ORDER — DOCUSATE SODIUM 100 MG PO CAPS: 100.0000 mg | ORAL_CAPSULE | Freq: Two times a day (BID) | ORAL | Status: DC | PRN

## 2013-06-01 MED ORDER — OXYTOCIN 10 UNIT/ML IJ SOLN
INTRAMUSCULAR | Status: AC
  Administered 2013-06-01: 20 [IU]
  Filled 2013-06-01: qty 2

## 2013-06-01 MED ORDER — LIDOCAINE HCL (PF) 1 % IJ SOLN: 30.0000 mL | INTRAMUSCULAR | Status: DC | PRN

## 2013-06-01 MED ORDER — OXYTOCIN 40 UNITS IN LACTATED RINGERS INFUSION - SIMPLE MED: 62.5000 mL/h | INTRAVENOUS | Status: DC

## 2013-06-01 NOTE — Discharge Summary (Signed)
Obstetric Discharge Summary Reason for Admission: Active labor; intrauterine fetal demise (IUFD) diagnosed at 3837 weeks GA; history of previous term stillbirth in 2009 Prenatal Procedures: ultrasound Intrapartum Procedures: spontaneous vaginal delivery Postpartum Procedures: none Complications-Operative and Postpartum: none Hemoglobin  Date Value Ref Range Status  06/01/2013 13.3  12.0 - 15.0 g/dL Final     HCT  Date Value Ref Range Status  06/01/2013 36.4  36.0 - 46.0 % Final   Brief Hospital Course:  Patient is a 28 y.o. [id number] at 5773w6d who presented to MAU for labor check today. Unfortunately, FHR could not be obtained with doppler and ultrasound confirmed IUFD.  She was noted to progress rapidly from a cervical dilation of 6 cm to 9 cm and was urgently sent to L&D where she had a SVD, see note below.  Delivery Note A few minutes after arrival to room 172, pt had an urge to push.  AROM with meconium stained fluid. At 2051, a non-viable female was delivered via  (Presentation:LOA ).  APGAR0/0; weight 2835 g.  There was no bleeding and no nuchal cord noted.  10 U pitocin was then given IM.  The placenta separated spontaneously and delivered via CCT and maternal pushing effort. No clots/bleeding. It was inspected and appears to be intact with a 3 VC.  Anesthesia: none  Episiotomy: none Lacerations: none Suture Repair: n/a Est. Blood Loss (mL): 200 Mom to postpartum.  Baby to mother's arms.  Appropriately grieving.  After delivery, [facility] and appropriate support was given to her and her FOB.  She inquired about getting a bilateral tubal sterilization; she was told that this is not recommended now given these circumstances.  IUD or other form of long acting reversible contraception recommended.  She was told she can get a tubal ligation later if she still desired this modality.  Patient said she wanted to go home today. She was observed for about 3 hours after birth  and was noted to have minimal bleeding. She had appropriate amount of discomfort, and was stable. She was discharged to home with her FOB.  Of note, patient had many lab studies pending at time of discharge, will follow up results and manage accordingly.   Physical Exam:  BP 118/69  Pulse 102  Temp(Src) 98.1 F (36.7 C) (Oral)  Resp 20  Ht 5\' 3"  (1.6 m)  Wt 182 lb (82.555 kg)  BMI 32.25 kg/m2  SpO2 97% General: alert and no distress Lochia: appropriate Uterine Fundus: firm and below umbilicus DVT Evaluation: No evidence of DVT seen on physical exam. Negative Homan's sign.  Discharge Diagnoses: Term Intrauterine Fetal Death - delivered  Discharge Information: Date: 06/01/2013 Activity: pelvic rest for 6 weeks Diet: routine Medications: Ibuprofen, Colace and Percocet Condition: stable Instructions: Refer to AVS instructions Discharge to: home Follow-up Information   [facility] (You will be called with an appointment.  Call clinic/come to MAU for any concerning issues)    Contact information:   [address] [place] [id number] [phone]      Tereso NewcomerUgonna A Nehemiah Mcfarren, MD 06/01/2013, 11:46 PM

## 2013-06-01 NOTE — H&P (Signed)
Attestation of Attending Supervision of Advanced Practitioner (PA/CNM/NP): Evaluation and management procedures were performed by the Advanced Practitioner under my supervision and collaboration.  I have reviewed the Advanced Practitioner's note and chart, and I agree with the management and plan.  Dashea Mcmullan, MD, FACOG Attending Obstetrician & Gynecologist Faculty Practice, [facility]  

## 2013-06-01 NOTE — MAU Note (Signed)
Contractions  

## 2013-06-01 NOTE — MAU Note (Addendum)
Attempts made to place pt of monitor and find fetal heart tone. Pt appears very uncomfortable, contracting q1-2 mins, Pt asked to turn to left side and right side without heart tones.  Pt states she has been having contractions all day. Blanche EastJ. Rasch NP called into room for assistance. Unable to find heart VE done  Pt 6cm dilated. Drenda FreezeFran Cres-Dishmon called to bedside, bedside ultrasound ordered, Pt also dilated to  9cm. Pt informed that no heart tones noted.

## 2013-06-01 NOTE — MAU Note (Signed)
Pt to 172 per bed with Mayford KnifeWilliams, CNM and RN.

## 2013-06-01 NOTE — H&P (Signed)
Jordan Hall is a 28 y.o. female [phone] with IUP at [redacted]w[redacted]d presenting for contractions since this afternoo. Pt states she has been having regular, every 3 minutes contractions, associated with none vaginal bleeding.  Membranes are intact.  Last fetal movement perceived this afternoon. [facility]  Last prenatal visit was a month ago,  Has not showed up for ordered twice weekly testing.  Prenatal History/Complications: Hx term stillbirth at 73 weeks Past Medical History: Past Medical History  Diagnosis Date  . Hx of chlamydia infection   . Abnormal Pap smear     Past Surgical History: Past Surgical History  Procedure Laterality Date  . Colposcopy      Obstetrical History: OB History   Grav Para Term Preterm Abortions TAB SAB Ect Mult Living   8 3 3  0 4 3 1  0 0 2      Gynecological History:  Pertinent Gynecological History: LSIL 1/15:  Missed colpo Social History: History   Social History  . Marital Status: Single    Spouse Name: N/A    Number of Children: N/A  . Years of Education: N/A   Social History Main Topics  . Smoking status: Current Some Day Smoker -- 0.25 packs/day    Types: Cigarettes  . Smokeless tobacco: Never Used  . Alcohol Use: No     Comment: occasionally  . Drug Use: No  . Sexual Activity: Yes   Other Topics Concern  . Not on file   Social History Narrative  . No narrative on file    Family History: Family History  Problem Relation Age of Onset  . Asthma Daughter   . Hypertension Mother   . Thyroid disease Mother   . Hypertension Maternal Aunt   . Hypertension Maternal Grandmother   . Thyroid disease Maternal Grandmother     Allergies: Allergies  Allergen Reactions  . Latex Rash    Prescriptions prior to admission  Medication Sig Dispense Refill  . acetaminophen (TYLENOL) 325 MG tablet Take 325-650 mg by mouth every 6 (six) hours as needed for mild pain, fever or headache.      . Prenatal Vit-Fe Fumarate-FA  (PRENATAL MULTIVITAMIN) TABS tablet Take 1 tablet by mouth daily.  30 tablet  3      Review of Systems   Constitutional: Negative for fever and chills Eyes: Negative for visual disturbances Respiratory: Negative for shortness of breath, dyspnea Cardiovascular: Negative for chest pain or palpitations  Gastrointestinal: Negative for vomiting, diarrhea and constipation.  POSITIVE for contractions Genitourinary: Negative for dysuria and urgency Musculoskeletal: Negative for back pain, joint pain, myalgias  Neurological: Negative for dizziness and headaches   Blood pressure 140/74, pulse 83, temperature 98.1 F (36.7 C), temperature source Oral, resp. rate 20, height 5\' 3"  (1.6 m), weight 82.555 kg (182 lb), SpO2 97.00%. General appearance: alert, cooperative and moderate distress Lungs: clear to auscultation bilaterally Heart: regular rate and rhythm Abdomen: soft, non-tender; bowel sounds normal Extremities: Homans sign is negative, no sign of DVT DTR's 2+ Presentation: cephalic Fetal monitoring no FHR, confirmed with bedside u/s Uterine activity  q 2-3 minutes  CX 9/100/+1     Prenatal labs: ABO, Rh: O/POS/-- (11/13 1147) Antibody: NEG (11/13 1147) Rubella:  immune RPR: NON REAC (12/11 1221)  HBsAg: NEGATIVE (11/13 1147)  HIV: NON REACTIVE (11/13 1147)  GBS: Negative (03/08 0000)  1 hr Glucola not done Genetic screening  Not done Anatomy US normal   No results found for this or any  previous visit (from the past 24 hour(s)).  Assessment: Jordan Hall is a 28 y.o. [phone] with an IUP at 6568w6d presenting for labor with a term IUFD  Plan: To l&d; delivery imminent   CRESENZO-DISHMAN,Irving Lubbers 06/01/2013, 9:18 PM

## 2013-06-01 NOTE — Progress Notes (Signed)
Chaplain was in the hospital when he was alerted of the pending birth of Jordan Hall, the daughter of Jordan Hall. [name]'s death was very traumatic for both parents. At present, shortly after delivery, both parents are in shock.  Jordan Hall' family [place] in [place]. The father's family [place] in [place] and [place] give comfort and support. A complete spiritual assessment was not made because Jordan Hall will be going to room 309 shortly after our visit.  Jordan Hall was present in the room when the chaplain visited.  Grief care and prayer given.  REQUEST: Spiritual Care follow up by Daytime chaplains after shift change in the morning.   Jordan Hall, DMIn, APC Chaplain

## 2013-06-01 NOTE — Discharge Instructions (Signed)
°  Refer to this sheet in the next few weeks. These discharge instructions provide you with information on caring for yourself after delivery. Your caregiver may also give you specific instructions. Your treatment has been planned according to the most current medical practices available, but problems sometimes occur. Call your caregiver if you have any problems or questions after you go home. °HOME CARE INSTRUCTIONS  °· Resume activity as directed by your caregiver. °· Have someone help with home and family responsibilities during this time.   °· Keep track of the number of sanitary pads you use each day and how soaked (saturated) they are. Write down this information.   °· Do not use tampons. Do not douche or have sexual intercourse until approved by your caregiver.   °· Only take over-the-counter or prescription medicines for pain or discomfort as directed by your caregiver.   °· Do not take aspirin. Aspirin can cause bleeding.   °· Keep all follow-up appointments with your caregiver.   °· If you or your partner have problems with grieving, talk to your caregiver or seek counseling to help cope with your loss. Allow enough time to grieve before trying to get pregnant again.   °SEEK IMMEDIATE MEDICAL CARE IF:  °· You have severe cramps or pain in your back or abdomen. °· You have a fever. °· You pass large blood clots (walnut-sized or larger) or tissue from your vagina. Save any tissue for your caregiver to inspect.   °· Your bleeding increases.   °· You have a thick, bad-smelling vaginal discharge. °· You become lightheaded, weak, or you faint.   °· You have chills.   °MAKE SURE YOU: °· Understand these instructions. °· Will watch your condition. °· Will get help right away if you are not doing well or get worse. °Document Released: 09/23/2000 Document Revised: 07/25/2012 Document Reviewed: 05/19/2011 °Exit[employer]® Patient Information ©2014 [employer]. ° ° °

## 2013-06-02 ENCOUNTER — Other Ambulatory Visit: Payer: Medicaid Other

## 2013-06-02 LAB — RAPID URINE DRUG SCREEN, HOSP PERFORMED
Amphetamines: NOT DETECTED
BARBITURATES: NOT DETECTED
Benzodiazepines: NOT DETECTED
Cocaine: NOT DETECTED
Opiates: NOT DETECTED
Tetrahydrocannabinol: NOT DETECTED

## 2013-06-02 LAB — KLEIHAUER-BETKE STAIN
Fetal Cells %: 0 %
QUANTITATION FETAL HEMOGLOBIN: 0 mL

## 2013-06-04 ENCOUNTER — Encounter (HOSPITAL_COMMUNITY): Payer: Self-pay | Admitting: Advanced Practice Midwife

## 2013-06-05 LAB — TISSUE CULTURE
CULTURE: NO GROWTH
SPECIAL REQUESTS: NORMAL

## 2013-06-10 ENCOUNTER — Emergency Department (HOSPITAL_COMMUNITY)
Admission: EM | Admit: 2013-06-10 | Discharge: 2013-06-10 | Disposition: A | Discharge status: Home / Self Care | Payer: Medicaid Other | Attending: Emergency Medicine | Admitting: Emergency Medicine

## 2013-06-10 ENCOUNTER — Encounter (HOSPITAL_COMMUNITY): Payer: Self-pay | Admitting: Emergency Medicine

## 2013-06-10 DIAGNOSIS — Z79899 Other long term (current) drug therapy: Secondary | ICD-10-CM | POA: Insufficient documentation

## 2013-06-10 DIAGNOSIS — Z8619 Personal history of other infectious and parasitic diseases: Secondary | ICD-10-CM | POA: Insufficient documentation

## 2013-06-10 DIAGNOSIS — F17200 Nicotine dependence, unspecified, uncomplicated: Secondary | ICD-10-CM | POA: Insufficient documentation

## 2013-06-10 DIAGNOSIS — H109 Unspecified conjunctivitis: Secondary | ICD-10-CM | POA: Insufficient documentation

## 2013-06-10 DIAGNOSIS — Z91040 Latex allergy status: Secondary | ICD-10-CM | POA: Insufficient documentation

## 2013-06-10 MED ORDER — TETRACAINE HCL 0.5 % OP SOLN
1.0000 [drp] | Freq: Once | OPHTHALMIC | Status: AC
  Administered 2013-06-10: 1 [drp] via OPHTHALMIC
  Filled 2013-06-10: qty 2

## 2013-06-10 MED ORDER — FLUORESCEIN SODIUM 1 MG OP STRP
1.0000 | ORAL_STRIP | Freq: Once | OPHTHALMIC | Status: AC
  Administered 2013-06-10: 1 via OPHTHALMIC
  Filled 2013-06-10: qty 1

## 2013-06-10 MED ORDER — TOBRAMYCIN 0.3 % OP SOLN
1.0000 [drp] | OPHTHALMIC | Status: DC
  Administered 2013-06-10: 1 [drp] via OPHTHALMIC
  Filled 2013-06-10: qty 5

## 2013-06-10 NOTE — ED Provider Notes (Signed)
CSN: [id number]     Arrival date & time 06/10/13  1638 History  This chart was scribed for non-physician practitioner Cherrie Distance working with Richardean Canal, MD by Elveria Rising, ED Scribe. This patient was seen in room TR04C/TR04C and the patient's care was started at 5:17 PM.   Chief Complaint  Patient presents with  . Eye Pain      The history is provided by the patient. No language interpreter was used.   HPI Comments: Jordan Hall is a 28 y.o. female who presents to the Emergency Department complaining of worsening left eye pain and swelling onset last night. Patient reports that this morning her left eye began burning and excessively tearing. Patient denies injury to left eye but suspects she may have scratched it removing "sleep" from her eye. Patient denies associated visual disturbance. Patient does not wear contact lens.   Past Medical History  Diagnosis Date  . Hx of chlamydia infection   . Abnormal Pap smear    Past Surgical History  Procedure Laterality Date  . Colposcopy     Family History  Problem Relation Age of Onset  . Asthma Daughter   . Hypertension Mother   . Thyroid disease Mother   . Hypertension Maternal Aunt   . Hypertension Maternal Grandmother   . Thyroid disease Maternal Grandmother    History  Substance Use Topics  . Smoking status: Current Some Day Smoker -- 0.25 packs/day    Types: Cigarettes  . Smokeless tobacco: Never Used  . Alcohol Use: Yes     Comment: occasionally   OB History   Grav Para Term Preterm Abortions TAB SAB Ect Mult Living   8 4 3 1 4 3 1  0 0 2     Review of Systems  Eyes: Positive for pain and redness. Negative for itching and visual disturbance.  All other systems reviewed and are negative.      Allergies  Latex  Home Medications   Current Outpatient Rx  Name  Route  Sig  Dispense  Refill  . acetaminophen (TYLENOL) 325 MG tablet   Oral   Take 325-650 mg by mouth every 6 (six) hours as needed for mild  pain, fever or headache.         . docusate sodium (COLACE) 100 MG capsule   Oral   Take 1 capsule (100 mg total) by mouth 2 (two) times daily as needed.   30 capsule   2   . ibuprofen (ADVIL,MOTRIN) 600 MG tablet   Oral   Take 1 tablet (600 mg total) by mouth every 6 (six) hours as needed for mild pain or moderate pain (pain scale < 4).   60 tablet   2   . oxyCODONE-acetaminophen (PERCOCET/ROXICET) 5-325 MG per tablet   Oral   Take 1-2 tablets by mouth every 6 (six) hours as needed for severe pain (for pain scale > 4).   15 tablet   0    BP 139/92  Pulse 71  Temp(Src) 98.7 F (37.1 C) (Oral)  Resp 18  SpO2 100% Physical Exam  Nursing note and vitals reviewed. Constitutional: She is oriented to person, place, and time. She appears well-developed and well-nourished. No distress.  HENT:  Head: Normocephalic and atraumatic.  Mouth/Throat: Oropharynx is clear and moist.  Eyes: EOM are normal. Pupils are equal, round, and reactive to light. Right eye exhibits no chemosis and no discharge. Left eye exhibits no chemosis and no discharge. Right conjunctiva is not injected.  Left conjunctiva is injected. No scleral icterus. Right eye exhibits no nystagmus. Left eye exhibits no nystagmus.  Slit lamp exam:      The right eye shows no corneal abrasion, no corneal flare, no corneal ulcer, no fluorescein uptake and no anterior chamber bulge.       The left eye shows no corneal abrasion, no corneal flare, no corneal ulcer, no fluorescein uptake and no anterior chamber bulge.  Neck: Normal range of motion.  Pulmonary/Chest: Effort normal.  Musculoskeletal: Normal range of motion. She exhibits no edema and no tenderness.  Neurological: She is alert and oriented to person, place, and time. She exhibits normal muscle tone. Coordination normal.  Skin: Skin is warm and dry. No rash noted. No erythema. No pallor.  Psychiatric: She has a normal mood and affect. Her behavior is normal. Judgment  and thought content normal.    ED Course  Procedures (including critical care time) DIAGNOSTIC STUDIES: Oxygen Saturation is 100% on room air, normal by my interpretation.    COORDINATION OF CARE: 5:25 PM- Pt advised of plan for treatment and pt agrees.    Labs Review Labs Reviewed - No data to display Imaging Review No results found.   EKG Interpretation None      Medications  tetracaine (PONTOCAINE) 0.5 % ophthalmic solution 1 drop (not administered)  fluorescein ophthalmic strip 1 strip (not administered)  tobramycin (TOBREX) 0.3 % ophthalmic solution 1 drop (not administered)     MDM   Conjunctivitis  Patient here with left eye pain, redness and drainage since yesterday - son had "pink eye" 2 days ago.  No evidence of abrasion.  I personally performed the services described in this documentation, which was scribed in my presence. The recorded information has been reviewed and is accurate.   Izola PriceFrances C. Marisue HumbleSanford, PA-C 06/10/13 1751

## 2013-06-10 NOTE — Discharge Instructions (Signed)
Bacterial Conjunctivitis  Bacterial conjunctivitis, commonly called pink eye, is an inflammation of the clear membrane that covers the white part of the eye (conjunctiva). The inflammation can also happen on the underside of the eyelids. The blood vessels in the conjunctiva become inflamed causing the eye to become red or pink. Bacterial conjunctivitis may spread easily from one eye to another and from person to person (contagious).   CAUSES   Bacterial conjunctivitis is caused by bacteria. The bacteria may come from your own skin, your upper respiratory tract, or from someone else with bacterial conjunctivitis.  SYMPTOMS   The normally white color of the eye or the underside of the eyelid is usually pink or red. The pink eye is usually associated with irritation, tearing, and some sensitivity to light. Bacterial conjunctivitis is often associated with a thick, yellowish discharge from the eye. The discharge may turn into a crust on the eyelids overnight, which causes your eyelids to stick together. If a discharge is present, there may also be some blurred vision in the affected eye.  DIAGNOSIS   Bacterial conjunctivitis is diagnosed by your caregiver through an eye exam and the symptoms that you report. Your caregiver looks for changes in the surface tissues of your eyes, which may point to the specific type of conjunctivitis. A sample of any discharge may be collected on a cotton-tip swab if you have a severe case of conjunctivitis, if your cornea is affected, or if you keep getting repeat infections that do not respond to treatment. The sample will be sent to a lab to see if the inflammation is caused by a bacterial infection and to see if the infection will respond to antibiotic medicines.  TREATMENT   · Bacterial conjunctivitis is treated with antibiotics. Antibiotic eyedrops are most often used. However, antibiotic ointments are also available. Antibiotics pills are sometimes used. Artificial tears or eye  washes may ease discomfort.  HOME CARE INSTRUCTIONS   · To ease discomfort, apply a cool, clean wash cloth to your eye for 10 20 minutes, 3 4 times a day.  · Gently wipe away any drainage from your eye with a warm, wet washcloth or a cotton ball.  · Wash your hands often with soap and water. Use paper towels to dry your hands.  · Do not share towels or wash cloths. This may spread the infection.  · Change or wash your pillow case every day.  · You should not use eye makeup until the infection is gone.  · Do not operate machinery or drive if your vision is blurred.  · Stop using contacts lenses. Ask your caregiver how to sterilize or replace your contacts before using them again. This depends on the type of contact lenses that you use.  · When applying medicine to the infected eye, do not touch the edge of your eyelid with the eyedrop bottle or ointment tube.  SEEK IMMEDIATE MEDICAL CARE IF:   · Your infection has not improved within 3 days after beginning treatment.  · You had yellow discharge from your eye and it returns.  · You have increased eye pain.  · Your eye redness is spreading.  · Your vision becomes blurred.  · You have a fever or persistent symptoms for more than 2 3 days.  · You have a fever and your symptoms suddenly get worse.  · You have facial pain, redness, or swelling.  MAKE SURE YOU:   · Understand these instructions.  · Will watch your   condition.  · Will get help right away if you are not doing well or get worse.  Document Released: 03/30/2005 Document Revised: 12/23/2011 Document Reviewed: 08/31/2011  ExitCare® Patient Information ©2014 [employer].

## 2013-06-10 NOTE — ED Notes (Signed)
Pt c/o L eye pain, swelling, burning, tearing and redness since yesterday. She denies any injuries. States " i think i may have scratched it."

## 2013-06-11 NOTE — ED Provider Notes (Signed)
Medical screening examination/treatment/procedure(s) were performed by non-physician practitioner and as supervising physician I was immediately available for consultation/collaboration.   EKG Interpretation None        Mayerli Kirst H Ellyse Rotolo, MD 06/11/13 0001 

## 2013-06-14 ENCOUNTER — Emergency Department (HOSPITAL_COMMUNITY)
Admission: EM | Admit: 2013-06-14 | Discharge: 2013-06-14 | Disposition: A | Discharge status: Home / Self Care | Payer: Medicaid Other | Attending: Emergency Medicine | Admitting: Emergency Medicine

## 2013-06-14 DIAGNOSIS — F17200 Nicotine dependence, unspecified, uncomplicated: Secondary | ICD-10-CM | POA: Insufficient documentation

## 2013-06-14 DIAGNOSIS — H9209 Otalgia, unspecified ear: Secondary | ICD-10-CM | POA: Insufficient documentation

## 2013-06-14 DIAGNOSIS — J029 Acute pharyngitis, unspecified: Secondary | ICD-10-CM | POA: Insufficient documentation

## 2013-06-14 DIAGNOSIS — Z8619 Personal history of other infectious and parasitic diseases: Secondary | ICD-10-CM | POA: Insufficient documentation

## 2013-06-14 DIAGNOSIS — Z91040 Latex allergy status: Secondary | ICD-10-CM | POA: Insufficient documentation

## 2013-06-14 NOTE — ED Notes (Signed)
Sore throat and left ear pain x 3 days

## 2013-06-14 NOTE — ED Provider Notes (Signed)
Medical screening examination/treatment/procedure(s) were performed by non-physician practitioner and as supervising physician I was immediately available for consultation/collaboration.   EKG Interpretation None      Devoria AlbeIva Brennan Karam, MD, Armando GangFACEP   Ward GivensIva L Morrie Daywalt, MD 06/14/13 [phone]

## 2013-06-14 NOTE — Discharge Instructions (Signed)
Pharyngitis °Pharyngitis is a sore throat (pharynx). There is redness, pain, and swelling of your throat. °HOME CARE  °· Drink enough fluids to keep your pee (urine) clear or pale yellow. °· Only take medicine as told by your doctor. °· You may get sick again if you do not take medicine as told. Finish your medicines, even if you start to feel better. °· Do not take aspirin. °· Rest. °· Rinse your mouth (gargle) with salt water (½ tsp of salt per 1 qt of water) every 1 2 hours. This will help the pain. °· If you are not at risk for choking, you can suck on hard candy or sore throat lozenges. °GET HELP IF: °· You have large, tender lumps on your neck. °· You have a rash. °· You cough up green, yellow-brown, or bloody spit. °GET HELP RIGHT AWAY IF:  °· You have a stiff neck. °· You drool or cannot swallow liquids. °· You throw up (vomit) or are not able to keep medicine or liquids down. °· You have very bad pain that does not go away with medicine. °· You have problems breathing (not from a stuffy nose). °MAKE SURE YOU:  °· Understand these instructions. °· Will watch your condition. °· Will get help right away if you are not doing well or get worse. °Document Released: 09/16/2007 Document Revised: 01/18/2013 Document Reviewed: 12/05/2012 °ExitCare® Patient Information ©2014 [employer]. ° °

## 2013-06-14 NOTE — ED Provider Notes (Signed)
CSN: [id number]     Arrival date & time 06/14/13  0940 History  This chart was scribed for non-physician practitioner Teressa Lower, NP working with Ward Givens, MD by Dorothey Baseman, ED Scribe. This patient was seen in room TR06C/TR06C and the patient's care was started at 10:10 AM.    Chief Complaint  Patient presents with  . Otalgia  . Sore Throat   The history is provided by the patient. No language interpreter was used.   HPI Comments: Jordan Hall is a 28 y.o. female who presents to the Emergency Department complaining of a constant sore throat with associated congestion onset 2-3 days ago and left ear pain onset yesterday. Patient denies taking any medications at home to treat her symptoms. She states that she was recently seen here for conjunctivitis, which has since resolved. She denies fever. Patient has no other pertinent medical history.   Past Medical History  Diagnosis Date  . Hx of chlamydia infection   . Abnormal Pap smear    Past Surgical History  Procedure Laterality Date  . Colposcopy     Family History  Problem Relation Age of Onset  . Asthma Daughter   . Hypertension Mother   . Thyroid disease Mother   . Hypertension Maternal Aunt   . Hypertension Maternal Grandmother   . Thyroid disease Maternal Grandmother    History  Substance Use Topics  . Smoking status: Current Some Day Smoker -- 0.25 packs/day    Types: Cigarettes  . Smokeless tobacco: Never Used  . Alcohol Use: Yes     Comment: occasionally   OB History   Grav Para Term Preterm Abortions TAB SAB Ect Mult Living   8 4 3 1 4 3 1  0 0 2     Review of Systems  Constitutional: Negative for fever.  HENT: Positive for congestion, ear pain and sore throat.   All other systems reviewed and are negative.      Allergies  Latex  Home Medications   Current Outpatient Rx  Name  Route  Sig  Dispense  Refill  . acetaminophen (TYLENOL) 325 MG tablet   Oral   Take 325-650 mg by mouth every 6  (six) hours as needed for mild pain, fever or headache.         . docusate sodium (COLACE) 100 MG capsule   Oral   Take 1 capsule (100 mg total) by mouth 2 (two) times daily as needed.   30 capsule   2   . ibuprofen (ADVIL,MOTRIN) 600 MG tablet   Oral   Take 1 tablet (600 mg total) by mouth every 6 (six) hours as needed for mild pain or moderate pain (pain scale < 4).   60 tablet   2   . oxyCODONE-acetaminophen (PERCOCET/ROXICET) 5-325 MG per tablet   Oral   Take 1-2 tablets by mouth every 6 (six) hours as needed for severe pain (for pain scale > 4).   15 tablet   0    Triage Vitals: BP 138/90  Pulse 75  Temp(Src) 98.3 F (36.8 C) (Oral)  Resp 18  SpO2 100%  Physical Exam  Nursing note and vitals reviewed. Constitutional: She is oriented to person, place, and time. She appears well-developed and well-nourished. No distress.  HENT:  Head: Normocephalic and atraumatic.  Right Ear: Hearing, tympanic membrane, external ear and ear canal normal.  Left Ear: Hearing, tympanic membrane, external ear and ear canal normal.  Mouth/Throat: No oropharyngeal exudate.  Mild pharyngeal  erythema.   Eyes: Conjunctivae are normal.  Neck: Normal range of motion. Neck supple.  Cardiovascular: Normal rate, regular rhythm and normal heart sounds.   Pulmonary/Chest: Effort normal and breath sounds normal. No respiratory distress.  Abdominal: She exhibits no distension.  Musculoskeletal: Normal range of motion.  Neurological: She is alert and oriented to person, place, and time.  Skin: Skin is warm and dry.  Psychiatric: She has a normal mood and affect. Her behavior is normal.    ED Course  Procedures (including critical care time)  DIAGNOSTIC STUDIES: Oxygen Saturation is 100% on room air, normal by my interpretation.    COORDINATION OF CARE: 10:12 AM- Discussed that symptoms are likely viral in nature and advised of symptomatic care. Discussed treatment plan with patient at bedside  and patient verbalized agreement.     Labs Review Labs Reviewed - No data to display Imaging Review No results found.   EKG Interpretation None      MDM   Final diagnoses:  Viral pharyngitis    Likely viral with congestion etc.pt can do symptomatic treatment at home  I personally performed the services described in this documentation, which was scribed in my presence. The recorded information has been reviewed and is accurate.   Teressa LowerVrinda Jaydynn Wolford, NP 06/14/13 1034

## 2013-07-03 ENCOUNTER — Telehealth: Payer: Self-pay | Admitting: *Deleted

## 2013-07-03 ENCOUNTER — Ambulatory Visit: Payer: Medicaid Other | Admitting: Family Medicine

## 2013-07-03 NOTE — Telephone Encounter (Signed)
Spoke with patient concerning missed postpartum appointment on 07/03/2013.  Pt [facility]  Pt had no other concerns.

## 2013-07-10 ENCOUNTER — Emergency Department (HOSPITAL_COMMUNITY): Payer: Medicaid Other

## 2013-07-10 ENCOUNTER — Encounter (HOSPITAL_COMMUNITY): Payer: Self-pay | Admitting: Emergency Medicine

## 2013-07-10 ENCOUNTER — Emergency Department (HOSPITAL_COMMUNITY)
Admission: EM | Admit: 2013-07-10 | Discharge: 2013-07-10 | Disposition: A | Discharge status: Home / Self Care | Payer: Medicaid Other | Attending: Emergency Medicine | Admitting: Emergency Medicine

## 2013-07-10 DIAGNOSIS — F17200 Nicotine dependence, unspecified, uncomplicated: Secondary | ICD-10-CM | POA: Insufficient documentation

## 2013-07-10 DIAGNOSIS — S62609A Fracture of unspecified phalanx of unspecified finger, initial encounter for closed fracture: Secondary | ICD-10-CM

## 2013-07-10 DIAGNOSIS — Y92009 Unspecified place in unspecified non-institutional (private) residence as the place of occurrence of the external cause: Secondary | ICD-10-CM | POA: Insufficient documentation

## 2013-07-10 DIAGNOSIS — Z91040 Latex allergy status: Secondary | ICD-10-CM | POA: Insufficient documentation

## 2013-07-10 DIAGNOSIS — Z8619 Personal history of other infectious and parasitic diseases: Secondary | ICD-10-CM | POA: Insufficient documentation

## 2013-07-10 DIAGNOSIS — W1789XA Other fall from one level to another, initial encounter: Secondary | ICD-10-CM | POA: Insufficient documentation

## 2013-07-10 DIAGNOSIS — IMO0002 Reserved for concepts with insufficient information to code with codable children: Secondary | ICD-10-CM | POA: Insufficient documentation

## 2013-07-10 DIAGNOSIS — Y939 Activity, unspecified: Secondary | ICD-10-CM | POA: Insufficient documentation

## 2013-07-10 MED ORDER — HYDROCODONE-ACETAMINOPHEN 5-325 MG PO TABS
2.0000 | ORAL_TABLET | Freq: Four times a day (QID) | ORAL | Status: DC | PRN
Start: 2013-07-10 — End: 2013-07-27

## 2013-07-10 NOTE — Discharge Instructions (Signed)
RICE: Routine Care for Injuries The routine care of many injuries includes Rest, Ice, Compression, and Elevation (RICE). HOME CARE INSTRUCTIONS  Rest is needed to allow your body to heal. Routine activities can usually be resumed when comfortable. Injured tendons and bones can take up to 6 weeks to heal. Tendons are the cord-like structures that attach muscle to bone.  Ice following an injury helps keep the swelling down and reduces pain.  Put ice in a plastic bag.  Place a towel between your skin and the bag.  Leave the ice on for 15-20 minutes, 03-04 times a day. Do this while awake, for the first 24 to 48 hours. After that, continue as directed by your caregiver.  Compression helps keep swelling down. It also gives support and helps with discomfort. If an elastic bandage has been applied, it should be removed and reapplied every 3 to 4 hours. It should not be applied tightly, but firmly enough to keep swelling down. Watch fingers or toes for swelling, bluish discoloration, coldness, numbness, or excessive pain. If any of these problems occur, remove the bandage and reapply loosely. Contact your caregiver if these problems continue.  Elevation helps reduce swelling and decreases pain. With extremities, such as the arms, hands, legs, and feet, the injured area should be placed near or above the level of the heart, if possible. SEEK IMMEDIATE MEDICAL CARE IF:  You have persistent pain and swelling.  You develop redness, numbness, or unexpected weakness.  Your symptoms are getting worse rather than improving after several days. These symptoms may indicate that further evaluation or further X-rays are needed. Sometimes, X-rays may not show a small broken bone (fracture) until 1 week or 10 days later. Make a follow-up appointment with your caregiver. Ask when your X-ray results will be ready. Make sure you get your X-ray results. Document Released: 07/12/2000 Document Revised: 06/22/2011  Document Reviewed: 08/29/2010 [facility] Patient Information 2014 [place]. Finger Fracture Fractures of fingers are breaks in the bones of the fingers. There are many types of fractures. There are different ways of treating these fractures. Your health care provider will discuss the best way to treat your fracture. CAUSES Traumatic injury is the main cause of broken fingers. These include:  Injuries while playing sports.  Workplace injuries.  Falls. RISK FACTORS Activities that can increase your risk of finger fractures include:  Sports.  Workplace activities that involve machinery.  A condition called osteoporosis, which can make your bones less dense and cause them to fracture more easily. SIGNS AND SYMPTOMS The main symptoms of a broken finger are pain and swelling within 15 minutes after the injury. Other symptoms include:  Bruising of your finger.  Stiffness of your finger.  Numbness of your finger.  Exposed bones (compound fracture) if the fracture is severe. DIAGNOSIS  The best way to diagnose a broken bone is with X-ray imaging. Additionally, your health care provider will use this X-ray image to evaluate the position of the broken finger bones.  TREATMENT  Finger fractures can be treated with:   Nonreduction This means the bones are in place. The finger is splinted without changing the positions of the bone pieces. The splint is usually left on for about a week to 10 days. This will depend on your fracture and what your health care provider thinks.  Closed reduction The bones are put back into position without using surgery. The finger is then splinted.  Open reduction and internal fixation The fracture site is opened.  Then the bone pieces are fixed into place with pins or some type of hardware. This is seldom required. It depends on the severity of the fracture. HOME CARE INSTRUCTIONS   Follow your health care provider's instructions regarding activities,  exercises, and physical therapy.  Only take over-the-counter or prescription medicines for pain, discomfort, or fever as directed by your health care provider. SEEK MEDICAL CARE IF: You have pain or swelling that limits the motion or use of your fingers. SEEK IMMEDIATE MEDICAL CARE IF:  Your finger becomes numb. MAKE SURE YOU:   Understand these instructions.  Will watch your condition.  Will get help right away if you are not doing well or get worse. Document Released: 07/12/2000 Document Revised: 01/18/2013 Document Reviewed: 11/09/2012 [facility] Patient Information 2014 [place], [place].   Follow RICE instructions Splint Go see Dr. Janee Morn tomorrow at 10am Hydrocodone for mod-severe pain, take at night time

## 2013-07-10 NOTE — ED Notes (Signed)
Pt here with significant right hand swelling. sts she fell on it and has been icing it without relief.

## 2013-07-10 NOTE — ED Provider Notes (Signed)
CSN: [id number]     Arrival date & time 07/10/13  1458 History  This chart was scribed for non-physician practitioner, Irish Elders, FNP,working with Dagmar Hait, MD, by Karle Plumber, ED Scribe.  This patient was seen in room TR11C/TR11C and the patient's care was started at 5:01 PM.  Chief Complaint  Patient presents with  . Hand Injury   The history is provided by the patient. No language interpreter was used.   HPI Comments:  Jordan Hall is a 28 y.o. female who presents to the Emergency Department complaining of a right hand injury secondary to falling off her porch approximately 24 hours ago. She reports catching herself on a flexed hand and now reports moderate dorsal hand swelling, bruising and pain. She reports icing the hand and taking Ibuprofen with no relief. She denies any numbness of the hand.    Past Medical History  Diagnosis Date  . Hx of chlamydia infection   . Abnormal Pap smear    Past Surgical History  Procedure Laterality Date  . Colposcopy     Family History  Problem Relation Age of Onset  . Asthma Daughter   . Hypertension Mother   . Thyroid disease Mother   . Hypertension Maternal Aunt   . Hypertension Maternal Grandmother   . Thyroid disease Maternal Grandmother    History  Substance Use Topics  . Smoking status: Current Some Day Smoker -- 0.25 packs/day    Types: Cigarettes  . Smokeless tobacco: Never Used  . Alcohol Use: Yes     Comment: occasionally   OB History   Grav Para Term Preterm Abortions TAB SAB Ect Mult Living   8 4 3 1 4 3 1  0 0 2     Review of Systems  Musculoskeletal: Positive for arthralgias (right hand).  Neurological: Negative for numbness.  All other systems reviewed and are negative.    Allergies  Latex  Home Medications   Current Outpatient Rx  Name  Route  Sig  Dispense  Refill  . acetaminophen (TYLENOL) 325 MG tablet   Oral   Take 325-650 mg by mouth every 6 (six) hours as needed for mild pain,  fever or headache.         . ibuprofen (ADVIL,MOTRIN) 600 MG tablet   Oral   Take 1 tablet (600 mg total) by mouth every 6 (six) hours as needed for mild pain or moderate pain (pain scale < 4).   60 tablet   2   . oxyCODONE-acetaminophen (PERCOCET/ROXICET) 5-325 MG per tablet   Oral   Take 1-2 tablets by mouth every 6 (six) hours as needed for severe pain (for pain scale > 4).   15 tablet   0   . Prenatal Vit-Fe Fumarate-FA (PRENATAL MULTIVITAMIN) TABS tablet   Oral   Take 1 tablet by mouth daily at 12 noon.          Triage Vitals: BP 139/82  Pulse 78  Temp(Src) 98.6 F (37 C) (Oral)  Resp 18  SpO2 98% Physical Exam  Nursing note and vitals reviewed. Constitutional: She is oriented to person, place, and time. She appears well-developed and well-nourished.  HENT:  Head: Normocephalic and atraumatic.  Eyes: EOM are normal.  Neck: Normal range of motion.  Cardiovascular: Normal rate.   Pulmonary/Chest: Effort normal.  Musculoskeletal: Normal range of motion.  Right hand swelling. Limited range of motion of fingers due to pain and swelling. Good sensation distally of all fingers. Brisk capillary refill. Distal  pulses 2+  Neurological: She is alert and oriented to person, place, and time.  Skin: Skin is warm and dry.  Psychiatric: She has a normal mood and affect. Her behavior is normal.    ED Course  Procedures (including critical care time) DIAGNOSTIC STUDIES: Oxygen Saturation is 98% on RA, normal by my interpretation.   COORDINATION OF CARE: 5:04 PM- Will splint hand and give work note.Will give prescription for pain medication prior to discharge. Pt verbalizes understanding and agrees to plan.  Medications - No data to display  Labs Review Labs Reviewed - No data to display Imaging Review Dg Hand Complete Right  07/10/2013   CLINICAL DATA:  Third digit discomfort status post fall ; swelling at the base of the third digit  EXAM: RIGHT HAND - COMPLETE 3+ VIEW   COMPARISON:  None.  FINDINGS: The patient has sustained a mildly displaced fracture involving the base of the proximal phalanx of the third or long finger. The fracture does not appear to enter the joint space. The remainder of the third digit appears intact. The first second fourth and fifth digits are intact. The metacarpals appear normal as do the carpal bones where visualized.  IMPRESSION: The patient has sustained an acute mildly angulated impacted fracture of the base of the proximal phalanx of the third or long finger.   Electronically Signed   By: David  SwazilandJordan   On: 07/10/2013 15:45     EKG Interpretation None      MDM   Final diagnoses:  Finger fracture    Mildly angulated fracture or of the proximal phalanx, third finger, right hand. Radial gutter splint applied. Talked with Dr. Janee Mornhompson and advised patient to follow up in office at 10 AM tomorrow. Prescription for hydrocodone given. Discussed plan of care and she agrees.  I personally performed the services described in this documentation, which was scribed in my presence. The recorded information has been reviewed and is accurate.    Irish EldersKelly Breeana Sawtelle, NP 07/12/13 [phone]

## 2013-07-10 NOTE — Progress Notes (Signed)
Orthopedic Tech Progress Note Patient Details:  Jordan Hall 1985-04-26 [id number]  Ortho Devices Type of Ortho Device: Ace wrap;Volar splint Ortho Device/Splint Location: RUE Ortho Device/Splint Interventions: Ordered;Application   Jennye MoccasinHughes, Tarren Velardi Craig 07/10/2013, 6:15 PM

## 2013-07-10 NOTE — ED Notes (Signed)
Ortho notified to splint finger.

## 2013-07-13 NOTE — ED Provider Notes (Signed)
Medical screening examination/treatment/procedure(s) were performed by non-physician practitioner and as supervising physician I was immediately available for consultation/collaboration.   EKG Interpretation None        Dagmar HaitWilliam Tyeler Goedken, MD 07/13/13 [phone]

## 2013-07-27 ENCOUNTER — Emergency Department (HOSPITAL_COMMUNITY)
Admission: EM | Admit: 2013-07-27 | Discharge: 2013-07-27 | Discharge status: Against Medical Advice | Payer: Medicaid Other | Attending: Emergency Medicine | Admitting: Emergency Medicine

## 2013-07-27 ENCOUNTER — Encounter (HOSPITAL_COMMUNITY): Payer: Self-pay | Admitting: Emergency Medicine

## 2013-07-27 DIAGNOSIS — N390 Urinary tract infection, site not specified: Secondary | ICD-10-CM | POA: Insufficient documentation

## 2013-07-27 LAB — URINE MICROSCOPIC-ADD ON

## 2013-07-27 LAB — URINALYSIS, ROUTINE W REFLEX MICROSCOPIC
Bilirubin Urine: NEGATIVE
GLUCOSE, UA: NEGATIVE mg/dL
HGB URINE DIPSTICK: NEGATIVE
Ketones, ur: NEGATIVE mg/dL
Nitrite: NEGATIVE
PROTEIN: NEGATIVE mg/dL
Specific Gravity, Urine: 1.022 (ref 1.005–1.030)
UROBILINOGEN UA: 0.2 mg/dL (ref 0.0–1.0)
pH: 6 (ref 5.0–8.0)

## 2013-07-27 LAB — PREGNANCY, URINE: Preg Test, Ur: NEGATIVE

## 2013-07-27 NOTE — ED Notes (Signed)
Pt not found - called pt's name multiple times w/ no response - pt not found in any of the waiting areas.

## 2013-07-27 NOTE — ED Notes (Signed)
Unable to locate pt - called pt multiple times w/ no response.

## 2013-07-27 NOTE — ED Notes (Signed)
Called pt for room assignment, no answer after multiple attempts.

## 2013-07-27 NOTE — ED Notes (Signed)
Pt reporting heaviness in lower abdomen for 1 day also vaginal discharge, white, malodorous after using latex condoms which she is allergic to. States urinary burning, frequency. No bleeding.

## 2013-08-02 ENCOUNTER — Encounter (HOSPITAL_COMMUNITY): Payer: Self-pay | Admitting: Emergency Medicine

## 2013-08-02 ENCOUNTER — Emergency Department (HOSPITAL_COMMUNITY)
Admission: EM | Admit: 2013-08-02 | Discharge: 2013-08-02 | Disposition: A | Discharge status: Home / Self Care | Payer: Medicaid Other | Attending: Emergency Medicine | Admitting: Emergency Medicine

## 2013-08-02 DIAGNOSIS — F17200 Nicotine dependence, unspecified, uncomplicated: Secondary | ICD-10-CM | POA: Insufficient documentation

## 2013-08-02 DIAGNOSIS — N760 Acute vaginitis: Secondary | ICD-10-CM | POA: Insufficient documentation

## 2013-08-02 DIAGNOSIS — N898 Other specified noninflammatory disorders of vagina: Secondary | ICD-10-CM

## 2013-08-02 DIAGNOSIS — Z91040 Latex allergy status: Secondary | ICD-10-CM | POA: Insufficient documentation

## 2013-08-02 DIAGNOSIS — B9689 Other specified bacterial agents as the cause of diseases classified elsewhere: Secondary | ICD-10-CM

## 2013-08-02 DIAGNOSIS — Z8619 Personal history of other infectious and parasitic diseases: Secondary | ICD-10-CM | POA: Insufficient documentation

## 2013-08-02 LAB — WET PREP, GENITAL
TRICH WET PREP: NONE SEEN
Yeast Wet Prep HPF POC: NONE SEEN

## 2013-08-02 LAB — URINALYSIS, ROUTINE W REFLEX MICROSCOPIC
Bilirubin Urine: NEGATIVE
Glucose, UA: NEGATIVE mg/dL
Hgb urine dipstick: NEGATIVE
Ketones, ur: NEGATIVE mg/dL
Leukocytes, UA: NEGATIVE
NITRITE: NEGATIVE
PROTEIN: NEGATIVE mg/dL
SPECIFIC GRAVITY, URINE: 1.017 (ref 1.005–1.030)
Urobilinogen, UA: 0.2 mg/dL (ref 0.0–1.0)
pH: 6.5 (ref 5.0–8.0)

## 2013-08-02 LAB — POC URINE PREG, ED: PREG TEST UR: NEGATIVE

## 2013-08-02 MED ORDER — METRONIDAZOLE 500 MG PO TABS: 500.0000 mg | ORAL_TABLET | Freq: Two times a day (BID) | ORAL | Status: DC

## 2013-08-02 NOTE — ED Notes (Signed)
Pelvic cart set up at bedside  

## 2013-08-02 NOTE — Discharge Instructions (Signed)
Call an Gynecologist for further evaluation of your abnormal vaginal discharge and possible ultrasound. If you change your mind about completing your evaluation, please come back to the Emergency Department to complete your work up and ultrasound. Call for a follow up appointment with a Family or Primary Care Provider.  Return to emergency department if Symptoms worsen, he develop abdominal pain, worsening vaginal discharge, pelvic pain. Take medication as prescribed.

## 2013-08-02 NOTE — ED Notes (Signed)
Pt reports vaginal discharge with odor after using latex condoms which she is allergic to. Also requesting birth control pills.

## 2013-08-02 NOTE — ED Notes (Addendum)
Lauren PA-C made aware of patients request sts patient will have to sign out AMA if she cannot stay and follow up out-patient at [facility] for results and ultrasound.   Patient made aware sts will wait for wet-prep results but will do US outpatient with OBGYN.  Lauren PA-C made aware.

## 2013-08-02 NOTE — ED Provider Notes (Signed)
CSN: [id number]     Arrival date & time 08/02/13  1049 History   First MD Initiated Contact with Patient 08/02/13 1151     Chief Complaint  Patient presents with  . Vaginal Discharge     (Consider location/radiation/quality/duration/timing/severity/associated sxs/prior Treatment) HPI Comments: The patient is a 28 year old female past medical history of Chlamydia, ectopic pregnancy, presenting to the emergency room with a chief complaint of abnormal vaginal discharge for one week. The patient reports she had a stillborn delivery on 06/01/2013. She has not followed up with an OB gynecologist.  The patient reports malodorous discharge after using latex condoms, she reports she is allergic to latex.  She reports her last menses over 6 months ago, and menses has not returned since delivery.  She reports she did not had 6 weeks of pelvic rest after her delivery. She denies abdominal pain.  Patient is a 28 y.o. female presenting with vaginal discharge. The history is provided by the patient and medical records. No language interpreter was used.  Vaginal Discharge Associated symptoms: no abdominal pain, no dysuria, no fever, no nausea and no vomiting     Past Medical History  Diagnosis Date  . Hx of chlamydia infection   . Abnormal Pap smear    Past Surgical History  Procedure Laterality Date  . Colposcopy     Family History  Problem Relation Age of Onset  . Asthma Daughter   . Hypertension Mother   . Thyroid disease Mother   . Hypertension Maternal Aunt   . Hypertension Maternal Grandmother   . Thyroid disease Maternal Grandmother    History  Substance Use Topics  . Smoking status: Current Some Day Smoker -- 0.25 packs/day    Types: Cigarettes  . Smokeless tobacco: Never Used  . Alcohol Use: Yes     Comment: occasionally   OB History   Grav Para Term Preterm Abortions TAB SAB Ect Mult Living   8 4 3 1 4 3 1  0 0 2     Review of Systems  Constitutional: Negative for fever and  chills.  Gastrointestinal: Negative for nausea, vomiting, abdominal pain and constipation.  Genitourinary: Positive for vaginal discharge. Negative for dysuria, vaginal bleeding and pelvic pain.  All other systems reviewed and are negative.     Allergies  Latex  Home Medications   Prior to Admission medications   Not on File   BP 139/78  Pulse 65  Temp(Src) 97.3 F (36.3 C) (Oral)  Resp 17  Ht 5\' 3"  (1.6 m)  Wt 160 lb (72.576 kg)  BMI 28.35 kg/m2  SpO2 100% Physical Exam  Nursing note and vitals reviewed. Constitutional: She appears well-developed and well-nourished. No distress.  HENT:  Head: Normocephalic and atraumatic.  Eyes: EOM are normal. Pupils are equal, round, and reactive to light.  Neck: Neck supple.  Cardiovascular: Normal rate and regular rhythm.   Pulmonary/Chest: Effort normal and breath sounds normal. No respiratory distress. She has no wheezes. She has no rales. She exhibits no tenderness.  Abdominal: Soft. She exhibits no distension and no mass. There is no tenderness. There is no rebound and no guarding.  Genitourinary: Uterus is not tender. Cervix exhibits discharge. Cervix exhibits no motion tenderness. Right adnexum displays no mass and no tenderness. Left adnexum displays no mass.  Cervical os slightly dilated, minimal amount of vaginal discharge in the posterior vaginal vault. Vaginal walls slightly erythremic. Chaperone present.  Musculoskeletal: Normal range of motion.  Neurological: She is alert.  Skin: Skin  is warm and dry. She is not diaphoretic.  Psychiatric: She has a normal mood and affect. Her behavior is normal.    ED Course  Procedures (including critical care time) Labs Review Labs Reviewed  WET PREP, GENITAL - Abnormal; Notable for the following:    Clue Cells Wet Prep HPF POC MANY (*)    WBC, Wet Prep HPF POC MANY (*)    All other components within normal limits  GC/CHLAMYDIA PROBE AMP  URINALYSIS, ROUTINE W REFLEX MICROSCOPIC   POC URINE PREG, ED     Imaging Review No results found.   EKG Interpretation None      MDM   Final diagnoses:  Vaginal discharge  BV (bacterial vaginosis)   Patient presents with abnormal vaginal discharge after a stillborn delivery and questionable latex allergy. She has not followed up with an OB gynecologist. Pelvic exam shows minimal blood and slightly dilated cervical os.  Discussed findings with Dr. Romeo AppleHarrison who also evaluated the patient separately.  Pt refusing US at this time. Encouraged follow up with OB/GYN for further evaluation of abnormal discharge and possible retained products of conception. Discussed lab results, and treatment plan with the patient. Return precautions given. Reports understanding and no other concerns at this time.  Patient is stable for discharge at this time.  Meds given in ED:  Medications - No data to display  Discharge Medication List as of 08/02/2013  2:01 PM    START taking these medications   Details  metroNIDAZOLE (FLAGYL) 500 MG tablet Take 1 tablet (500 mg total) by mouth 2 (two) times daily., Starting 08/02/2013, Until Discontinued, Print           Clabe SealLauren M Venida Tsukamoto, PA-C 08/04/13 [phone]

## 2013-08-03 LAB — GC/CHLAMYDIA PROBE AMP
CT Probe RNA: NEGATIVE
GC Probe RNA: NEGATIVE

## 2013-08-06 NOTE — ED Provider Notes (Signed)
Medical screening examination/treatment/procedure(s) were conducted as a shared visit with non-physician practitioner(s) and myself.  I personally evaluated the patient during the encounter.  I interviewed and examined the patient. Lungs are CTAB. Cardiac exam wnl. Abdomen soft. Pt appears well on exam. VS unremarkable. Recommended US, but pt would prefer pelvic exam and f/u. Doubt retained poc as stillborn delivery was 2 mos ago and pt has not had any sx until now. Pt notes possible latex allergy and recent sex w/ use of a condom. Possibly a reaction to the latex. Will rec close f/u w/ pcp.   Junius ArgyleForrest S Heath Tesler, MD 08/06/13 1018

## 2014-02-12 ENCOUNTER — Encounter (HOSPITAL_COMMUNITY): Payer: Self-pay | Admitting: Emergency Medicine

## 2014-03-07 ENCOUNTER — Emergency Department (HOSPITAL_COMMUNITY)
Admission: EM | Admit: 2014-03-07 | Discharge: 2014-03-07 | Discharge status: Against Medical Advice | Payer: Medicaid Other | Attending: Emergency Medicine | Admitting: Emergency Medicine

## 2014-03-07 ENCOUNTER — Encounter (HOSPITAL_COMMUNITY): Payer: Self-pay | Admitting: Emergency Medicine

## 2014-03-07 DIAGNOSIS — N898 Other specified noninflammatory disorders of vagina: Secondary | ICD-10-CM | POA: Insufficient documentation

## 2014-03-07 DIAGNOSIS — Z720 Tobacco use: Secondary | ICD-10-CM | POA: Diagnosis not present

## 2014-03-07 NOTE — ED Notes (Signed)
Pt. reports malodorous vaginal discharge during sexual encounter with boyfriend this evening , denies dysuria / no fever or chills.

## 2014-03-10 ENCOUNTER — Encounter (HOSPITAL_COMMUNITY): Payer: Self-pay | Admitting: *Deleted

## 2014-03-10 ENCOUNTER — Emergency Department (HOSPITAL_COMMUNITY)
Admission: EM | Admit: 2014-03-10 | Discharge: 2014-03-10 | Disposition: A | Discharge status: Home / Self Care | Payer: Medicaid Other | Attending: Emergency Medicine | Admitting: Emergency Medicine

## 2014-03-10 DIAGNOSIS — N760 Acute vaginitis: Secondary | ICD-10-CM | POA: Diagnosis not present

## 2014-03-10 DIAGNOSIS — N898 Other specified noninflammatory disorders of vagina: Secondary | ICD-10-CM | POA: Diagnosis present

## 2014-03-10 DIAGNOSIS — Z3202 Encounter for pregnancy test, result negative: Secondary | ICD-10-CM | POA: Insufficient documentation

## 2014-03-10 DIAGNOSIS — Z720 Tobacco use: Secondary | ICD-10-CM | POA: Diagnosis not present

## 2014-03-10 DIAGNOSIS — B9689 Other specified bacterial agents as the cause of diseases classified elsewhere: Secondary | ICD-10-CM

## 2014-03-10 DIAGNOSIS — Z91040 Latex allergy status: Secondary | ICD-10-CM | POA: Diagnosis not present

## 2014-03-10 LAB — URINALYSIS, ROUTINE W REFLEX MICROSCOPIC
Bilirubin Urine: NEGATIVE
Glucose, UA: NEGATIVE mg/dL
Hgb urine dipstick: NEGATIVE
Ketones, ur: NEGATIVE mg/dL
Leukocytes, UA: NEGATIVE
Nitrite: NEGATIVE
Protein, ur: NEGATIVE mg/dL
Specific Gravity, Urine: 1.027 (ref 1.005–1.030)
Urobilinogen, UA: 0.2 mg/dL (ref 0.0–1.0)
pH: 7 (ref 5.0–8.0)

## 2014-03-10 LAB — WET PREP, GENITAL
Trich, Wet Prep: NONE SEEN
WBC, Wet Prep HPF POC: NONE SEEN
Yeast Wet Prep HPF POC: NONE SEEN

## 2014-03-10 LAB — PREGNANCY, URINE: Preg Test, Ur: NEGATIVE

## 2014-03-10 MED ORDER — METRONIDAZOLE 500 MG PO TABS
2000.0000 mg | ORAL_TABLET | Freq: Once | ORAL | Status: AC
  Administered 2014-03-10: 2000 mg via ORAL
  Filled 2014-03-10: qty 4

## 2014-03-10 MED ORDER — ONDANSETRON 4 MG PO TBDP
4.0000 mg | ORAL_TABLET | Freq: Once | ORAL | Status: AC
  Administered 2014-03-10: 4 mg via ORAL
  Filled 2014-03-10: qty 1

## 2014-03-10 NOTE — ED Notes (Signed)
Pt reports that she has a vaginal bacterial infection, having vaginal discharge and odor. No acute distress noted at triage.

## 2014-03-10 NOTE — Discharge Instructions (Signed)
Bacterial Vaginosis Bacterial vaginosis is a vaginal infection that occurs when the normal balance of bacteria in the vagina is disrupted. It results from an overgrowth of certain bacteria. This is the most common vaginal infection in women of childbearing age. Treatment is important to prevent complications, especially in pregnant women, as it can cause a premature delivery. CAUSES  Bacterial vaginosis is caused by an increase in harmful bacteria that are normally present in smaller amounts in the vagina. Several different kinds of bacteria can cause bacterial vaginosis. However, the reason that the condition develops is not fully understood. RISK FACTORS Certain activities or behaviors can put you at an increased risk of developing bacterial vaginosis, including:  Having a new sex partner or multiple sex partners.  Douching.  Using an intrauterine device (IUD) for contraception. Women do not get bacterial vaginosis from toilet seats, bedding, swimming pools, or contact with objects around them. SIGNS AND SYMPTOMS  Some women with bacterial vaginosis have no signs or symptoms. Common symptoms include:  Grey vaginal discharge.  A fishlike odor with discharge, especially after sexual intercourse.  Itching or burning of the vagina and vulva.  Burning or pain with urination. DIAGNOSIS  Your health care provider will take a medical history and examine the vagina for signs of bacterial vaginosis. A sample of vaginal fluid may be taken. Your health care provider will look at this sample under a microscope to check for bacteria and abnormal cells. A vaginal pH test may also be done.  TREATMENT  Bacterial vaginosis may be treated with antibiotic medicines. These may be given in the form of a pill or a vaginal cream. A second round of antibiotics may be prescribed if the condition comes back after treatment.  HOME CARE INSTRUCTIONS   Only take over-the-counter or prescription medicines as  directed by your health care provider.  If antibiotic medicine was prescribed, take it as directed. Make sure you finish it even if you start to feel better.  Do not have sex until treatment is completed.  Tell all sexual partners that you have a vaginal infection. They should see their health care provider and be treated if they have problems, such as a mild rash or itching.  Practice safe sex by using condoms and only having one sex partner. SEEK MEDICAL CARE IF:   Your symptoms are not improving after 3 days of treatment.  You have increased discharge or pain.  You have a fever. MAKE SURE YOU:   Understand these instructions.  Will watch your condition.  Will get help right away if you are not doing well or get worse. FOR MORE INFORMATION  Centers for Disease Control and Prevention, Division of STD Prevention: www.cdc.gov/std American Sexual Health Association (ASHA): www.ashastd.org  Document Released: 03/30/2005 Document Revised: 01/18/2013 Document Reviewed: 11/09/2012 Exit[employer] Patient Information 2015 [employer]. This information is not intended to replace advice given to you by your health care provider. Make sure you discuss any questions you have with your health care provider.  

## 2014-03-10 NOTE — ED Notes (Signed)
Pt reports smell and slight discharge. Denies any pelvic pain, fevers or any other sx. Pt reports last period around 02/28/14.

## 2014-03-13 LAB — GC/CHLAMYDIA PROBE AMP
CT Probe RNA: NEGATIVE
GC Probe RNA: NEGATIVE

## 2014-03-14 NOTE — ED Provider Notes (Signed)
CSN: [id number]     Arrival date & time 03/10/14  1635 History   First MD Initiated Contact with Patient 03/10/14 2138     Chief Complaint  Patient presents with  . Vaginal Discharge     (Consider location/radiation/quality/duration/timing/severity/associated sxs/prior Treatment) HPI   [age] y female with vaginal discharge. Malodorous. Sall amount of itching. Onset this evening. She is sexually active. No urinary complaints. No abdominal or back pain. No fevers or chills. Does not think she is pregnant. No nausea vomiting. No unusual vaginal bleeding.  Past Medical History  Diagnosis Date  . Hx of chlamydia infection   . Abnormal Pap smear    Past Surgical History  Procedure Laterality Date  . Colposcopy     Family History  Problem Relation Age of Onset  . Asthma Daughter   . Hypertension Mother   . Thyroid disease Mother   . Hypertension Maternal Aunt   . Hypertension Maternal Grandmother   . Thyroid disease Maternal Grandmother    History  Substance Use Topics  . Smoking status: Current Some Day Smoker -- 0.25 packs/day    Types: Cigarettes  . Smokeless tobacco: Never Used  . Alcohol Use: Yes     Comment: occasionally   OB History    Gravida Para Term Preterm AB TAB SAB Ectopic Multiple Living   8 4 3 1 4 3 1  0 0 2     Review of Systems  All systems reviewed and negative, other than as noted in HPI.   Allergies  Latex  Home Medications   Prior to Admission medications   Medication Sig Start Date End Date Taking? Authorizing Provider  ibuprofen (ADVIL,MOTRIN) 200 MG tablet Take 200 mg by mouth daily as needed for headache.   Yes Historical Provider, MD  metroNIDAZOLE (FLAGYL) 500 MG tablet Take 1 tablet (500 mg total) by mouth 2 (two) times daily. Patient not taking: Reported on 03/10/2014 08/02/13   Mellody DrownLauren Parker, PA-C   BP 135/88 mmHg  Pulse 68  Temp(Src) 98.2 F (36.8 C) (Oral)  Resp 16  SpO2 99%  LMP 02/28/2014 Physical Exam  Constitutional: She  appears well-developed and well-nourished. No distress.  HENT:  Head: Normocephalic and atraumatic.  Eyes: Conjunctivae are normal. Right eye exhibits no discharge. Left eye exhibits no discharge.  Neck: Neck supple.  Cardiovascular: Normal rate, regular rhythm and normal heart sounds.  Exam reveals no gallop and no friction rub.   No murmur heard. Pulmonary/Chest: Effort normal and breath sounds normal. No respiratory distress.  Abdominal: Soft. She exhibits no distension. There is no tenderness.  Genitourinary:  Chaperone present. Normal external female genitalia. Moderate amount of thin white/grayish discharge. Fishy odor. Cervix unremarkable. No CMT. No appreciable adnexal mass or tenderness.  Musculoskeletal: She exhibits no edema or tenderness.  Neurological: She is alert.  Skin: Skin is warm and dry.  Psychiatric: She has a normal mood and affect. Her behavior is normal. Thought content normal.  Nursing note and vitals reviewed.   ED Course  Procedures (including critical care time) Labs Review Labs Reviewed  WET PREP, GENITAL - Abnormal; Notable for the following:    Clue Cells Wet Prep HPF POC MANY (*)    All other components within normal limits  URINALYSIS, ROUTINE W REFLEX MICROSCOPIC - Abnormal; Notable for the following:    APPearance CLOUDY (*)    All other components within normal limits  GC/CHLAMYDIA PROBE AMP  PREGNANCY, URINE    Imaging Review No results found.  EKG Interpretation None      MDM   Final diagnoses:  Bacterial vaginosis    28 year old female with bacterial vaginosis. Single dose of Flagyl per her request. Pelvic otherwise unremarkable. Not pregnant.    Raeford RazorStephen Pamelia Botto, MD 03/14/14 2134

## 2014-09-06 ENCOUNTER — Encounter (HOSPITAL_COMMUNITY): Payer: Self-pay | Admitting: Emergency Medicine

## 2014-09-06 ENCOUNTER — Emergency Department (HOSPITAL_COMMUNITY)
Admission: EM | Admit: 2014-09-06 | Discharge: 2014-09-06 | Disposition: A | Discharge status: Home / Self Care | Payer: Medicaid Other | Attending: Emergency Medicine | Admitting: Emergency Medicine

## 2014-09-06 DIAGNOSIS — A5901 Trichomonal vulvovaginitis: Secondary | ICD-10-CM | POA: Diagnosis not present

## 2014-09-06 DIAGNOSIS — B373 Candidiasis of vulva and vagina: Secondary | ICD-10-CM | POA: Insufficient documentation

## 2014-09-06 DIAGNOSIS — Z91040 Latex allergy status: Secondary | ICD-10-CM | POA: Insufficient documentation

## 2014-09-06 DIAGNOSIS — Z3202 Encounter for pregnancy test, result negative: Secondary | ICD-10-CM | POA: Insufficient documentation

## 2014-09-06 DIAGNOSIS — Z720 Tobacco use: Secondary | ICD-10-CM | POA: Insufficient documentation

## 2014-09-06 DIAGNOSIS — B3731 Acute candidiasis of vulva and vagina: Secondary | ICD-10-CM

## 2014-09-06 DIAGNOSIS — N898 Other specified noninflammatory disorders of vagina: Secondary | ICD-10-CM | POA: Diagnosis present

## 2014-09-06 LAB — URINE MICROSCOPIC-ADD ON

## 2014-09-06 LAB — URINALYSIS, ROUTINE W REFLEX MICROSCOPIC
BILIRUBIN URINE: NEGATIVE
Glucose, UA: NEGATIVE mg/dL
Hgb urine dipstick: NEGATIVE
Ketones, ur: NEGATIVE mg/dL
NITRITE: NEGATIVE
PROTEIN: NEGATIVE mg/dL
Specific Gravity, Urine: 1.024 (ref 1.005–1.030)
Urobilinogen, UA: 0.2 mg/dL (ref 0.0–1.0)
pH: 6 (ref 5.0–8.0)

## 2014-09-06 LAB — WET PREP, GENITAL: Clue Cells Wet Prep HPF POC: NONE SEEN

## 2014-09-06 LAB — POC URINE PREG, ED: PREG TEST UR: NEGATIVE

## 2014-09-06 MED ORDER — METRONIDAZOLE 500 MG PO TABS
2000.0000 mg | ORAL_TABLET | Freq: Once | ORAL | Status: AC
  Administered 2014-09-06: 2000 mg via ORAL
  Filled 2014-09-06: qty 4

## 2014-09-06 MED ORDER — FLUCONAZOLE 100 MG PO TABS
150.0000 mg | ORAL_TABLET | Freq: Once | ORAL | Status: AC
Start: 2014-09-06 — End: 2014-09-06
  Administered 2014-09-06: 150 mg via ORAL
  Filled 2014-09-06: qty 2

## 2014-09-06 NOTE — ED Notes (Signed)
To ED via private vehicle with c/o yeast infection after 28 day course of antibiotic for dental abcess. White discharge.

## 2014-09-06 NOTE — Discharge Instructions (Signed)
Sexually Transmitted Disease °A sexually transmitted disease (STD) is a disease or infection that may be passed (transmitted) from person to person, usually during sexual activity. This may happen by way of saliva, semen, blood, vaginal mucus, or urine. Common STDs include:  °· Gonorrhea.   °· Chlamydia.   °· Syphilis.   °· HIV and AIDS.   °· Genital herpes.   °· Hepatitis B and C.   °· Trichomonas.   °· Human papillomavirus (HPV).   °· Pubic lice.   °· Scabies. °· Mites. °· Bacterial vaginosis. °WHAT ARE CAUSES OF STDs? °An STD may be caused by bacteria, a virus, or parasites. STDs are often transmitted during sexual activity if one person is infected. However, they may also be transmitted through nonsexual means. STDs may be transmitted after:  °· Sexual intercourse with an infected person.   °· Sharing sex toys with an infected person.   °· Sharing needles with an infected person or using unclean piercing or tattoo needles. °· Having intimate contact with the genitals, mouth, or rectal areas of an infected person.   °· Exposure to infected fluids during birth. °WHAT ARE THE SIGNS AND SYMPTOMS OF STDs? °Different STDs have different symptoms. Some people may not have any symptoms. If symptoms are present, they may include:  °· Painful or bloody urination.   °· Pain in the pelvis, abdomen, vagina, anus, throat, or eyes.   °· A skin rash, itching, or irritation. °· Growths, ulcerations, blisters, or sores in the genital and anal areas. °· Abnormal vaginal discharge with or without bad odor.   °· Penile discharge in men.   °· Fever.   °· Pain or bleeding during sexual intercourse.   °· Swollen glands in the groin area.   °· Yellow skin and eyes (jaundice). This is seen with hepatitis.   °· Swollen testicles. °· Infertility. °· Sores and blisters in the mouth. °HOW ARE STDs DIAGNOSED? °To make a diagnosis, your health care provider may:  °· Take a medical history.   °· Perform a physical exam.   °· Take a sample of  any discharge to examine. °· Swab the throat, cervix, opening to the penis, rectum, or vagina for testing. °· Test a sample of your first morning urine.   °· Perform blood tests.   °· Perform a Pap test, if this applies.   °· Perform a colposcopy.   °· Perform a laparoscopy.   °HOW ARE STDs TREATED? ° Treatment depends on the STD. Some STDs may be treated but not cured.  °· Chlamydia, gonorrhea, trichomonas, and syphilis can be cured with antibiotic medicine.   °· Genital herpes, hepatitis, and HIV can be treated, but not cured, with prescribed medicines. The medicines lessen symptoms.   °· Genital warts from HPV can be treated with medicine or by freezing, burning (electrocautery), or surgery. Warts may come back.   °· HPV cannot be cured with medicine or surgery. However, abnormal areas may be removed from the cervix, vagina, or vulva.   °· If your diagnosis is confirmed, your recent sexual partners need treatment. This is true even if they are symptom-free or have a negative culture or evaluation. They should not have sex until their health care providers say it is okay. °HOW CAN I REDUCE MY RISK OF GETTING AN STD? °Take these steps to reduce your risk of getting an STD: °· Use latex condoms, dental dams, and water-soluble lubricants during sexual activity. Do not use petroleum jelly or oils. °· Avoid having multiple sex partners. °· Do not have sex with someone who has other sex partners. °· Do not have sex with anyone you do not know or who is at   high risk for an STD.  Avoid risky sex practices that can break your skin.  Do not have sex if you have open sores on your mouth or skin.  Avoid drinking too much alcohol or taking illegal drugs. Alcohol and drugs can affect your judgment and put you in a vulnerable position.  Avoid engaging in oral and anal sex acts.  Get vaccinated for HPV and hepatitis. If you have not received these vaccines in the past, talk to your health care provider about whether one  or both might be right for you.   If you are at risk of being infected with HIV, it is recommended that you take a prescription medicine daily to prevent HIV infection. This is called pre-exposure prophylaxis (PrEP). You are considered at risk if:  You are a man who has sex with other men (MSM).  You are a heterosexual man or woman and are sexually active with more than one partner.  You take drugs by injection.  You are sexually active with a partner who has HIV.  Talk with your health care provider about whether you are at high risk of being infected with HIV. If you choose to begin PrEP, you should first be tested for HIV. You should then be tested every 3 months for as long as you are taking PrEP.  WHAT SHOULD I DO IF I THINK I HAVE AN STD?  See your health care provider.   Tell your sexual partner(s). They should be tested and treated for any STDs.  Do not have sex until your health care provider says it is okay. WHEN SHOULD I GET IMMEDIATE MEDICAL CARE? Contact your health care provider right away if:   You have severe abdominal pain.  You are a man and notice swelling or pain in your testicles.  You are a woman and notice swelling or pain in your vagina. Document Released: 06/20/2002 Document Revised: 04/04/2013 Document Reviewed: 10/18/2012 [facility] Patient Information 2015 [place], [place]. This information is not intended to replace advice given to you by your health care provider. Make sure you discuss any questions you have with your health care provider.  Candida Infection A Candida infection (also called yeast, fungus, and Monilia infection) is an overgrowth of yeast that can occur anywhere on the body. A yeast infection commonly occurs in warm, moist body areas. Usually, the infection remains localized but can spread to become a systemic infection. A yeast infection may be a sign of a more severe disease such as diabetes, leukemia, or AIDS. A yeast infection can  occur in both men and women. In women, Candida vaginitis is a vaginal infection. It is one of the most common causes of vaginitis. Men usually do not have symptoms or know they have an infection until other problems develop. Men may find out they have a yeast infection because their sex partner has a yeast infection. Uncircumcised men are more likely to get a yeast infection than circumcised men. This is because the uncircumcised glans is not exposed to air and does not remain as dry as that of a circumcised glans. Older adults may develop yeast infections around dentures. CAUSES  Women  Antibiotics.  Steroid medication taken for a long time.  Being overweight (obese).  Diabetes.  Poor immune condition.  Certain serious medical conditions.  Immune suppressive medications for organ transplant patients.  Chemotherapy.  Pregnancy.  Menstruation.  Stress and fatigue.  Intravenous drug use.  Oral contraceptives.  Wearing tight-fitting clothes in the crotch  area.  Catching it from a sex partner who has a yeast infection.  Spermicide.  Intravenous, urinary, or other catheters. Men  Catching it from a sex partner who has a yeast infection.  Having oral or anal sex with a person who has the infection.  Spermicide.  Diabetes.  Antibiotics.  Poor immune system.  Medications that suppress the immune system.  Intravenous drug use.  Intravenous, urinary, or other catheters. SYMPTOMS  Women  Thick, white vaginal discharge.  Vaginal itching.  Redness and swelling in and around the vagina.  Irritation of the lips of the vagina and perineum.  Blisters on the vaginal lips and perineum.  Painful sexual intercourse.  Low blood sugar (hypoglycemia).  Painful urination.  Bladder infections.  Intestinal problems such as constipation, indigestion, bad breath, bloating, increase in gas, diarrhea, or loose stools. Men  Men may develop intestinal problems such as  constipation, indigestion, bad breath, bloating, increase in gas, diarrhea, or loose stools.  Dry, cracked skin on the penis with itching or discomfort.  Jock itch.  Dry, flaky skin.  Athlete's foot.  Hypoglycemia. DIAGNOSIS  Women  A history and an exam are performed.  The discharge may be examined under a microscope.  A culture may be taken of the discharge. Men  A history and an exam are performed.  Any discharge from the penis or areas of cracked skin will be looked at under the microscope and cultured.  Stool samples may be cultured. TREATMENT  Women  Vaginal antifungal suppositories and creams.  Medicated creams to decrease irritation and itching on the outside of the vagina.  Warm compresses to the perineal area to decrease swelling and discomfort.  Oral antifungal medications.  Medicated vaginal suppositories or cream for repeated or recurrent infections.  Wash and dry the irritation areas before applying the cream.  Eating yogurt with Lactobacillus may help with prevention and treatment.  Sometimes painting the vagina with gentian violet solution may help if creams and suppositories do not work. Men  Antifungal creams and oral antifungal medications.  Sometimes treatment must continue for 30 days after the symptoms go away to prevent recurrence. HOME CARE INSTRUCTIONS  Women  Use cotton underwear and avoid tight-fitting clothing.  Avoid colored, scented toilet paper and deodorant tampons or pads.  Do not douche.  Keep your diabetes under control.  Finish all the prescribed medications.  Keep your skin clean and dry.  Consume milk or yogurt with Lactobacillus-active culture regularly. If you get frequent yeast infections and think that is what the infection is, there are over-the-counter medications that you can get. If the infection does not show healing in 3 days, talk to your caregiver.  Tell your sex partner you have a yeast infection.  Your partner may need treatment also, especially if your infection does not clear up or recurs. Men  Keep your skin clean and dry.  Keep your diabetes under control.  Finish all prescribed medications.  Tell your sex partner that you have a yeast infection so he or she can be treated if necessary. SEEK MEDICAL CARE IF:   Your symptoms do not clear up or worsen in one week after treatment.  You have an oral temperature above 102 F (38.9 C).  You have trouble swallowing or eating for a prolonged time.  You develop blisters on and around your vagina.  You develop vaginal bleeding and it is not your menstrual period.  You develop abdominal pain.  You develop intestinal problems as mentioned above.  You get weak or light-headed.  You have painful or increased urination.  You have pain during sexual intercourse. MAKE SURE YOU:   Understand these instructions.  Will watch your condition.  Will get help right away if you are not doing well or get worse. Document Released: 05/07/2004 Document Revised: 08/14/2013 Document Reviewed: 08/19/2009 [facility] Patient Information 2015 [place], [place]. This information is not intended to replace advice given to you by your health care provider. Make sure you discuss any questions you have with your health care provider.  Trichomoniasis Trichomoniasis is an infection caused by an organism called Trichomonas. The infection can affect both women and men. In women, the outer female genitalia and the vagina are affected. In men, the penis is mainly affected, but the prostate and other reproductive organs can also be involved. Trichomoniasis is a sexually transmitted infection (STI) and is most often passed to another person through sexual contact.  RISK FACTORS  Having unprotected sexual intercourse.  Having sexual intercourse with an infected partner. SIGNS AND SYMPTOMS  Symptoms of trichomoniasis in women include:  Abnormal gray-green  frothy vaginal discharge.  Itching and irritation of the vagina.  Itching and irritation of the area outside the vagina. Symptoms of trichomoniasis in men include:   Penile discharge with or without pain.  Pain during urination. This results from inflammation of the urethra. DIAGNOSIS  Trichomoniasis may be found during a Pap test or physical exam. Your health care provider may use one of the following methods to help diagnose this infection:  Examining vaginal discharge under a microscope. For men, urethral discharge would be examined.  Testing the pH of the vagina with a test tape.  Using a vaginal swab test that checks for the Trichomonas organism. A test is available that provides results within a few minutes.  Doing a culture test for the organism. This is not usually needed. TREATMENT   You may be given medicine to fight the infection. Women should inform their health care provider if they could be or are pregnant. Some medicines used to treat the infection should not be taken during pregnancy.  Your health care provider may recommend over-the-counter medicines or creams to decrease itching or irritation.  Your sexual partner will need to be treated if infected. HOME CARE INSTRUCTIONS   Take medicines only as directed by your health care provider.  Take over-the-counter medicine for itching or irritation as directed by your health care provider.  Do not have sexual intercourse while you have the infection.  Women should not douche or wear tampons while they have the infection.  Discuss your infection with your partner. Your partner may have gotten the infection from you, or you may have gotten it from your partner.  Have your sex partner get examined and treated if necessary.  Practice safe, informed, and protected sex.  See your health care provider for other STI testing. SEEK MEDICAL CARE IF:   You still have symptoms after you finish your medicine.  You  develop abdominal pain.  You have pain when you urinate.  You have bleeding after sexual intercourse.  You develop a rash.  Your medicine makes you sick or makes you throw up (vomit). MAKE SURE YOU:  Understand these instructions.  Will watch your condition.  Will get help right away if you are not doing well or get worse. Document Released: 09/23/2000 Document Revised: 08/14/2013 Document Reviewed: 01/09/2013 [facility] Patient Information 2015 [place], [place]. This information is not intended to replace advice given to  you by your health care provider. Make sure you discuss any questions you have with your health care provider.  Trichomoniasis Trichomoniasis is an infection caused by an organism called Trichomonas. The infection can affect both women and men. In women, the outer female genitalia and the vagina are affected. In men, the penis is mainly affected, but the prostate and other reproductive organs can also be involved. Trichomoniasis is a sexually transmitted infection (STI) and is most often passed to another person through sexual contact.  RISK FACTORS  Having unprotected sexual intercourse.  Having sexual intercourse with an infected partner. SIGNS AND SYMPTOMS  Symptoms of trichomoniasis in women include:  Abnormal gray-green frothy vaginal discharge.  Itching and irritation of the vagina.  Itching and irritation of the area outside the vagina. Symptoms of trichomoniasis in men include:   Penile discharge with or without pain.  Pain during urination. This results from inflammation of the urethra. DIAGNOSIS  Trichomoniasis may be found during a Pap test or physical exam. Your health care provider may use one of the following methods to help diagnose this infection:  Examining vaginal discharge under a microscope. For men, urethral discharge would be examined.  Testing the pH of the vagina with a test tape.  Using a vaginal swab test that checks for the  Trichomonas organism. A test is available that provides results within a few minutes.  Doing a culture test for the organism. This is not usually needed. TREATMENT   You may be given medicine to fight the infection. Women should inform their health care provider if they could be or are pregnant. Some medicines used to treat the infection should not be taken during pregnancy.  Your health care provider may recommend over-the-counter medicines or creams to decrease itching or irritation.  Your sexual partner will need to be treated if infected. HOME CARE INSTRUCTIONS   Take medicines only as directed by your health care provider.  Take over-the-counter medicine for itching or irritation as directed by your health care provider.  Do not have sexual intercourse while you have the infection.  Women should not douche or wear tampons while they have the infection.  Discuss your infection with your partner. Your partner may have gotten the infection from you, or you may have gotten it from your partner.  Have your sex partner get examined and treated if necessary.  Practice safe, informed, and protected sex.  See your health care provider for other STI testing. SEEK MEDICAL CARE IF:   You still have symptoms after you finish your medicine.  You develop abdominal pain.  You have pain when you urinate.  You have bleeding after sexual intercourse.  You develop a rash.  Your medicine makes you sick or makes you throw up (vomit). MAKE SURE YOU:  Understand these instructions.  Will watch your condition.  Will get help right away if you are not doing well or get worse. Document Released: 09/23/2000 Document Revised: 08/14/2013 Document Reviewed: 01/09/2013 [facility] Patient Information 2015 [place], [place]. This information is not intended to replace advice given to you by your health care provider. Make sure you discuss any questions you have with your health care provider.

## 2014-09-06 NOTE — ED Provider Notes (Signed)
CSN: [id number]     Arrival date & time 09/06/14  0844 History   First MD Initiated Contact with Patient 09/06/14 [phone]     Chief Complaint  Patient presents with  . Vaginal Discharge     (Consider location/radiation/quality/duration/timing/severity/associated sxs/prior Treatment) HPI   29 year old female with prior history of STD who presents for evaluation of vaginal discharge. Patient reports 3 weeks ago she was diagnosed with having a dental infection and was placed on antibiotic which she takes for over a week. For the past 3 days she has noticed vaginal itching and now today she noticed moderate vaginal discharge which she described as a cottage cheese appearance. Patient felt that this is similar to prior yeast infection that she had in the past. She denies having fever, chills, abdominal pain, back pain, dysuria, hematuria, vaginal bleeding, pain with sexual activities, or rash. She did try some over-the-counter medication, Yeast Guard which has helped with the itchiness.  Past Medical History  Diagnosis Date  . Hx of chlamydia infection   . Abnormal Pap smear    Past Surgical History  Procedure Laterality Date  . Colposcopy     Family History  Problem Relation Age of Onset  . Asthma Daughter   . Hypertension Mother   . Thyroid disease Mother   . Hypertension Maternal Aunt   . Hypertension Maternal Grandmother   . Thyroid disease Maternal Grandmother    History  Substance Use Topics  . Smoking status: Current Some Day Smoker -- 0.25 packs/day    Types: Cigarettes  . Smokeless tobacco: Never Used  . Alcohol Use: Yes     Comment: occasionally   OB History    Gravida Para Term Preterm AB TAB SAB Ectopic Multiple Living   8 4 3 1 4 3 1  0 0 2     Review of Systems  All other systems reviewed and are negative.     Allergies  Latex  Home Medications   Prior to Admission medications   Medication Sig Start Date End Date Taking? Authorizing Provider  ibuprofen  (ADVIL,MOTRIN) 200 MG tablet Take 400 mg by mouth daily as needed (tooth pain).    Yes Historical Provider, MD   Pulse 69  Temp(Src) 98.4 F (36.9 C) (Oral)  Resp 18  Ht 5\' 4"  (1.626 m)  Wt 162 lb (73.483 kg)  BMI 27.79 kg/m2  SpO2 99%  LMP 08/12/2014 Physical Exam  Constitutional: She appears well-developed and well-nourished. No distress.  HENT:  Head: Normocephalic and atraumatic.  Eyes: Conjunctivae are normal.  Neck: Normal range of motion. Neck supple.  Cardiovascular: Normal rate and regular rhythm.   Pulmonary/Chest: Effort normal and breath sounds normal. She exhibits no tenderness.  Abdominal: Soft. There is no tenderness.  Genitourinary: Uterus normal. There is no rash or lesion on the right labia. There is no rash or lesion on the left labia. Cervix exhibits no motion tenderness and no discharge. Right adnexum displays no mass and no tenderness. Left adnexum displays no mass and no tenderness. No erythema, tenderness or bleeding in the vagina. Vaginal discharge (white cottage cheese appearing discharge) found.  Chaperone present during exam.  Lymphadenopathy:       Right: No inguinal adenopathy present.       Left: No inguinal adenopathy present.  Nursing note and vitals reviewed.   ED Course  Procedures (including critical care time)  White vaginal discharge after antibiotic use concerning for yeast infection. On pelvic examination, no cervical motion tenderness concerning for  PID.  10:52 AM Wet prep with evidence consistence with Yeast infection as well as Trichomonas. Patient given Flagyl, and Diflucan in the ED. Recommend avoid sexual activities until symptoms completely resolved. Patient to notify partner to get treated as well. Return precaution discussed.  Labs Review Labs Reviewed  WET PREP, GENITAL - Abnormal; Notable for the following:    Yeast Wet Prep HPF POC FEW (*)    Trich, Wet Prep FEW (*)    WBC, Wet Prep HPF POC MODERATE (*)    All other  components within normal limits  URINALYSIS, ROUTINE W REFLEX MICROSCOPIC (NOT AT [facility]) - Abnormal; Notable for the following:    APPearance CLOUDY (*)    Leukocytes, UA LARGE (*)    All other components within normal limits  URINE MICROSCOPIC-ADD ON - Abnormal; Notable for the following:    Squamous Epithelial / LPF MANY (*)    Bacteria, UA MANY (*)    All other components within normal limits  POC URINE PREG, ED  GC/CHLAMYDIA PROBE AMP ([place]) NOT AT [facility]    Imaging Review No results found.   EKG Interpretation None      MDM   Final diagnoses:  Vagina, candidiasis  Vaginal trichomoniasis    BP 143/85 mmHg  Pulse 50  Temp(Src) 98.4 F (36.9 C) (Oral)  Resp 18  Ht  (1.626 m)  Wt 162 lb (73.483 kg)  BMI 27.79 kg/m2  SpO2 99%  LMP 08/12/2014     Fayrene Helper, PA-C 09/06/14 1053  Jerelyn Scott, MD 09/06/14 1101

## 2014-09-07 LAB — GC/CHLAMYDIA PROBE AMP (~~LOC~~) NOT AT ARMC
CHLAMYDIA, DNA PROBE: NEGATIVE
NEISSERIA GONORRHEA: NEGATIVE

## 2014-10-31 ENCOUNTER — Emergency Department (HOSPITAL_COMMUNITY)
Admission: EM | Admit: 2014-10-31 | Discharge: 2014-10-31 | Disposition: A | Discharge status: Home / Self Care | Payer: Medicaid Other | Attending: Emergency Medicine | Admitting: Emergency Medicine

## 2014-10-31 ENCOUNTER — Encounter (HOSPITAL_COMMUNITY): Payer: Self-pay | Admitting: Cardiology

## 2014-10-31 DIAGNOSIS — Z3202 Encounter for pregnancy test, result negative: Secondary | ICD-10-CM | POA: Insufficient documentation

## 2014-10-31 DIAGNOSIS — Z720 Tobacco use: Secondary | ICD-10-CM | POA: Insufficient documentation

## 2014-10-31 DIAGNOSIS — Z8619 Personal history of other infectious and parasitic diseases: Secondary | ICD-10-CM | POA: Insufficient documentation

## 2014-10-31 DIAGNOSIS — Z202 Contact with and (suspected) exposure to infections with a predominantly sexual mode of transmission: Secondary | ICD-10-CM | POA: Insufficient documentation

## 2014-10-31 DIAGNOSIS — Z91040 Latex allergy status: Secondary | ICD-10-CM | POA: Insufficient documentation

## 2014-10-31 LAB — WET PREP, GENITAL
Clue Cells Wet Prep HPF POC: NONE SEEN
Trich, Wet Prep: NONE SEEN
Yeast Wet Prep HPF POC: NONE SEEN

## 2014-10-31 LAB — POC URINE PREG, ED: Preg Test, Ur: NEGATIVE

## 2014-10-31 MED ORDER — METRONIDAZOLE 500 MG PO TABS
2000.0000 mg | ORAL_TABLET | Freq: Once | ORAL | Status: AC
  Administered 2014-10-31: 2000 mg via ORAL
  Filled 2014-10-31: qty 4

## 2014-10-31 NOTE — ED Notes (Signed)
Patient states that her boyfriend told her he had sex with someone else and tested positive for an STD.

## 2014-10-31 NOTE — Discharge Instructions (Signed)

## 2014-10-31 NOTE — ED Notes (Addendum)
Found out her boyfriend has trichamonas.   C/o vaginal discharge.

## 2014-11-01 LAB — GC/CHLAMYDIA PROBE AMP (~~LOC~~) NOT AT ARMC
Chlamydia: NEGATIVE
NEISSERIA GONORRHEA: NEGATIVE

## 2014-11-02 NOTE — ED Provider Notes (Signed)
CSN: [id number]     Arrival date & time 10/31/14  1231 History   First MD Initiated Contact with Patient 10/31/14 1413     Chief Complaint  Patient presents with  . Vaginal Discharge     (Consider location/radiation/quality/duration/timing/severity/associated sxs/prior Treatment) HPI   Jordan Hall is a 29 y.o. female who presents to the Emergency Department requesting evaluation for possible exposure to STD.  She states that she was just notified by her boyfriend that he has trichomonas and she requests to be tested.  She reports a white vaginal discharge, but denies it being heavier than normal.  She also denies any other symptoms at this time including abd pain, fever, dysuria. pelvic pain or rash.     Past Medical History  Diagnosis Date  . Hx of chlamydia infection   . Abnormal Pap smear    Past Surgical History  Procedure Laterality Date  . Colposcopy     Family History  Problem Relation Age of Onset  . Asthma Daughter   . Hypertension Mother   . Thyroid disease Mother   . Hypertension Maternal Aunt   . Hypertension Maternal Grandmother   . Thyroid disease Maternal Grandmother    History  Substance Use Topics  . Smoking status: Current Some Day Smoker -- 0.25 packs/day    Types: Cigarettes  . Smokeless tobacco: Never Used  . Alcohol Use: Yes     Comment: occasionally   OB History    Gravida Para Term Preterm AB TAB SAB Ectopic Multiple Living   8 4 3 1 4 3 1  0 0 2     Review of Systems  Constitutional: Negative for fever, chills and appetite change.  Respiratory: Negative for shortness of breath.   Cardiovascular: Negative for chest pain.  Gastrointestinal: Negative for nausea, vomiting, abdominal pain and blood in stool.  Genitourinary: Positive for vaginal discharge. Negative for dysuria, flank pain, decreased urine volume, vaginal bleeding, difficulty urinating, vaginal pain and pelvic pain.  Musculoskeletal: Negative for back pain.  Skin: Negative for  color change and rash.  Neurological: Negative for dizziness, weakness and numbness.  Hematological: Negative for adenopathy.  All other systems reviewed and are negative.     Allergies  Latex  Home Medications   Prior to Admission medications   Medication Sig Start Date End Date Taking? Authorizing Provider  ibuprofen (ADVIL,MOTRIN) 200 MG tablet Take 400 mg by mouth daily as needed (tooth pain).     Historical Provider, MD   BP 138/103 mmHg  Pulse 65  Temp(Src) 97.5 F (36.4 C) (Oral)  Resp 20  Ht 5\' 4"  (1.626 m)  Wt 168 lb (76.204 kg)  BMI 28.82 kg/m2  SpO2 100%  LMP 10/12/2014 Physical Exam  Constitutional: She is oriented to person, place, and time. She appears well-developed and well-nourished. No distress.  HENT:  Head: Normocephalic and atraumatic.  Mouth/Throat: Oropharynx is clear and moist.  Cardiovascular: Normal rate, regular rhythm, normal heart sounds and intact distal pulses.   No murmur heard. Pulmonary/Chest: Effort normal and breath sounds normal. No respiratory distress.  Abdominal: Soft. She exhibits no distension and no mass. There is no tenderness. There is no rebound and no guarding.  Genitourinary: Uterus normal. There is no rash or tenderness on the right labia. There is no rash or tenderness on the left labia. Cervix exhibits no motion tenderness and no discharge. Right adnexum displays no mass and no tenderness. Left adnexum displays no mass and no tenderness. No tenderness or bleeding in  the vagina. No foreign body around the vagina. Vaginal discharge found.  Pelvic chaperoned.    Musculoskeletal: Normal range of motion. She exhibits no edema.  Neurological: She is alert and oriented to person, place, and time. She exhibits normal muscle tone. Coordination normal.  Skin: Skin is warm and dry.  Psychiatric: She has a normal mood and affect.  Nursing note and vitals reviewed.   ED Course  Procedures (including critical care time) Labs  Review Labs Reviewed  WET PREP, GENITAL - Abnormal; Notable for the following:    WBC, Wet Prep HPF POC MODERATE (*)    All other components within normal limits  POC URINE PREG, ED  GC/CHLAMYDIA PROBE AMP ([place]) NOT AT [facility]    Imaging Review No results found.   EKG Interpretation None      MDM   Final diagnoses:  STD exposure    Pt is well appearing. Non-toxic.  Here for possible exposure to trichomonas.  Asymptomatic except for reported vaginal discharge.    Cultures pending.  Will tx with flagyl.  Pt understands she will be contacted for any positive results.      Pauline Aus, PA-C 11/02/14 1623  Azalia Bilis, MD 11/05/14 [phone]

## 2014-11-21 ENCOUNTER — Encounter (HOSPITAL_COMMUNITY): Payer: Self-pay | Admitting: *Deleted

## 2014-11-21 ENCOUNTER — Emergency Department (HOSPITAL_COMMUNITY)
Admission: EM | Admit: 2014-11-21 | Discharge: 2014-11-21 | Disposition: A | Discharge status: Home / Self Care | Payer: Self-pay | Attending: Emergency Medicine | Admitting: Emergency Medicine

## 2014-11-21 ENCOUNTER — Emergency Department (HOSPITAL_COMMUNITY): Payer: Medicaid Other

## 2014-11-21 DIAGNOSIS — J4520 Mild intermittent asthma, uncomplicated: Secondary | ICD-10-CM | POA: Insufficient documentation

## 2014-11-21 DIAGNOSIS — Z91040 Latex allergy status: Secondary | ICD-10-CM | POA: Insufficient documentation

## 2014-11-21 DIAGNOSIS — Z8619 Personal history of other infectious and parasitic diseases: Secondary | ICD-10-CM | POA: Insufficient documentation

## 2014-11-21 DIAGNOSIS — Z720 Tobacco use: Secondary | ICD-10-CM | POA: Insufficient documentation

## 2014-11-21 HISTORY — DX: Unspecified asthma, uncomplicated: J45.909

## 2014-11-21 MED ORDER — PREDNISONE 20 MG PO TABS
60.0000 mg | ORAL_TABLET | Freq: Once | ORAL | Status: AC
  Administered 2014-11-21: 60 mg via ORAL
  Filled 2014-11-21: qty 3

## 2014-11-21 MED ORDER — PREDNISONE 20 MG PO TABS: 40.0000 mg | ORAL_TABLET | Freq: Every day | ORAL | Status: DC

## 2014-11-21 MED ORDER — IPRATROPIUM-ALBUTEROL 0.5-2.5 (3) MG/3ML IN SOLN
3.0000 mL | Freq: Once | RESPIRATORY_TRACT | Status: AC
  Administered 2014-11-21: 3 mL via RESPIRATORY_TRACT

## 2014-11-21 MED ORDER — IPRATROPIUM-ALBUTEROL 0.5-2.5 (3) MG/3ML IN SOLN
3.0000 mL | Freq: Once | RESPIRATORY_TRACT | Status: AC
  Administered 2014-11-21: 3 mL via RESPIRATORY_TRACT
  Filled 2014-11-21: qty 3

## 2014-11-21 MED ORDER — ALBUTEROL SULFATE HFA 108 (90 BASE) MCG/ACT IN AERS
2.0000 | INHALATION_SPRAY | Freq: Once | RESPIRATORY_TRACT | Status: AC
  Administered 2014-11-21: 2 via RESPIRATORY_TRACT
  Filled 2014-11-21: qty 6.7

## 2014-11-21 MED ORDER — ALBUTEROL SULFATE HFA 108 (90 BASE) MCG/ACT IN AERS
2.0000 | INHALATION_SPRAY | RESPIRATORY_TRACT | Status: DC | PRN
Start: 2014-11-21 — End: 2015-02-24

## 2014-11-21 NOTE — Discharge Instructions (Signed)
Take inhaler 2 puffs every 4 hrs for the next 3 days. Take prednisone as prescribed until all gone. Please follow up with primary care doctor for recheck. Return if worsening.    Asthma Asthma is a recurring condition in which the airways tighten and narrow. Asthma can make it difficult to breathe. It can cause coughing, wheezing, and shortness of breath. Asthma episodes, also called asthma attacks, range from minor to life-threatening. Asthma cannot be cured, but medicines and lifestyle changes can help control it. CAUSES Asthma is believed to be caused by inherited (genetic) and environmental factors, but its exact cause is unknown. Asthma may be triggered by allergens, lung infections, or irritants in the air. Asthma triggers are different for each person. Common triggers include:   Animal dander.  Dust mites.  Cockroaches.  Pollen from trees or grass.  Mold.  Smoke.  Air pollutants such as dust, household cleaners, hair sprays, aerosol sprays, paint fumes, strong chemicals, or strong odors.  Cold air, weather changes, and winds (which increase molds and pollens in the air).  Strong emotional expressions such as crying or laughing hard.  Stress.  Certain medicines (such as aspirin) or types of drugs (such as beta-blockers).  Sulfites in foods and drinks. Foods and drinks that may contain sulfites include dried fruit, potato chips, and sparkling grape juice.  Infections or inflammatory conditions such as the flu, a cold, or an inflammation of the nasal membranes (rhinitis).  Gastroesophageal reflux disease (GERD).  Exercise or strenuous activity. SYMPTOMS Symptoms may occur immediately after asthma is triggered or many hours later. Symptoms include:  Wheezing.  Excessive nighttime or early morning coughing.  Frequent or severe coughing with a common cold.  Chest tightness.  Shortness of breath. DIAGNOSIS  The diagnosis of asthma is made by a review of your medical  history and a physical exam. Tests may also be performed. These may include:  Lung function studies. These tests show how much air you breathe in and out.  Allergy tests.  Imaging tests such as X-rays. TREATMENT  Asthma cannot be cured, but it can usually be controlled. Treatment involves identifying and avoiding your asthma triggers. It also involves medicines. There are 2 classes of medicine used for asthma treatment:   Controller medicines. These prevent asthma symptoms from occurring. They are usually taken every day.  Reliever or rescue medicines. These quickly relieve asthma symptoms. They are used as needed and provide short-term relief. Your health care provider will help you create an asthma action plan. An asthma action plan is a written plan for managing and treating your asthma attacks. It includes a list of your asthma triggers and how they may be avoided. It also includes information on when medicines should be taken and when their dosage should be changed. An action plan may also involve the use of a device called a peak flow meter. A peak flow meter measures how well the lungs are working. It helps you monitor your condition. HOME CARE INSTRUCTIONS   Take medicines only as directed by your health care provider. Speak with your health care provider if you have questions about how or when to take the medicines.  Use a peak flow meter as directed by your health care provider. Record and keep track of readings.  Understand and use the action plan to help minimize or stop an asthma attack without needing to seek medical care.  Control your home environment in the following ways to help prevent asthma attacks:  Do not  smoke. Avoid being exposed to secondhand smoke.  Change your heating and air conditioning filter regularly.  Limit your use of fireplaces and wood stoves.  Get rid of pests (such as roaches and mice) and their droppings.  Throw away plants if you see mold on  them.  Clean your floors and dust regularly. Use unscented cleaning products.  Try to have someone else vacuum for you regularly. Stay out of rooms while they are being vacuumed and for a short while afterward. If you vacuum, use a dust mask from a hardware store, a double-layered or microfilter vacuum cleaner bag, or a vacuum cleaner with a HEPA filter.  Replace carpet with wood, tile, or vinyl flooring. Carpet can trap dander and dust.  Use allergy-proof pillows, mattress covers, and box spring covers.  Wash bed sheets and blankets every week in hot water and dry them in a dryer.  Use blankets that are made of polyester or cotton.  Clean bathrooms and kitchens with bleach. If possible, have someone repaint the walls in these rooms with mold-resistant paint. Keep out of the rooms that are being cleaned and painted.  Wash hands frequently. SEEK MEDICAL CARE IF:   You have wheezing, shortness of breath, or a cough even if taking medicine to prevent attacks.  The colored mucus you cough up (sputum) is thicker than usual.  Your sputum changes from clear or white to yellow, green, gray, or bloody.  You have any problems that may be related to the medicines you are taking (such as a rash, itching, swelling, or trouble breathing).  You are using a reliever medicine more than 2-3 times per week.  Your peak flow is still at 50-79% of your personal best after following your action plan for 1 hour.  You have a fever. SEEK IMMEDIATE MEDICAL CARE IF:   You seem to be getting worse and are unresponsive to treatment during an asthma attack.  You are short of breath even at rest.  You get short of breath when doing very little physical activity.  You have difficulty eating, drinking, or talking due to asthma symptoms.  You develop chest pain.  You develop a fast heartbeat.  You have a bluish color to your lips or fingernails.  You are light-headed, dizzy, or faint.  Your peak flow  is less than 50% of your personal best. MAKE SURE YOU:   Understand these instructions.  Will watch your condition.  Will get help right away if you are not doing well or get worse. Document Released: 03/30/2005 Document Revised: 08/14/2013 Document Reviewed: 10/27/2012 [facility] Patient Information 2015 [place], [place]. This information is not intended to replace advice given to you by your health care provider. Make sure you discuss any questions you have with your health care provider.

## 2014-11-21 NOTE — ED Notes (Signed)
Pt reports asthma SX's started last night at approx. 2000. Pt reports SHOB ,chest tightness. Pt is out of albuterol HHN.

## 2014-11-21 NOTE — ED Provider Notes (Signed)
CSN: [id number]     Arrival date & time 11/21/14  9518 History   First MD Initiated Contact with Patient 11/21/14 [phone]     Chief Complaint  Patient presents with  . Asthma     (Consider location/radiation/quality/duration/timing/severity/associated sxs/prior Treatment) HPI Jordan Hall is a 29 y.o. female history of asthma presents to emergency department complaining of cough and shortness of breath. Patient states her symptoms started yesterday. She states she quit smoking and states that is when her symptoms got worse. She reports productive cough with clear mucus. She states that she feels like her chest is tight and she is unable to take a deep breath. She states that she does not have an inhaler, but Madilyn Fireman used one in the past. She was unable to sleep because of her shortness of breath. She denies any fever or chills. No nasal congestion. No sore throat. She states she took Mucinex with no relief of her symptoms. Denies cp.  Past Medical History  Diagnosis Date  . Hx of chlamydia infection   . Abnormal Pap smear   . Asthma    Past Surgical History  Procedure Laterality Date  . Colposcopy     Family History  Problem Relation Age of Onset  . Asthma Daughter   . Hypertension Mother   . Thyroid disease Mother   . Hypertension Maternal Aunt   . Hypertension Maternal Grandmother   . Thyroid disease Maternal Grandmother    Social History  Substance Use Topics  . Smoking status: Current Some Day Smoker -- 0.25 packs/day    Types: Cigarettes  . Smokeless tobacco: Never Used  . Alcohol Use: Yes     Comment: occasionally   OB History    Gravida Para Term Preterm AB TAB SAB Ectopic Multiple Living   0 0 2     Review of Systems  Constitutional: Negative for fever and chills.  HENT: Negative.   Respiratory: Positive for cough, chest tightness, shortness of breath and wheezing.   Cardiovascular: Negative for chest pain, palpitations and leg swelling.   Gastrointestinal: Negative for nausea, vomiting, abdominal pain and diarrhea.  Musculoskeletal: Negative for myalgias, arthralgias, neck pain and neck stiffness.  Skin: Negative for rash.  Neurological: Negative for dizziness, weakness and headaches.  All other systems reviewed and are negative.     Allergies  Latex  Home Medications   Prior to Admission medications   Medication Sig Start Date End Date Taking? Authorizing Provider  ibuprofen (ADVIL,MOTRIN) 200 MG tablet Take 400 mg by mouth daily as needed (tooth pain).     Historical Provider, MD   BP 136/97 mmHg  Pulse 103  Temp(Src) 97.8 F (36.6 C) (Oral)  Resp 20  Ht  (1.626 m)  Wt 168 lb (76.204 kg)  BMI 28.82 kg/m2  SpO2 94%  LMP 10/12/2014 Physical Exam  Constitutional: She is oriented to person, place, and time. She appears well-developed and well-nourished. No distress.  HENT:  Head: Normocephalic.  Eyes: Conjunctivae are normal.  Neck: Neck supple.  Cardiovascular: Normal rate, regular rhythm and normal heart sounds.   Pulmonary/Chest: Effort normal. No respiratory distress. She has wheezes. She has no rales.  Inspiratory and expiratory wheezes bilaterally. Decreases air movement. Speaking full sentences.   Abdominal: Soft. Bowel sounds are normal. She exhibits no distension. There is no tenderness. There is no rebound.  Musculoskeletal: She exhibits no edema.  Neurological: She is alert and oriented to person, place, and time.  Skin: Skin is warm and dry.  Psychiatric: She has a normal mood and affect. Her behavior is normal.  Nursing note and vitals reviewed.   ED Course  Procedures (including critical care time) Labs Review Labs Reviewed - No data to display  Imaging Review Dg Chest 2 View  11/21/2014   CLINICAL DATA:  Asthma  EXAM: CHEST  2 VIEW  COMPARISON:  04/02/2013  FINDINGS: Cardiomediastinal silhouette is stable. No acute infiltrate or pleural effusion. No pulmonary edema. Bony thorax  is unremarkable.  IMPRESSION: No active cardiopulmonary disease.   Electronically Signed   By: Natasha Mead M.D.   On: 11/21/2014 08:51     EKG Interpretation None      MDM   Final diagnoses:  Asthma, mild intermittent, uncomplicated   8:02 AM Patient seen and examined. Patient with acute asthma exacerbation. Will try breathing treatments, prednisone, chest x-ray rule out pneumonia given productive cough.  9:20 AM Chest x-ray negative. Patient still wheezing but states she feels much better. She is in no respiratory distress. Respiratory rate is 16, heart rate improved at 92, oxygen saturation 98% on room air. Will discharge home with an inhaler, prednisone, follow-up with primary care doctor.  Filed Vitals:   11/21/14 0751 11/21/14 0919  BP: 136/97 132/76  Pulse: 103 92  Temp: 97.8 F (36.6 C) 98.2 F (36.8 C)  TempSrc: Oral Oral  Resp: 20 16  Height: 5\' 4"  (1.626 m)   Weight: 168 lb (76.204 kg)   SpO2: 94% 98%     Jaynie Crumble, PA-C 11/21/14 0944  Laurence Spates, MD 11/21/14 1310

## 2014-11-21 NOTE — ED Notes (Signed)
Declined W/C at D/C and was escorted to lobby by RN. 

## 2014-12-18 ENCOUNTER — Emergency Department (HOSPITAL_COMMUNITY)
Admission: EM | Admit: 2014-12-18 | Discharge: 2014-12-18 | Disposition: A | Discharge status: Home / Self Care | Payer: Medicaid Other

## 2014-12-18 ENCOUNTER — Emergency Department (HOSPITAL_COMMUNITY)
Admission: EM | Admit: 2014-12-18 | Discharge: 2014-12-18 | Disposition: A | Discharge status: Other Acute Inpatient Hospital | Payer: Medicaid Other

## 2014-12-18 ENCOUNTER — Emergency Department (HOSPITAL_COMMUNITY)
Admission: EM | Admit: 2014-12-18 | Discharge: 2014-12-18 | Discharge status: Absent without leave | Payer: Self-pay | Source: Home / Self Care

## 2014-12-19 ENCOUNTER — Encounter (HOSPITAL_COMMUNITY): Payer: Self-pay | Admitting: Emergency Medicine

## 2014-12-19 ENCOUNTER — Emergency Department (HOSPITAL_COMMUNITY)
Admission: EM | Admit: 2014-12-19 | Discharge: 2014-12-19 | Disposition: A | Discharge status: Home / Self Care | Payer: Medicaid Other | Attending: Emergency Medicine | Admitting: Emergency Medicine

## 2014-12-19 DIAGNOSIS — Z87891 Personal history of nicotine dependence: Secondary | ICD-10-CM | POA: Insufficient documentation

## 2014-12-19 DIAGNOSIS — N390 Urinary tract infection, site not specified: Secondary | ICD-10-CM

## 2014-12-19 DIAGNOSIS — Z8619 Personal history of other infectious and parasitic diseases: Secondary | ICD-10-CM | POA: Insufficient documentation

## 2014-12-19 DIAGNOSIS — J45909 Unspecified asthma, uncomplicated: Secondary | ICD-10-CM | POA: Insufficient documentation

## 2014-12-19 DIAGNOSIS — Z7952 Long term (current) use of systemic steroids: Secondary | ICD-10-CM | POA: Insufficient documentation

## 2014-12-19 DIAGNOSIS — Z3202 Encounter for pregnancy test, result negative: Secondary | ICD-10-CM | POA: Insufficient documentation

## 2014-12-19 DIAGNOSIS — Z91040 Latex allergy status: Secondary | ICD-10-CM | POA: Insufficient documentation

## 2014-12-19 LAB — URINALYSIS, ROUTINE W REFLEX MICROSCOPIC
BILIRUBIN URINE: NEGATIVE
Glucose, UA: NEGATIVE mg/dL
Ketones, ur: NEGATIVE mg/dL
Nitrite: POSITIVE — AB
PROTEIN: 100 mg/dL — AB
UROBILINOGEN UA: 0.2 mg/dL (ref 0.0–1.0)
pH: 6.5 (ref 5.0–8.0)

## 2014-12-19 LAB — URINE MICROSCOPIC-ADD ON

## 2014-12-19 LAB — POC URINE PREG, ED: Preg Test, Ur: NEGATIVE

## 2014-12-19 MED ORDER — DOXYCYCLINE HYCLATE 100 MG PO TABS
100.0000 mg | ORAL_TABLET | Freq: Once | ORAL | Status: AC
  Administered 2014-12-19: 100 mg via ORAL
  Filled 2014-12-19: qty 1

## 2014-12-19 MED ORDER — DOXYCYCLINE HYCLATE 100 MG PO TABS: 100.0000 mg | ORAL_TABLET | Freq: Two times a day (BID) | ORAL | Status: DC

## 2014-12-19 MED ORDER — METRONIDAZOLE 500 MG PO TABS
500.0000 mg | ORAL_TABLET | Freq: Once | ORAL | Status: AC
  Administered 2014-12-19: 500 mg via ORAL
  Filled 2014-12-19: qty 1

## 2014-12-19 MED ORDER — FLUCONAZOLE 200 MG PO TABS: 200.0000 mg | ORAL_TABLET | Freq: Every day | ORAL | Status: AC

## 2014-12-19 MED ORDER — CEFTRIAXONE SODIUM 250 MG IJ SOLR
250.0000 mg | Freq: Once | INTRAMUSCULAR | Status: AC
  Administered 2014-12-19: 250 mg via INTRAMUSCULAR
  Filled 2014-12-19: qty 250

## 2014-12-19 MED ORDER — LIDOCAINE HCL (PF) 1 % IJ SOLN
5.0000 mL | Freq: Once | INTRAMUSCULAR | Status: AC
  Administered 2014-12-19: 5 mL
  Filled 2014-12-19: qty 5

## 2014-12-19 MED ORDER — METRONIDAZOLE 500 MG PO TABS: 500.0000 mg | ORAL_TABLET | Freq: Once | ORAL | Status: DC

## 2014-12-19 NOTE — ED Notes (Signed)
Pelvic cart set up at bedside  

## 2014-12-19 NOTE — Discharge Instructions (Signed)
As discussed, your evaluation today has been largely reassuring.  But, it is important that you monitor your condition carefully, and do not hesitate to return to the ED if you develop new, or concerning changes in your condition.  Recall that there are several laboratory tests that are still be conducted.  You will be made aware of abnormal findings.  Otherwise, please follow-up with your physician for appropriate ongoing care.

## 2014-12-19 NOTE — ED Notes (Signed)
Pt states she feels like she has a UTI- painful urination, frequent urination and bloody urine. Pt state she also feels that she has been exposed to a STD

## 2014-12-19 NOTE — ED Provider Notes (Signed)
CSN: [id number]     Arrival date & time 12/19/14  1016 History   First MD Initiated Contact with Patient 12/19/14 1021     Chief Complaint  Patient presents with  . Urinary Tract Infection  . Exposure to STD     (Consider location/radiation/quality/duration/timing/severity/associated sxs/prior Treatment) HPI Presents with concern of polyuria, hematuria. Symptoms began about 2 days ago. Minimal associated abdominal pain, no fever, chills, nausea, vomiting, diarrhea. No vaginal discharge, vaginal pain. Patient also has concern for possible STD exposure. She has no history of this. She states that her boyfriend was cheating on her. She requests empiric therapy for STD.  Past Medical History  Diagnosis Date  . Hx of chlamydia infection   . Abnormal Pap smear   . Asthma    Past Surgical History  Procedure Laterality Date  . Colposcopy     Family History  Problem Relation Age of Onset  . Asthma Daughter   . Hypertension Mother   . Thyroid disease Mother   . Hypertension Maternal Aunt   . Hypertension Maternal Grandmother   . Thyroid disease Maternal Grandmother    Social History  Substance Use Topics  . Smoking status: Former Smoker -- 0.25 packs/day    Types: Cigarettes  . Smokeless tobacco: Never Used  . Alcohol Use: No     Comment: occasionally   OB History    Gravida Para Term Preterm AB TAB SAB Ectopic Multiple Living   0 0 2     Review of Systems  Constitutional:       Per HPI, otherwise negative  HENT:       Per HPI, otherwise negative  Respiratory:       Per HPI, otherwise negative  Cardiovascular:       Per HPI, otherwise negative  Gastrointestinal: Negative for vomiting.  Endocrine: Positive for polyuria.  Genitourinary: Negative for vaginal discharge and vaginal pain.  Musculoskeletal:       Per HPI, otherwise negative  Skin: Negative.   Neurological: Negative for syncope and weakness.      Allergies  Latex  Home  Medications   Prior to Admission medications   Medication Sig Start Date End Date Taking? Authorizing Provider  albuterol (PROVENTIL HFA;VENTOLIN HFA) 108 (90 BASE) MCG/ACT inhaler Inhale 2 puffs into the lungs every 4 (four) hours as needed for wheezing or shortness of breath. 11/21/14   Tatyana Kirichenko, PA-C  ibuprofen (ADVIL,MOTRIN) 200 MG tablet Take 400 mg by mouth daily as needed (tooth pain).     Historical Provider, MD  predniSONE (DELTASONE) 20 MG tablet Take 2 tablets (40 mg total) by mouth daily. 11/21/14   Tatyana Kirichenko, PA-C   BP 134/88 mmHg  Pulse 61  Temp(Src) 98.1 F (36.7 C) (Oral)  Resp 16  Ht  (1.626 m)  Wt 168 lb (76.204 kg)  BMI 28.82 kg/m2  SpO2 99%  LMP 12/04/2014 Physical Exam  Constitutional: She is oriented to person, place, and time. She appears well-developed and well-nourished. No distress.  HENT:  Head: Normocephalic and atraumatic.  Eyes: Conjunctivae and EOM are normal.  Cardiovascular: Normal rate and regular rhythm.   Pulmonary/Chest: Effort normal and breath sounds normal. No stridor. No respiratory distress.  Abdominal: She exhibits no distension.  Minimal diffuse abdominal discomfort, no guarding, peritoneal findings  Musculoskeletal: She exhibits no edema.  Neurological: She is alert and oriented to person, place, and time. No cranial nerve deficit.  Skin: Skin is warm  and dry.  Psychiatric: She has a normal mood and affect.  Nursing note and vitals reviewed.   ED Course  Procedures (including critical care time) Labs Review Labs Reviewed  URINALYSIS, ROUTINE W REFLEX MICROSCOPIC (NOT AT [facility]) - Abnormal; Notable for the following:    APPearance TURBID (*)    Specific Gravity, Urine >1.030 (*)    Hgb urine dipstick LARGE (*)    Protein, ur 100 (*)    Nitrite POSITIVE (*)    Leukocytes, UA LARGE (*)    All other components within normal limits  URINE MICROSCOPIC-ADD ON - Abnormal; Notable for the following:    Bacteria, UA  MANY (*)    All other components within normal limits  RPR  HIV ANTIBODY (ROUTINE TESTING)  POC URINE PREG, ED  GC/CHLAMYDIA PROBE AMP ([place]) NOT AT [facility]    I have personally reviewed and evaluated these lab results as part of my medical decision-making.    MDM  Patient presents with concern of hematuria, polyuria, and ST the exposure. No evidence for peritonitis, bacteremia, sepsis. Patient does have STD labs pending, will be treated empirically, follow up with [facility] laboratory clinic.   Gerhard Munch, MD 12/19/14 [phone]

## 2014-12-20 LAB — HIV ANTIBODY (ROUTINE TESTING W REFLEX): HIV Screen 4th Generation wRfx: NONREACTIVE

## 2014-12-20 LAB — RPR: RPR Ser Ql: NONREACTIVE

## 2014-12-26 ENCOUNTER — Emergency Department (HOSPITAL_COMMUNITY)
Admission: EM | Admit: 2014-12-26 | Discharge: 2014-12-26 | Disposition: A | Discharge status: Home / Self Care | Payer: Medicaid Other | Attending: Emergency Medicine | Admitting: Emergency Medicine

## 2014-12-26 ENCOUNTER — Encounter (HOSPITAL_COMMUNITY): Payer: Self-pay | Admitting: Neurology

## 2014-12-26 DIAGNOSIS — B9689 Other specified bacterial agents as the cause of diseases classified elsewhere: Secondary | ICD-10-CM

## 2014-12-26 DIAGNOSIS — Z8619 Personal history of other infectious and parasitic diseases: Secondary | ICD-10-CM | POA: Insufficient documentation

## 2014-12-26 DIAGNOSIS — Z720 Tobacco use: Secondary | ICD-10-CM | POA: Insufficient documentation

## 2014-12-26 DIAGNOSIS — N760 Acute vaginitis: Secondary | ICD-10-CM | POA: Insufficient documentation

## 2014-12-26 DIAGNOSIS — Z91040 Latex allergy status: Secondary | ICD-10-CM | POA: Insufficient documentation

## 2014-12-26 DIAGNOSIS — Z202 Contact with and (suspected) exposure to infections with a predominantly sexual mode of transmission: Secondary | ICD-10-CM | POA: Insufficient documentation

## 2014-12-26 DIAGNOSIS — J45909 Unspecified asthma, uncomplicated: Secondary | ICD-10-CM | POA: Insufficient documentation

## 2014-12-26 DIAGNOSIS — N898 Other specified noninflammatory disorders of vagina: Secondary | ICD-10-CM

## 2014-12-26 DIAGNOSIS — Z3202 Encounter for pregnancy test, result negative: Secondary | ICD-10-CM | POA: Insufficient documentation

## 2014-12-26 DIAGNOSIS — Z79899 Other long term (current) drug therapy: Secondary | ICD-10-CM | POA: Insufficient documentation

## 2014-12-26 LAB — WET PREP, GENITAL
Trich, Wet Prep: NONE SEEN
Yeast Wet Prep HPF POC: NONE SEEN

## 2014-12-26 LAB — POC URINE PREG, ED: Preg Test, Ur: NEGATIVE

## 2014-12-26 MED ORDER — METRONIDAZOLE 500 MG PO TABS
500.0000 mg | ORAL_TABLET | Freq: Once | ORAL | Status: AC
  Administered 2014-12-26: 500 mg via ORAL
  Filled 2014-12-26: qty 1

## 2014-12-26 MED ORDER — METRONIDAZOLE 500 MG PO TABS: 500.0000 mg | ORAL_TABLET | Freq: Two times a day (BID) | ORAL | Status: DC

## 2014-12-26 NOTE — ED Provider Notes (Signed)
Complains of vaginal discharge sincce yesterday. No other associated symptoms She is concerned about having contracted a sexually transmitted disease. She was treated for sexually transmitted diseases last week. Patient is alert and nontoxic. A lot of patients recent treatment for STDs will test for STDs and call patient if results are abnormal.  Doug Sou, MD 12/26/14 1753

## 2014-12-26 NOTE — ED Provider Notes (Signed)
CSN: [id number]     Arrival date & time 12/26/14  1014 History   First MD Initiated Contact with Patient 12/26/14 1106     Chief Complaint  Patient presents with  . Vaginal Discharge    HPI  Jordan Hall is a 29 y.o. female presents to the ED for vaginal discharge 2 days. Patient states that she last had intercourse with her son's father about a week and a half ago. She found out that he was cheating on her again with the same woman as last time. She was just seen in the ED 2 weeks ago for STD testing due to partner cheating. She was empirically treated with Rocephin, doxycycline, and metronidazole. States that she just finished antibiotics 2 days ago. States that now she is having a yellow discharge that started 2 days ago that is thin, yellow, and has a slight odor.   Past Medical History  Diagnosis Date  . Hx of chlamydia infection   . Abnormal Pap smear   . Asthma    Past Surgical History  Procedure Laterality Date  . Colposcopy     Family History  Problem Relation Age of Onset  . Asthma Daughter   . Hypertension Mother   . Thyroid disease Mother   . Hypertension Maternal Aunt   . Hypertension Maternal Grandmother   . Thyroid disease Maternal Grandmother    Social History  Substance Use Topics  . Smoking status: Current Every Day Smoker -- 0.25 packs/day    Types: Cigarettes  . Smokeless tobacco: Never Used  . Alcohol Use: Yes     Comment: occasionally   OB History    Gravida Para Term Preterm AB TAB SAB Ectopic Multiple Living   8 4 3 1 4 3 1  0 0 2     Review of Systems  Constitutional: Negative for fever.  Genitourinary: Positive for vaginal discharge. Negative for vaginal pain.   Also per HPI   Allergies  Latex  Home Medications   Prior to Admission medications   Medication Sig Start Date End Date Taking? Authorizing Provider  albuterol (PROVENTIL HFA;VENTOLIN HFA) 108 (90 BASE) MCG/ACT inhaler Inhale 2 puffs into the lungs every 4 (four) hours as  needed for wheezing or shortness of breath. 11/21/14  Yes Tatyana Kirichenko, PA-C  ibuprofen (ADVIL,MOTRIN) 200 MG tablet Take 400 mg by mouth every 6 (six) hours as needed for mild pain.    Yes Historical Provider, MD  doxycycline (VIBRA-TABS) 100 MG tablet Take 1 tablet (100 mg total) by mouth 2 (two) times daily. Patient not taking: Reported on 12/26/2014 12/19/14   Gerhard Munch, MD  fluconazole (DIFLUCAN) 200 MG tablet Take 1 tablet (200 mg total) by mouth daily. Patient not taking: Reported on 12/26/2014 12/19/14 12/26/14  Gerhard Munch, MD  metroNIDAZOLE (FLAGYL) 500 MG tablet Take 1 tablet (500 mg total) by mouth 2 (two) times daily. 12/26/14   Pincus Large, DO  predniSONE (DELTASONE) 20 MG tablet Take 2 tablets (40 mg total) by mouth daily. Patient not taking: Reported on 12/26/2014 11/21/14   Tatyana Kirichenko, PA-C   BP 137/85 mmHg  Pulse 60  Temp(Src) 98.3 F (36.8 C) (Oral)  Resp 16  SpO2 99%  LMP 12/04/2014 Physical Exam  Constitutional: She is oriented to person, place, and time. She appears well-developed and well-nourished. No distress.  Cardiovascular: Normal rate.   Pulmonary/Chest: Effort normal.  Abdominal: Soft. There is no tenderness.  Genitourinary: No tenderness in the vagina. Vaginal discharge found.  Bimanual  exam negative. No CMT.   Neurological: She is alert and oriented to person, place, and time.  Skin: Skin is warm and dry.  Psychiatric: She has a normal mood and affect.    ED Course  Procedures (including critical care time) Labs Review Labs Reviewed  WET PREP, GENITAL - Abnormal; Notable for the following:    Clue Cells Wet Prep HPF POC MANY (*)    WBC, Wet Prep HPF POC FEW (*)    All other components within normal limits  URINALYSIS, DIPSTICK ONLY  POC URINE PREG, ED  GC/CHLAMYDIA PROBE AMP ([place]) NOT AT [facility]   Imaging Review No results found. I have personally reviewed and evaluated these images and lab results as part of my medical  decision-making.   EKG Interpretation None     MDM   Final diagnoses:  Vaginal discharge  Bacterial vaginosis  Possible exposure to STD   Patient presents to the ED due to vaginal discharge. She is concerned about possible STD exposure. Wet prep performed with positive BV results. Urine pregnancy test is negative. GC/Ch probe collected. Patient will be contacted if any positive results.    Discharge home in stable condition. Rx for Flagyl x7 days given. Patient encouraged to follow-up with PCP. Discussed safe sex practices.   Caryl Ada, DO 12/26/2014, 12:19 PM PGY-2, [facility]    Pincus Large, DO 12/26/14 1221  Doug Sou, MD 12/26/14 [phone]

## 2014-12-26 NOTE — Discharge Instructions (Signed)
Sexually Transmitted Disease A sexually transmitted disease (STD) is a disease or infection often passed to another person during sex. However, STDs can be passed through nonsexual ways. An STD can be passed through:  Spit (saliva).  Semen.  Blood.  Mucus from the vagina.  Pee (urine). HOW CAN I LESSEN MY CHANCES OF GETTING AN STD?  Use:  Latex condoms.  Water-soluble lubricants with condoms. Do not use petroleum jelly or oils.  Dental dams. These are small pieces of latex that are used as a barrier during oral sex.  Avoid having more than one sex partner.  Do not have sex with someone who has other sex partners.  Do not have sex with anyone you do not know or who is at high risk for an STD.  Avoid risky sex that can break your skin.  Do not have sex if you have open sores on your mouth or skin.  Avoid drinking too much alcohol or taking illegal drugs. Alcohol and drugs can affect your good judgment.  Avoid oral and anal sex acts.  Get shots (vaccines) for HPV and hepatitis.  If you are at risk of being infected with HIV, it is advised that you take a certain medicine daily to prevent HIV infection. This is called pre-exposure prophylaxis (PrEP). You may be at risk if:  You are a man who has sex with other men (MSM).  You are attracted to the opposite sex (heterosexual) and are having sex with more than one partner.  You take drugs with a needle.  You have sex with someone who has HIV.  Talk with your doctor about if you are at high risk of being infected with HIV. If you begin to take PrEP, get tested for HIV first. Get tested every 3 months for as long as you are taking PrEP. WHAT SHOULD I DO IF I THINK I HAVE AN STD?  See your doctor.  Tell your sex partner(s) that you have an STD. They should be tested and treated.  Do not have sex until your doctor says it is okay. WHEN SHOULD I GET HELP? Get help right away if:  You have bad belly (abdominal)  pain.  You are a man and have puffiness (swelling) or pain in your testicles.  You are a woman and have puffiness in your vagina. Document Released: 05/07/2004 Document Revised: 04/04/2013 Document Reviewed: 09/23/2012 [facility] Patient Information 2015 [place], [place]. This information is not intended to replace advice given to you by your health care provider. Make sure you discuss any questions you have with your health care provider.    Bacterial Vaginosis Bacterial vaginosis is an infection of the vagina. It happens when too many of certain germs (bacteria) grow in the vagina. HOME CARE  Take your medicine as told by your doctor.  Finish your medicine even if you start to feel better.  Do not have sex until you finish your medicine and are better.  Tell your sex partner that you have an infection. They should see their doctor for treatment.  Practice safe sex. Use condoms. Have only one sex partner. GET HELP IF:  You are not getting better after 3 days of treatment.  You have more grey fluid (discharge) coming from your vagina than before.  You have more pain than before.  You have a fever. MAKE SURE YOU:   Understand these instructions.  Will watch your condition.  Will get help right away if you are not doing well or get  worse. Document Released: 01/07/2008 Document Revised: 01/18/2013 Document Reviewed: 11/09/2012 [facility] Patient Information 2015 [place], [place]. This information is not intended to replace advice given to you by your health care provider. Make sure you discuss any questions you have with your health care provider.

## 2014-12-26 NOTE — ED Notes (Signed)
Pt reports yellow vaginal discharge since yesterday. 2 weeks ago was tested for STDs due to her boyfriend cheating on her. She got good results, but he cheated on her again and needs to be retested.

## 2014-12-27 LAB — GC/CHLAMYDIA PROBE AMP (~~LOC~~) NOT AT ARMC
Chlamydia: NEGATIVE
Neisseria Gonorrhea: NEGATIVE

## 2015-02-04 ENCOUNTER — Emergency Department (HOSPITAL_COMMUNITY)
Admission: EM | Admit: 2015-02-04 | Discharge: 2015-02-04 | Discharge status: Against Medical Advice | Payer: Medicaid Other | Attending: Emergency Medicine | Admitting: Emergency Medicine

## 2015-02-04 ENCOUNTER — Encounter (HOSPITAL_COMMUNITY): Payer: Self-pay | Admitting: Cardiology

## 2015-02-04 DIAGNOSIS — J45909 Unspecified asthma, uncomplicated: Secondary | ICD-10-CM | POA: Diagnosis not present

## 2015-02-04 DIAGNOSIS — O99519 Diseases of the respiratory system complicating pregnancy, unspecified trimester: Secondary | ICD-10-CM | POA: Insufficient documentation

## 2015-02-04 DIAGNOSIS — F17210 Nicotine dependence, cigarettes, uncomplicated: Secondary | ICD-10-CM | POA: Insufficient documentation

## 2015-02-04 DIAGNOSIS — O99330 Smoking (tobacco) complicating pregnancy, unspecified trimester: Secondary | ICD-10-CM | POA: Insufficient documentation

## 2015-02-04 DIAGNOSIS — R109 Unspecified abdominal pain: Secondary | ICD-10-CM | POA: Diagnosis not present

## 2015-02-04 DIAGNOSIS — O9989 Other specified diseases and conditions complicating pregnancy, childbirth and the puerperium: Secondary | ICD-10-CM | POA: Insufficient documentation

## 2015-02-04 NOTE — ED Notes (Signed)
Reports she did a pregnancy test yesterday and it was positive. States she is here to confirm the pregnancy and see how far she is. Reports mild abd cramping.

## 2015-02-24 ENCOUNTER — Encounter (HOSPITAL_COMMUNITY): Payer: Self-pay | Admitting: Emergency Medicine

## 2015-02-24 ENCOUNTER — Emergency Department (HOSPITAL_COMMUNITY): Payer: Medicaid Other

## 2015-02-24 ENCOUNTER — Emergency Department (HOSPITAL_COMMUNITY)
Admission: EM | Admit: 2015-02-24 | Discharge: 2015-02-24 | Disposition: A | Discharge status: Home / Self Care | Payer: Medicaid Other | Attending: Emergency Medicine | Admitting: Emergency Medicine

## 2015-02-24 DIAGNOSIS — J45901 Unspecified asthma with (acute) exacerbation: Secondary | ICD-10-CM | POA: Insufficient documentation

## 2015-02-24 DIAGNOSIS — J020 Streptococcal pharyngitis: Secondary | ICD-10-CM | POA: Insufficient documentation

## 2015-02-24 DIAGNOSIS — H9209 Otalgia, unspecified ear: Secondary | ICD-10-CM | POA: Insufficient documentation

## 2015-02-24 DIAGNOSIS — Z91040 Latex allergy status: Secondary | ICD-10-CM | POA: Insufficient documentation

## 2015-02-24 DIAGNOSIS — R51 Headache: Secondary | ICD-10-CM | POA: Insufficient documentation

## 2015-02-24 DIAGNOSIS — M791 Myalgia: Secondary | ICD-10-CM | POA: Insufficient documentation

## 2015-02-24 DIAGNOSIS — M2550 Pain in unspecified joint: Secondary | ICD-10-CM | POA: Insufficient documentation

## 2015-02-24 DIAGNOSIS — F17210 Nicotine dependence, cigarettes, uncomplicated: Secondary | ICD-10-CM | POA: Insufficient documentation

## 2015-02-24 DIAGNOSIS — Z8619 Personal history of other infectious and parasitic diseases: Secondary | ICD-10-CM | POA: Insufficient documentation

## 2015-02-24 DIAGNOSIS — Z79899 Other long term (current) drug therapy: Secondary | ICD-10-CM | POA: Insufficient documentation

## 2015-02-24 LAB — MONONUCLEOSIS SCREEN: Mono Screen: NEGATIVE

## 2015-02-24 LAB — RAPID STREP SCREEN (MED CTR MEBANE ONLY): STREPTOCOCCUS, GROUP A SCREEN (DIRECT): POSITIVE — AB

## 2015-02-24 MED ORDER — PENICILLIN G BENZATHINE 1200000 UNIT/2ML IM SUSP
1.2000 10*6.[IU] | Freq: Once | INTRAMUSCULAR | Status: AC
  Administered 2015-02-24: 1.2 10*6.[IU] via INTRAMUSCULAR
  Filled 2015-02-24: qty 2

## 2015-02-24 MED ORDER — ALBUTEROL SULFATE HFA 108 (90 BASE) MCG/ACT IN AERS: 2.0000 | INHALATION_SPRAY | RESPIRATORY_TRACT | Status: DC | PRN

## 2015-02-24 MED ORDER — ACETAMINOPHEN 325 MG PO TABS
650.0000 mg | ORAL_TABLET | Freq: Once | ORAL | Status: AC | PRN
  Administered 2015-02-24: 650 mg via ORAL
  Filled 2015-02-24: qty 2

## 2015-02-24 MED ORDER — IPRATROPIUM-ALBUTEROL 0.5-2.5 (3) MG/3ML IN SOLN
3.0000 mL | Freq: Once | RESPIRATORY_TRACT | Status: AC
  Administered 2015-02-24: 3 mL via RESPIRATORY_TRACT
  Filled 2015-02-24: qty 3

## 2015-02-24 MED ORDER — PREDNISONE 20 MG PO TABS
40.0000 mg | ORAL_TABLET | Freq: Once | ORAL | Status: AC
  Administered 2015-02-24: 40 mg via ORAL
  Filled 2015-02-24: qty 2

## 2015-02-24 MED ORDER — PREDNISONE 20 MG PO TABS: 40.0000 mg | ORAL_TABLET | Freq: Every day | ORAL | Status: DC

## 2015-02-24 MED ORDER — ALBUTEROL SULFATE HFA 108 (90 BASE) MCG/ACT IN AERS
2.0000 | INHALATION_SPRAY | Freq: Once | RESPIRATORY_TRACT | Status: AC
  Administered 2015-02-24: 2 via RESPIRATORY_TRACT
  Filled 2015-02-24: qty 6.7

## 2015-02-24 NOTE — Care Management Note (Signed)
Case Management Note  Patient Details  Name: Jordan Hall MRN: [id number] Date of Birth: 14-Jun-1985  Subjective/Objective:    29 y.o. F [place]. [place] Child Health [place]. Expecting [place] Access approval within next week. Pt unable to bring Nebulizer machine from [place]. Encouraged to call Medicaid office and ask about time limit for ordering another machine. In the meantime EDP will provide Inhaler which was used here in the ED. No further CM needs.                 Action/Plan: pt  confirms [place] resident  [facility] CM discussed and provided  importance of pcp vs EDP services for f/u care. medication resources, Pt voiced understanding and appreciation of resources provided.  Expected Discharge Date:                  Expected Discharge Plan:  Home/Self Care  In-House Referral:     Discharge planning Services  CM Consult, Medication Assistance (pt has Medicaid)  Post Acute Care Choice:    Choice offered to:     DME Arranged:    DME Agency:     HH Arranged:    HH Agency:     Status of Service:  Completed, signed off  Medicare Important Message Given:    Date Medicare IM Given:    Medicare IM give by:    Date Additional Medicare IM Given:    Additional Medicare Important Message give by:     If discussed at Long Length of Stay Meetings, dates discussed:    Additional Comments:  Yvone NeuCrutchfield, Jordan Beggs M, RN 02/24/2015, 12:13 PM

## 2015-02-24 NOTE — ED Provider Notes (Signed)
CSN: [id number]     Arrival date & time 02/24/15  7829 History   First MD Initiated Contact with Patient 02/24/15 [phone]     Chief Complaint  Patient presents with  . URI  . Generalized Body Aches  . Sore Throat     (Consider location/radiation/quality/duration/timing/severity/associated sxs/prior Treatment) Patient is a 29 y.o. female presenting with URI and pharyngitis. The history is provided by the patient and medical records. No language interpreter was used.  URI Presenting symptoms: congestion, cough, ear pain, fever and sore throat   Associated symptoms: arthralgias, headaches, myalgias and wheezing   Sore Throat Associated symptoms include arthralgias, congestion, coughing, a fever, headaches, myalgias and a sore throat. Pertinent negatives include no abdominal pain, diaphoresis, nausea, rash, vomiting or weakness.  Jordan Hall is a 29 y.o. female  with a PMH of asthma who presents to the Emergency Department complaining of persistent congestion, sore throat and ear pain x 3 days. Patient has tried multiple OTC products with no relief. Associated symptoms include headache, body aches, mildly productive cough. Pt. Also ran out of inhaler approx. 2 weeks ago, and has noticed increasing SOB, wheezing over the past week.     Past Medical History  Diagnosis Date  . Hx of chlamydia infection   . Abnormal Pap smear   . Asthma    Past Surgical History  Procedure Laterality Date  . Colposcopy     Family History  Problem Relation Age of Onset  . Asthma Daughter   . Hypertension Mother   . Thyroid disease Mother   . Hypertension Maternal Aunt   . Hypertension Maternal Grandmother   . Thyroid disease Maternal Grandmother    Social History  Substance Use Topics  . Smoking status: Current Every Day Smoker -- 0.25 packs/day    Types: Cigarettes  . Smokeless tobacco: Never Used  . Alcohol Use: Yes     Comment: occasionally   OB History    Gravida Para Term Preterm AB TAB  SAB Ectopic Multiple Living   0 0 2     Review of Systems  Constitutional: Positive for fever. Negative for diaphoresis.  HENT: Positive for congestion, ear pain, postnasal drip and sore throat.   Eyes: Negative for visual disturbance.  Respiratory: Positive for cough, chest tightness, shortness of breath and wheezing. Negative for choking and stridor.   Cardiovascular: Negative.   Gastrointestinal: Negative for nausea, vomiting, abdominal pain, diarrhea and constipation.  Endocrine: Negative for polydipsia and polyuria.  Musculoskeletal: Positive for myalgias and arthralgias.  Skin: Negative for rash.  Neurological: Positive for headaches. Negative for dizziness and weakness.      Allergies  Latex  Home Medications   Prior to Admission medications   Medication Sig Start Date End Date Taking? Authorizing Provider  guaiFENesin (MUCINEX) 600 MG 12 hr tablet Take 1,200 mg by mouth 2 (two) times daily.   Yes Historical Provider, MD  ibuprofen (ADVIL,MOTRIN) 200 MG tablet Take 400 mg by mouth every 6 (six) hours as needed for mild pain.    Yes Historical Provider, MD  albuterol (PROVENTIL HFA;VENTOLIN HFA) 108 (90 BASE) MCG/ACT inhaler Inhale 2 puffs into the lungs every 4 (four) hours as needed for wheezing or shortness of breath. 02/24/15   Chase Picket Ward, PA-C  predniSONE (DELTASONE) 20 MG tablet Take 2 tablets (40 mg total) by mouth daily. 02/24/15   Jaime Pilcher Ward, PA-C   BP 123/67 mmHg  Pulse 122  Temp(Src) 101.8  F (38.8 C) (Oral)  Resp 25  Ht  (1.626 m)  Wt 168 lb (76.204 kg)  BMI 28.82 kg/m2  SpO2 92%  LMP 02/24/2015  Breastfeeding? Unknown Physical Exam  Constitutional: She is oriented to person, place, and time. She appears well-developed and well-nourished.  Ill-appearing but in no acute distress  HENT:  Head: Normocephalic and atraumatic.  Right Ear: Hearing and external ear normal. No drainage or tenderness. Tympanic membrane is  erythematous. No middle ear effusion.  Left Ear: Hearing, tympanic membrane, external ear and ear canal normal.  Nose: Rhinorrhea present. Right sinus exhibits no maxillary sinus tenderness and no frontal sinus tenderness. Left sinus exhibits no maxillary sinus tenderness and no frontal sinus tenderness.  Mouth/Throat: Oropharyngeal exudate and posterior oropharyngeal erythema present.  Large pus pocket of left tonsil   Cardiovascular: Normal rate, regular rhythm, normal heart sounds and intact distal pulses.  Exam reveals no gallop and no friction rub.   No murmur heard. Pulmonary/Chest: Effort normal. No respiratory distress.  Bilateral inspiratory and expiratory wheezing.  Coughing fits with deep breathing.   Abdominal: She exhibits no mass. There is no rebound and no guarding.  Abdomen soft, non-tender, non-distended Bowel sounds positive in all four quadrants   Musculoskeletal: She exhibits no edema.  Neurological: She is alert and oriented to person, place, and time.  Skin: Skin is warm and dry. No rash noted.  Psychiatric: She has a normal mood and affect. Her behavior is normal. Judgment and thought content normal.  Nursing note and vitals reviewed.   ED Course  Procedures (including critical care time) Labs Review Labs Reviewed  RAPID STREP SCREEN (NOT AT [facility]) - Abnormal; Notable for the following:    Streptococcus, Group A Screen (Direct) POSITIVE (*)    All other components within normal limits  MONONUCLEOSIS SCREEN    Imaging Review Dg Chest 2 View  02/24/2015  CLINICAL DATA:  Pt c/o cold symptoms. Sore throat, body aches and pain in ears x 3 days. Pt has tried over the counter medications no relief. Flu like symptoms/chest tightness/pain all over/fever EXAM: CHEST  2 VIEW COMPARISON:  11/21/2014 FINDINGS: Cardiac silhouette normal in size and configuration. Normal mediastinal and hilar contours. Lungs essentially clear.  No pleural effusion or pneumothorax. Bony thorax  is intact. IMPRESSION: No active cardiopulmonary disease. Electronically Signed   By: Amie Portland M.D.   On: 02/24/2015 10:49   I have personally reviewed and evaluated these images and lab results as part of my medical decision-making.   EKG Interpretation None      MDM   Final diagnoses:  Strep pharyngitis   Francina Ames presents with sore throat and ear pain. Large pus pocket on L tonsil with high clinical suspicion for strep.  Significant wheezing bilaterally - will duoneb and reassess. Pt. States hx of asthma and ran out of inhaler 2 weeks ago.   Strep + Mono Negative CXR shows no PNA, pneumo. Negative cardiopulm dz  Patient re-evaluated and wheezing improved. Will dc home with new rx for inhaler and steroids.   Patient is febrile with tonsillar exudate, cervical lymphadenopathy, & dysphagia; diagnosis of strep. Treated in the ED with steroids, fever control, and PCN IM.  Pt appears mildly dehydrated, discussed importance of water rehydration. Presentation non-concerning for PTA or infxn spread to soft tissue. No trismus or uvula deviation. Specific return precautions discussed. Pt able to drink water in ED without difficulty with intact air way. Recommended PCP follow up.  [facility] 2Jaime Pilcher Ward, PA-C 02/24/15 1148  Alvira MondayErin Schlossman, MD 02/25/15 2250

## 2015-02-24 NOTE — Progress Notes (Signed)
RT arrived to administer breathing treatment, pt not in room at this time. Will follow up.

## 2015-02-24 NOTE — ED Notes (Signed)
Pt is in stable condition upon d/c and ambulates from ED. 

## 2015-02-24 NOTE — ED Notes (Signed)
Pt c/o cold symptoms. Sore throat, body aches and pain in ears x 3 days. Pt has tried over the counter medications no relief.

## 2015-02-24 NOTE — Discharge Instructions (Signed)
1. Medications: Prednisone, inhaler as needed for shortness of breath or wheezing, may take ibuprofen every 6 hours as needed for pain, usual home medications 2. Treatment: rest, drink plenty of fluids, discard toothbrush and begin using a new one in 3 days.  3. Follow Up: Please follow up with your primary doctor in 3 days for discussion of your diagnoses and further evaluation after today's visit; if you do not have a primary care doctor use the resource guide provided to find one; Please return to the ER for worsening condition, inability to swallow, breathing difficulty, new concerns.    Emergency Department Resource Guide 1) Find a Doctor and Pay Out of Pocket Although you won't have to find out who is covered by your insurance plan, it is a good idea to ask around and get recommendations. You will then need to call the office and see if the doctor you have chosen will accept you as a new patient and what types of options they offer for patients who are self-pay. Some doctors offer discounts or will set up payment plans for their patients who do not have insurance, but you will need to ask so you aren't surprised when you get to your appointment.  2) Contact Your Local Health Department Not all health departments have doctors that can see patients for sick visits, but many do, so it is worth a call to see if yours does. If you don't know where your local health department is, you can check in your phone book. The CDC also has a tool to help you locate your state's health department, and many state websites also have listings of all of their local health departments.  3) Find a Walk-in Clinic If your illness is not likely to be very severe or complicated, you may want to try a walk in clinic. These are popping up all over the country in pharmacies, drugstores, and shopping centers. They're usually staffed by nurse practitioners or physician assistants that have been trained to treat common  illnesses and complaints. They're usually fairly quick and inexpensive. However, if you have serious medical issues or chronic medical problems, these are probably not your best option.  No Primary Care Doctor: - Call Health Connect at  [phone] - they can help you locate a primary care doctor that  accepts [phone], provides certain services, etc. - Physician Referral Service- [phone]  Chronic Pain Problems: Organization         Address  Phone   Notes  [facility]  [phone] Patients need to be referred by their primary care doctor.   Medication Assistance: Organization         Address  Phone   Notes  [facility] [address] [place] [place], [place] [address] [phone] --Must be a resident of [facility] -- Must have NO insurance coverage whatsoever (no Medicaid/ Medicare, etc.) -- The pt. MUST have a primary care doctor that directs their care regularly and follows them in the community   [facility]  [phone]   [employer]  [phone]    Agencies that provide inexpensive medical care: Organization         Address  Phone   Notes  [facility]  [phone]   [facility]    [phone]   [facility] [address] [place], [place] [address] [phone]   [facility]  Lovenia Shuck, [place] [phone]   Planned Parenthood    [phone]   [facility]    [phone]   [facility]  [address], [place] Phone:  [phone], Fax:  [phone] Hours of Operation:  9 am - 6 pm, M-F.  Also accepts Medicaid/Medicare and self-pay.  [facility]  [address], [place] Phone: [phone], Fax: [phone]. Hours of Operation:  8:30 am - 5:30 pm, M-F.  Also accepts Medicaid and self-pay.  [facility] [place]  [address] [place] Phone: [phone]   [facility] [address] Natasha Bence [place], [place] [phone], Ext. [phone] Mondays & Thursdays: 7-9 AM.  First 15 patients are seen on a first come, first serve basis.    Medicaid-accepting [facility] Providers:  Organization         Address  Phone   Notes  [facility] [address], [place] [phone] Also accepts self-pay patients.  [facility] [address] Laurell Josephs [place], [place]  [phone]   [facility] [address], [place] [phone]   [facility] [address] [place] [phone]   Renaye Rakers [address], [place]   [phone] Only accepts [place] Access [place] patients after they have their name applied to their card.   Self-Pay (no insurance) in [facility]:  Organization         Address  Phone   Notes  Sickle Cell Patients, [facility] [address] [place], [place] [phone]   [facility] [address] [place], [place] [phone]   [facility]  [address], [place] [phone]   [facility]/Dr. Osei-Bonsu  [address], [place] or [address], [place] [phone] Phone number for both [place] and [place] locations is the same.  [facility] [address] [place] [phone]   [facility] [address] [place] or [address] [phone]   [facility] [place] [facility] [address] [place] [phone], phone; [phone], fax Sees patients 1st and 3rd Saturday of every month.  Must not qualify for public or private insurance (i.e. Medicaid, Medicare, [place] Health Choice, Veterans' Benefits)  Household income should be no more than 200% of the  poverty level The clinic cannot treat you if you are pregnant or think you are pregnant  Sexually transmitted diseases are not treated at the clinic.    Dental Care: Organization         Address  Phone  Notes  [facility] Department of [facility] [address] [place], [place] [phone] Accepts children up to age 26 who are enrolled in [place] or [place] Health Choice; pregnant women with a Medicaid card; and children who have applied for Medicaid or [place] Health Choice, but were declined, whose parents can pay a reduced fee at time of service.  [facility]  [address], [place] [phone] Accepts children up to age 7 who are enrolled in [place] or [place] Health Choice; pregnant women with a Medicaid card; and children who have applied for  Medicaid or [place] Health Choice, but were declined, whose parents can pay a reduced fee at time of service.  [facility]  [address] [place], [place] [phone] Patients are seen by appointment only. Walk-ins are not accepted. [facility] will see patients 13 years of age and older. Monday - Tuesday (8am-5pm) Most Wednesdays (8:30-5pm) $30 per visit, cash only  [facility]  [address], [facility] [phone] Patients are seen by appointment only. Walk-ins are not accepted. [facility] will see patients 72 years of age and older. One Wednesday Evening (Monthly: Volunteer Based).  $30 per visit, cash only  [employer]  [phone] for adults; Children under age 38, call Graduate Pediatric Dentistry at [phone]. Children aged 89-14, please call [phone] to request a pediatric application.  Dental services are provided in all areas of dental care including fillings, crowns and bridges, complete and partial dentures, implants, gum treatment, root canals, and extractions.  Preventive care is also provided. Treatment is provided to both adults and children. Patients are selected via a lottery and there is often a waiting list.   [facility] [address], [place]  [phone] [email or web address]   [facility] [address] [place], [place] [phone], Ext. [phone] Second and Fourth Thursday of each month, opens at 6:30 AM; Clinic ends at 9 AM.  Patients are seen on a first-come first-served basis, and a limited number are seen during each clinic.   [facility]  [address] Ether Griffins [place], [place] [phone]   Eligibility Requirements You must have lived in [place], [place], or [place] counties for at least the last three months.   You cannot be eligible for state or federal sponsored [place], including [employer], [place], or [employer].   You generally cannot be eligible for healthcare insurance through your employer.    How to apply: Eligibility screenings are held every Tuesday and Wednesday afternoon from 1:00 pm until 4:00 pm. You do not need an appointment for the interview!  [facility] [address], [place], [place] [phone]   [facility] Health Department  [phone]   [facility] Health Department  [phone]   [facility]  [phone]    Behavioral Health Resources in the Community: Intensive Outpatient Programs Organization         Address  Phone  Notes  [facility] [address] [place], [place] [phone]   [facility] Outpatient [address] [place], [place] [phone]   [facility] [address] [place], [place]  [phone]   [facility] [address]  [place], [place] [phone] or [phone]   Substance Abuse Resources Organization         Address  Phone  Notes  [facility] Drug Services  [phone]   Addiction  [facility]  [phone]   [facility]  [phone]   Floydene Flock  [phone]   Residential & Outpatient Substance Abuse Program  [phone]   Psychological Services Organization         Address  Phone  Notes  [facility]  [phone]   [facility]  [phone]   [facility] [address], [place] [phone] or [phone]    Mobile Crisis Teams Organization         Address  Phone  Notes  [facility], Mobile Crisis Care Unit  [phone]   [facility]  [address] [place], [place] [phone]   [facility] [address] [place] [phone]    Self-Help/Support Groups Organization         Address  Phone             Notes  [facility] [place] - variety of support groups  [phone]- [phone] Call for more information  Narcotics Anonymous (NA), [facility] [address], [employer] [place]  2 meetings at this location   Statistician         Address  Phone  Notes  [facility] [address],    [place]  [phone]   [facility]  [address] [place] [address], [place], [place] [phone]   [facility] [address] [place], [place] [phone] Admissions: 8am-3pm M-F  [facility] [address]    [place], [place] [phone]   [facility] [address] [place], [place], [place] [phone]   [facility] [address],  [place], [place] [phone]   [facility] - Intensive Outpatient [address] Laurell Josephs [address], [place], [place] [phone]   [facility] ([facility]iction [facility] [address] [place]  [place], [place] [phone] or [phone]   [facility] ([facility]) [address] [place], [place] [phone] Accepts Medicaid  [facility] [address],  [place]  [phone] Substance Abuse/Addiction Treatment   [facility] Organization         Address  Phone  Notes  [facility]  [phone]   Angie Fava, PhD [address] Ervin Knack [place], [place]   [phone] or [phone]   [facility]   [address] [place], [place] [phone]   [facility] [address], [place], [place] [phone] Insurance/Medicaid/sponsorship through [facility] Families [address]                                    [place], [place] [phone] Therapy/tele-psych/case  [facility] [address], [place] [phone]    Dr. Lolly Mustache  [phone]   [facility]  [employer] [facility] Dept. 1) [address] [place] 2) [address] [place] 3)  [address], [place] [phone]  [phone]   [facility] Child Abuse Hotline [phone] or [phone] (After Hours)      Strep Throat Strep throat is an infection of the throat. It is caused by germs. Strep throat spreads from person to person because of coughing, sneezing, or close contact. HOME CARE Medicines  Take over-the-counter and prescription medicines only as told by your doctor.  Take your antibiotic medicine as told by your doctor. Do not stop taking the medicine even if you feel better.  Have family members who also have a sore throat or fever go to a doctor. Eating and Drinking  Do not share food, drinking cups, or personal items.  Try eating soft foods until your sore throat feels better.  Drink enough fluid to keep your pee (urine) clear or pale yellow. General Instructions  Rinse your mouth (gargle) with a salt-water mixture  3-4 times per day or as needed. To make a salt-water mixture, stir -1 tsp of salt into 1 cup of warm water.  Make sure that all people in your house wash their hands  well.  Rest.  Stay home from school or work until you have been taking antibiotics for 24 hours.  Keep all follow-up visits as told by your doctor. This is important. GET HELP IF:  Your neck keeps getting bigger.  You get a rash, cough, or earache.  You cough up thick liquid that is green, yellow-brown, or bloody.  You have pain that does not get better with medicine.  Your problems get worse instead of getting better.  You have a fever. GET HELP RIGHT AWAY IF:  You throw up (vomit).  You get a very bad headache.  You neck hurts or it feels stiff.  You have chest pain or you are short of breath.  You have drooling, very bad throat pain, or changes in your voice.  Your neck is swollen or the skin gets red and tender.  Your mouth is dry or you are peeing less than normal.  You keep feeling more tired or it is hard to wake up.  Your joints are red or they hurt.   This information is not intended to replace advice given to you by your health care provider. Make sure you discuss any questions you have with your health care provider.   Document Released: 09/16/2007 Document Revised: 12/19/2014 Document Reviewed: 07/23/2014 Elsevier Interactive Patient Education [employer]2016 Elsevier Inc.

## 2015-02-26 ENCOUNTER — Emergency Department (HOSPITAL_COMMUNITY)
Admission: EM | Admit: 2015-02-26 | Discharge: 2015-02-26 | Disposition: A | Discharge status: Home / Self Care | Payer: Medicaid Other | Attending: Emergency Medicine | Admitting: Emergency Medicine

## 2015-02-26 ENCOUNTER — Encounter (HOSPITAL_COMMUNITY): Payer: Self-pay | Admitting: Emergency Medicine

## 2015-02-26 DIAGNOSIS — Z8619 Personal history of other infectious and parasitic diseases: Secondary | ICD-10-CM | POA: Insufficient documentation

## 2015-02-26 DIAGNOSIS — Z79899 Other long term (current) drug therapy: Secondary | ICD-10-CM | POA: Insufficient documentation

## 2015-02-26 DIAGNOSIS — H578 Other specified disorders of eye and adnexa: Secondary | ICD-10-CM | POA: Insufficient documentation

## 2015-02-26 DIAGNOSIS — Z91040 Latex allergy status: Secondary | ICD-10-CM | POA: Insufficient documentation

## 2015-02-26 DIAGNOSIS — Z7952 Long term (current) use of systemic steroids: Secondary | ICD-10-CM | POA: Insufficient documentation

## 2015-02-26 DIAGNOSIS — J45909 Unspecified asthma, uncomplicated: Secondary | ICD-10-CM | POA: Insufficient documentation

## 2015-02-26 DIAGNOSIS — R6889 Other general symptoms and signs: Secondary | ICD-10-CM

## 2015-02-26 DIAGNOSIS — F17210 Nicotine dependence, cigarettes, uncomplicated: Secondary | ICD-10-CM | POA: Insufficient documentation

## 2015-02-26 NOTE — ED Provider Notes (Signed)
CSN: [id number]     Arrival date & time 02/26/15  [id number] History   First MD Initiated Contact with Patient 02/26/15 [phone]     Chief Complaint  Patient presents with  . Eye Problem     (Consider location/radiation/quality/duration/timing/severity/associated sxs/prior Treatment) HPI Comments: 29 y/o F c/o gradual onset, occasional itching of R eye beginning last night. She is concerned because she believes her daughter may have pink eye. No aggravating or alleviating factors. Has not noticed any redness, drainage or swelling. Denies vision change, eye pain. She was seen here in the ED on 11/13 for strep throat and treated with abx and is feeling much better. Denies fevers.  The history is provided by the patient.    Past Medical History  Diagnosis Date  . Hx of chlamydia infection   . Abnormal Pap smear   . Asthma    Past Surgical History  Procedure Laterality Date  . Colposcopy     Family History  Problem Relation Age of Onset  . Asthma Daughter   . Hypertension Mother   . Thyroid disease Mother   . Hypertension Maternal Aunt   . Hypertension Maternal Grandmother   . Thyroid disease Maternal Grandmother    Social History  Substance Use Topics  . Smoking status: Current Every Day Smoker -- 0.25 packs/day    Types: Cigarettes  . Smokeless tobacco: Never Used  . Alcohol Use: Yes     Comment: occasionally   OB History    Gravida Para Term Preterm AB TAB SAB Ectopic Multiple Living   9 4 3 1 4 3 1  0 0 2     Review of Systems  Eyes: Positive for itching.  All other systems reviewed and are negative.     Allergies  Latex  Home Medications   Prior to Admission medications   Medication Sig Start Date End Date Taking? Authorizing Provider  albuterol (PROVENTIL HFA;VENTOLIN HFA) 108 (90 BASE) MCG/ACT inhaler Inhale 2 puffs into the lungs every 4 (four) hours as needed for wheezing or shortness of breath. 02/24/15   Jaime Pilcher Ward, PA-C  guaiFENesin (MUCINEX) 600 MG  12 hr tablet Take 1,200 mg by mouth 2 (two) times daily.    Historical Provider, MD  ibuprofen (ADVIL,MOTRIN) 200 MG tablet Take 400 mg by mouth every 6 (six) hours as needed for mild pain.     Historical Provider, MD  predniSONE (DELTASONE) 20 MG tablet Take 2 tablets (40 mg total) by mouth daily. 02/24/15   Jaime Pilcher Ward, PA-C   BP 135/84 mmHg  Pulse 80  Temp(Src) 97.9 F (36.6 C) (Oral)  Resp 16  Ht 5\' 4"  (1.626 m)  Wt 168 lb (76.204 kg)  BMI 28.82 kg/m2  SpO2 98%  LMP 02/24/2015 Physical Exam  Constitutional: She is oriented to person, place, and time. She appears well-developed and well-nourished. No distress.  HENT:  Head: Normocephalic and atraumatic.  Mouth/Throat: Oropharynx is clear and moist.  Eyes: Conjunctivae and EOM are normal. Pupils are equal, round, and reactive to light.  No conjunctival and ejection bilateral. No drainage. No swelling.  Neck: Normal range of motion. Neck supple.  Cardiovascular: Normal rate, regular rhythm and normal heart sounds.   Pulmonary/Chest: Effort normal and breath sounds normal.  Musculoskeletal: Normal range of motion. She exhibits no edema.  Lymphadenopathy:    She has no cervical adenopathy.  Neurological: She is alert and oriented to person, place, and time.  Skin: Skin is warm and dry. She is not  diaphoretic.  Psychiatric: She has a normal mood and affect. Her behavior is normal.    ED Course  Procedures (including critical care time) Labs Review Labs Reviewed - No data to display  Imaging Review Dg Chest 2 View  02/24/2015  CLINICAL DATA:  Pt c/o cold symptoms. Sore throat, body aches and pain in ears x 3 days. Pt has tried over the counter medications no relief. Flu like symptoms/chest tightness/pain all over/fever EXAM: CHEST  2 VIEW COMPARISON:  11/21/2014 FINDINGS: Cardiac silhouette normal in size and configuration. Normal mediastinal and hilar contours. Lungs essentially clear.  No pleural effusion or  pneumothorax. Bony thorax is intact. IMPRESSION: No active cardiopulmonary disease. Electronically Signed   By: Amie Portland M.D.   On: 02/24/2015 10:49   I have personally reviewed and evaluated these images and lab results as part of my medical decision-making.   EKG Interpretation None      MDM   Final diagnoses:  Itchy eyes   Nontoxic appearing, NAD. AF VSS. Both of her eyes are without conjunctival injection. No drainage, crusting, swelling. She has not noticed any color change or drainage. I do not feel treatment for bacterial conjunctivitis is appropriate at this time. It is possible this is allergic in nature. Cool compresses. Stable for d/c. Return precautions given. Pt/family/caregiver aware medical decision making process and agreeable with plan.  Kathrynn Speed, PA-C 02/26/15 1004  Arby Barrette, MD 02/27/15 0830

## 2015-02-26 NOTE — ED Notes (Signed)
Pt's child is here for pink-eye. Mother wants too be checked for same.

## 2015-02-26 NOTE — Discharge Instructions (Signed)
If your eye becomes red, crusting, draining or swollen please return here or to your doctor's office. Allergies An allergy is an abnormal reaction to a substance by the body's defense system (immune system). Allergies can develop at any age. WHAT CAUSES ALLERGIES? An allergic reaction happens when the immune system mistakenly reacts to a normally harmless substance, called an allergen, as if it were harmful. The immune system releases antibodies to fight the substance. Antibodies eventually release a chemical called histamine into the bloodstream. The release of histamine is meant to protect the body from infection, but it also causes discomfort. An allergic reaction can be triggered by:  Eating an allergen.  Inhaling an allergen.  Touching an allergen. WHAT TYPES OF ALLERGIES ARE THERE? There are many types of allergies. Common types include:  Seasonal allergies. People with this type of allergy are usually allergic to substances that are only present during certain seasons, such as molds and pollens.  Food allergies.  Drug allergies.  Insect allergies.  Animal dander allergies. WHAT ARE SYMPTOMS OF ALLERGIES? Possible allergy symptoms include:  Swelling of the lips, face, tongue, mouth, or throat.  Sneezing, coughing, or wheezing.  Nasal congestion.  Tingling in the mouth.  Rash.  Itching.  Itchy, red, swollen areas of skin (hives).  Watery eyes.  Vomiting.  Diarrhea.  Dizziness.  Lightheadedness.  Fainting.  Trouble breathing or swallowing.  Chest tightness.  Rapid heartbeat. HOW ARE ALLERGIES DIAGNOSED? Allergies are diagnosed with a medical and family history and one or more of the following:  Skin tests.  Blood tests.  A food diary. A food diary is a record of all the foods and drinks you have in a day and of all the symptoms you experience.  The results of an elimination diet. An elimination diet involves eliminating foods from your diet and  then adding them back in one by one to find out if a certain food causes an allergic reaction. HOW ARE ALLERGIES TREATED? There is no cure for allergies, but allergic reactions can be treated with medicine. Severe reactions usually need to be treated at a hospital. HOW CAN REACTIONS BE PREVENTED? The best way to prevent an allergic reaction is by avoiding the substance you are allergic to. Allergy shots and medicines can also help prevent reactions in some cases. People with severe allergic reactions may be able to prevent a life-threatening reaction called anaphylaxis with a medicine given right after exposure to the allergen.   This information is not intended to replace advice given to you by your health care provider. Make sure you discuss any questions you have with your health care provider.   Document Released: 06/23/2002 Document Revised: 04/20/2014 Document Reviewed: 01/09/2014 Elsevier Interactive Patient Education [employer]2016 Elsevier Inc.

## 2015-04-15 ENCOUNTER — Ambulatory Visit (INDEPENDENT_AMBULATORY_CARE_PROVIDER_SITE_OTHER): Payer: Self-pay | Admitting: General Practice

## 2015-04-15 ENCOUNTER — Other Ambulatory Visit (HOSPITAL_COMMUNITY)
Admission: RE | Admit: 2015-04-15 | Discharge: 2015-04-15 | Disposition: A | Discharge status: Home / Self Care | Payer: Medicaid Other | Source: Ambulatory Visit | Attending: Obstetrics & Gynecology | Admitting: Obstetrics & Gynecology

## 2015-04-15 ENCOUNTER — Encounter: Payer: Self-pay | Admitting: Obstetrics & Gynecology

## 2015-04-15 DIAGNOSIS — Z113 Encounter for screening for infections with a predominantly sexual mode of transmission: Secondary | ICD-10-CM | POA: Insufficient documentation

## 2015-04-15 DIAGNOSIS — Z3491 Encounter for supervision of normal pregnancy, unspecified, first trimester: Secondary | ICD-10-CM

## 2015-04-15 LAB — POCT PREGNANCY, URINE: PREG TEST UR: POSITIVE — AB

## 2015-04-15 LAB — HCG, QUANTITATIVE, PREGNANCY: hCG, Beta Chain, Quant, S: 485 m[IU]/mL — ABNORMAL HIGH

## 2015-04-15 NOTE — Addendum Note (Signed)
Addended by: Kathee DeltonHILLMAN, Ishana Blades L on: 04/15/2015 02:38 PM   Modules accepted: Orders

## 2015-04-15 NOTE — Progress Notes (Signed)
Patient here for upt. upt positive. Reports first positive home test last Wednesday. Patient reports unsure LMP. States she had one day of spotting on 02/24/15. Patient states she had a TAB in October. Spoke with Dr Debroah LoopArnold regarding patient who recommended bhcg level today and u/s scheduled. Informed patient and told her we will contact her with that ultrasound appt soon. Patient requests to start care here. New ob packet given and will get lab work today. Patient had no questions. Encouraged patient to take PNV and make new OB appt after lab work.

## 2015-04-16 ENCOUNTER — Telehealth: Payer: Self-pay

## 2015-04-16 LAB — PRENATAL PROFILE (SOLSTAS)
Antibody Screen: NEGATIVE
BASOS PCT: 0 % (ref 0–1)
Basophils Absolute: 0 10*3/uL (ref 0.0–0.1)
Eosinophils Absolute: 0 10*3/uL (ref 0.0–0.7)
Eosinophils Relative: 0 % (ref 0–5)
HEMATOCRIT: 40.9 % (ref 36.0–46.0)
HEMOGLOBIN: 13.8 g/dL (ref 12.0–15.0)
HEP B S AG: NEGATIVE
HIV: NONREACTIVE
LYMPHS PCT: 19 % (ref 12–46)
Lymphs Abs: 1.8 10*3/uL (ref 0.7–4.0)
MCH: 31.8 pg (ref 26.0–34.0)
MCHC: 33.7 g/dL (ref 30.0–36.0)
MCV: 94.2 fL (ref 78.0–100.0)
MPV: 9.5 fL (ref 8.6–12.4)
Monocytes Absolute: 0.3 10*3/uL (ref 0.1–1.0)
Monocytes Relative: 3 % (ref 3–12)
NEUTROS ABS: 7.5 10*3/uL (ref 1.7–7.7)
Neutrophils Relative %: 78 % — ABNORMAL HIGH (ref 43–77)
Platelets: 310 10*3/uL (ref 150–400)
RBC: 4.34 MIL/uL (ref 3.87–5.11)
RDW: 13.7 % (ref 11.5–15.5)
Rh Type: POSITIVE
Rubella: 2.5 Index — ABNORMAL HIGH (ref <0.90)
WBC: 9.6 10*3/uL (ref 4.0–10.5)

## 2015-04-16 LAB — GC/CHLAMYDIA PROBE AMP (~~LOC~~) NOT AT ARMC
Chlamydia: NEGATIVE
NEISSERIA GONORRHEA: NEGATIVE

## 2015-04-16 LAB — CULTURE, OB URINE

## 2015-04-16 NOTE — Telephone Encounter (Signed)
OB US scheduled January 11th @ 1500.  Notified pt of US appt.

## 2015-04-17 LAB — PRESCRIPTION MONITORING PROFILE (19 PANEL)
Amphetamine/Meth: NEGATIVE ng/mL
BENZODIAZEPINE SCREEN, URINE: NEGATIVE ng/mL
BUPRENORPHINE, URINE: NEGATIVE ng/mL
Barbiturate Screen, Urine: NEGATIVE ng/mL
CANNABINOID SCRN UR: NEGATIVE ng/mL
COCAINE METABOLITES: NEGATIVE ng/mL
Carisoprodol, Urine: NEGATIVE ng/mL
Creatinine, Urine: 245.66 mg/dL (ref >20.0)
ECSTASY: NEGATIVE ng/mL
FENTANYL URINE: NEGATIVE ng/mL
MEPERIDINE UR: NEGATIVE ng/mL
METHADONE SCREEN, URINE: NEGATIVE ng/mL
METHAQUALONE SCREEN (URINE): NEGATIVE ng/mL
Nitrites, Initial: NEGATIVE ug/mL
OXYCODONE SCRN UR: NEGATIVE ng/mL
Opiate Screen, Urine: NEGATIVE ng/mL
PH URINE, INITIAL: 5.7 pH (ref 4.5–8.9)
PHENCYCLIDINE, UR: NEGATIVE ng/mL
Propoxyphene: NEGATIVE ng/mL
Tapentadol, urine: NEGATIVE ng/mL
Tramadol Scrn, Ur: NEGATIVE ng/mL
ZOLPIDEM, URINE: NEGATIVE ng/mL

## 2015-04-17 LAB — HEMOGLOBINOPATHY EVALUATION
HEMOGLOBIN OTHER: 0 %
Hgb A2 Quant: 2.6 % (ref 2.2–3.2)
Hgb A: 95.3 % — ABNORMAL LOW (ref 96.8–97.8)
Hgb F Quant: 2.1 % — ABNORMAL HIGH (ref 0.0–2.0)
Hgb S Quant: 0 %

## 2015-04-24 ENCOUNTER — Ambulatory Visit (HOSPITAL_COMMUNITY): Payer: Medicaid Other

## 2015-08-04 ENCOUNTER — Emergency Department (HOSPITAL_COMMUNITY)
Admission: EM | Admit: 2015-08-04 | Discharge: 2015-08-04 | Disposition: A | Discharge status: Home / Self Care | Payer: Medicaid Other | Attending: Emergency Medicine | Admitting: Emergency Medicine

## 2015-08-04 ENCOUNTER — Encounter (HOSPITAL_COMMUNITY): Payer: Self-pay | Admitting: Family Medicine

## 2015-08-04 DIAGNOSIS — R000 Tachycardia, unspecified: Secondary | ICD-10-CM | POA: Insufficient documentation

## 2015-08-04 DIAGNOSIS — Z79899 Other long term (current) drug therapy: Secondary | ICD-10-CM | POA: Insufficient documentation

## 2015-08-04 DIAGNOSIS — B9689 Other specified bacterial agents as the cause of diseases classified elsewhere: Secondary | ICD-10-CM

## 2015-08-04 DIAGNOSIS — Z8619 Personal history of other infectious and parasitic diseases: Secondary | ICD-10-CM | POA: Insufficient documentation

## 2015-08-04 DIAGNOSIS — Z7952 Long term (current) use of systemic steroids: Secondary | ICD-10-CM | POA: Insufficient documentation

## 2015-08-04 DIAGNOSIS — Z91040 Latex allergy status: Secondary | ICD-10-CM | POA: Insufficient documentation

## 2015-08-04 DIAGNOSIS — F17210 Nicotine dependence, cigarettes, uncomplicated: Secondary | ICD-10-CM | POA: Insufficient documentation

## 2015-08-04 DIAGNOSIS — Z3202 Encounter for pregnancy test, result negative: Secondary | ICD-10-CM | POA: Insufficient documentation

## 2015-08-04 DIAGNOSIS — N760 Acute vaginitis: Secondary | ICD-10-CM | POA: Insufficient documentation

## 2015-08-04 DIAGNOSIS — J45909 Unspecified asthma, uncomplicated: Secondary | ICD-10-CM | POA: Insufficient documentation

## 2015-08-04 LAB — POC URINE PREG, ED: Preg Test, Ur: NEGATIVE

## 2015-08-04 LAB — URINALYSIS, ROUTINE W REFLEX MICROSCOPIC
Bilirubin Urine: NEGATIVE
GLUCOSE, UA: NEGATIVE mg/dL
Hgb urine dipstick: NEGATIVE
Ketones, ur: NEGATIVE mg/dL
LEUKOCYTES UA: NEGATIVE
Nitrite: NEGATIVE
PROTEIN: NEGATIVE mg/dL
Specific Gravity, Urine: 1.029 (ref 1.005–1.030)
pH: 6 (ref 5.0–8.0)

## 2015-08-04 LAB — WET PREP, GENITAL
TRICH WET PREP: NONE SEEN
Yeast Wet Prep HPF POC: NONE SEEN

## 2015-08-04 MED ORDER — METRONIDAZOLE 500 MG PO TABS: 500.0000 mg | ORAL_TABLET | Freq: Two times a day (BID) | ORAL | Status: DC

## 2015-08-04 NOTE — ED Provider Notes (Signed)
CSN: [id number]     Arrival date & time 08/04/15  1423 History   First MD Initiated Contact with Patient 08/04/15 1442     Chief Complaint  Patient presents with  . Vaginal Discharge     (Consider location/radiation/quality/duration/timing/severity/associated sxs/prior Treatment) Patient is a 30 y.o. female presenting with vaginal discharge. The history is provided by the patient. No language interpreter was used.  Vaginal Discharge Quality:  Watery Onset quality:  Gradual Duration:  1 week Timing:  Intermittent Progression:  Worsening Chronicity:  New Context: spontaneously   Relieved by:  None tried Worsened by:  Nothing tried Ineffective treatments:  None tried  Jordan AmesKelly Hall is a 30 y.o. [phone] who presents to the ED with vaginal discharge that started about a week ago and has gotten worse. The discharge is watery and has an odor. Patient states she has had BV in the past and this is similar. She has been with her current sex partner 1 year, [facility], she does have a history of GC and Chlamydia in the past. Patients does not use birth control and states she may be pregnant. Patient denies pain or bleeding. LMP 07/01/15.  Past Medical History  Diagnosis Date  . Hx of chlamydia infection   . Abnormal Pap smear   . Asthma    Past Surgical History  Procedure Laterality Date  . Colposcopy     Family History  Problem Relation Age of Onset  . Asthma Daughter   . Hypertension Mother   . Thyroid disease Mother   . Hypertension Maternal Aunt   . Hypertension Maternal Grandmother   . Thyroid disease Maternal Grandmother    Social History  Substance Use Topics  . Smoking status: Current Every Day Smoker -- 0.25 packs/day    Types: Cigarettes  . Smokeless tobacco: Never Used  . Alcohol Use: Yes     Comment: occasionally   OB History    Gravida Para Term Preterm AB TAB SAB Ectopic Multiple Living   9 4 3 1 4 3 1  0 0 2      Review of Systems  Genitourinary: Positive for vaginal discharge.  all other systems negative    Allergies  Latex  Home Medications   Prior to Admission medications   Medication Sig Start Date End Date Taking? Authorizing Provider  albuterol (PROVENTIL HFA;VENTOLIN HFA) 108 (90 BASE) MCG/ACT inhaler Inhale 2 puffs into the lungs every 4 (four) hours as needed for wheezing or shortness of breath. 02/24/15   Jaime Pilcher Ward, PA-C  guaiFENesin (MUCINEX) 600 MG 12 hr tablet Take 1,200 mg by mouth 2 (two) times daily.    Historical Provider, MD  ibuprofen (ADVIL,MOTRIN) 200 MG tablet Take 400 mg by mouth every 6 (six) hours as needed for mild pain.     Historical Provider, MD  metroNIDAZOLE (FLAGYL) 500 MG tablet Take 1 tablet (500 mg total) by mouth 2 (two) times daily. 08/04/15   Harel Repetto Orlene OchM Kateleen Encarnacion, NP  predniSONE (DELTASONE) 20 MG tablet Take 2 tablets (40 mg total) by mouth daily. 02/24/15   Jaime Pilcher Ward, PA-C   BP 136/99 mmHg  Pulse 101  Temp(Src) 99.1 F (37.3 C)  Resp 18  SpO2 98%  LMP 06/30/2015 Physical Exam  Constitutional: She is oriented to person, place, and time. She appears well-developed and well-nourished.  HENT:  Head: Normocephalic and atraumatic.  Eyes: EOM are normal.  Neck: Neck supple.  Cardiovascular: Tachycardia present.   Pulmonary/Chest:  Effort normal.  Abdominal: Soft. There is no tenderness.  Genitourinary:  External genitalia without lesions, watery d/c vaginal vault, cervix closed, no CMT, no adnexa tenderness or mass palpated. Uterus without palpable enlargement.   Musculoskeletal: Normal range of motion.  Neurological: She is alert and oriented to person, place, and time. No cranial nerve deficit.  Skin: Skin is warm and dry.  Psychiatric: She has a normal mood and affect. Her behavior is normal.  Nursing note and vitals reviewed.   ED Course  Procedures (including critical care time) Labs Review Results for orders placed or performed  during the hospital encounter of 08/04/15 (from the past 24 hour(s))  POC urine preg, ED (not at [facility])     Status: None   Collection Time: 08/04/15  3:03 PM  Result Value Ref Range   Preg Test, Ur NEGATIVE NEGATIVE  Urinalysis, Routine w reflex microscopic-may I&O cath if menses (not at [facility])     Status: Abnormal   Collection Time: 08/04/15  3:05 PM  Result Value Ref Range   Color, Urine AMBER (A) YELLOW   APPearance CLEAR CLEAR   Specific Gravity, Urine 1.029 1.005 - 1.030   pH 6.0 5.0 - 8.0   Glucose, UA NEGATIVE NEGATIVE mg/dL   Hgb urine dipstick NEGATIVE NEGATIVE   Bilirubin Urine NEGATIVE NEGATIVE   Ketones, ur NEGATIVE NEGATIVE mg/dL   Protein, ur NEGATIVE NEGATIVE mg/dL   Nitrite NEGATIVE NEGATIVE   Leukocytes, UA NEGATIVE NEGATIVE  Wet prep, genital     Status: Abnormal   Collection Time: 08/04/15  3:29 PM  Result Value Ref Range   Yeast Wet Prep HPF POC NONE SEEN NONE SEEN   Trich, Wet Prep NONE SEEN NONE SEEN   Clue Cells Wet Prep HPF POC PRESENT (A) NONE SEEN   WBC, Wet Prep HPF POC MODERATE (A) NONE SEEN   Sperm PRESENT     Imaging Review No results found. I have personally reviewed and evaluated the lab results as part of my medical decision-making.   MDM  30 y.o. female with vaginal d/c with odor stable for d/c without pain or bleeding. Will treat with flagyl for BV and she will f/u with the [facility]. Discussed with the patient and all questioned fully answered.  If she does not start her period in the next week she will take a HPT.   Final diagnoses:  Bacterial vaginosis       Janne Napoleon, NP 08/04/15 1622  Vanetta Mulders, MD 08/05/15 [phone]

## 2015-08-04 NOTE — ED Notes (Signed)
Pt here for vaginal discharge and possible pregnancy.

## 2015-08-04 NOTE — Discharge Instructions (Signed)
Your pregnancy test today is negative, if you do not have a period in the next week take a home pregnancy test. Since you period is just a few days late it may be to early for it to show up positive.   Bacterial Vaginosis Bacterial vaginosis is a vaginal infection that occurs when the normal balance of bacteria in the vagina is disrupted. It results from an overgrowth of certain bacteria. This is the most common vaginal infection in women of childbearing age. Treatment is important to prevent complications, especially in pregnant women, as it can cause a premature delivery. CAUSES  Bacterial vaginosis is caused by an increase in harmful bacteria that are normally present in smaller amounts in the vagina. Several different kinds of bacteria can cause bacterial vaginosis. However, the reason that the condition develops is not fully understood. RISK FACTORS Certain activities or behaviors can put you at an increased risk of developing bacterial vaginosis, including:  Having a new sex partner or multiple sex partners.  Douching.  Using an intrauterine device (IUD) for contraception. Women do not get bacterial vaginosis from toilet seats, bedding, swimming pools, or contact with objects around them. SIGNS AND SYMPTOMS  Some women with bacterial vaginosis have no signs or symptoms. Common symptoms include:  Grey vaginal discharge.  A fishlike odor with discharge, especially after sexual intercourse.  Itching or burning of the vagina and vulva.  Burning or pain with urination. DIAGNOSIS  Your health care provider will take a medical history and examine the vagina for signs of bacterial vaginosis. A sample of vaginal fluid may be taken. Your health care provider will look at this sample under a microscope to check for bacteria and abnormal cells. A vaginal pH test may also be done.  TREATMENT  Bacterial vaginosis may be treated with antibiotic medicines. These may be given in the form of a pill  or a vaginal cream. A second round of antibiotics may be prescribed if the condition comes back after treatment. Because bacterial vaginosis increases your risk for sexually transmitted diseases, getting treated can help reduce your risk for chlamydia, gonorrhea, HIV, and herpes. HOME CARE INSTRUCTIONS   Only take over-the-counter or prescription medicines as directed by your health care provider.  If antibiotic medicine was prescribed, take it as directed. Make sure you finish it even if you start to feel better.  Tell all sexual partners that you have a vaginal infection. They should see their health care provider and be treated if they have problems, such as a mild rash or itching.  During treatment, it is important that you follow these instructions:  Avoid sexual activity or use condoms correctly.  Do not douche.  Avoid alcohol as directed by your health care provider.  Avoid breastfeeding as directed by your health care provider. SEEK MEDICAL CARE IF:   Your symptoms are not improving after 3 days of treatment.  You have increased discharge or pain.  You have a fever. MAKE SURE YOU:   Understand these instructions.  Will watch your condition.  Will get help right away if you are not doing well or get worse. FOR MORE INFORMATION  Centers for Disease Control and Prevention, Division of STD Prevention: SolutionApps.co.zawww.cdc.gov/std American Sexual Health Association (ASHA): www.ashastd.org    This information is not intended to replace advice given to you by your health care provider. Make sure you discuss any questions you have with your health care provider.   Document Released: 03/30/2005 Document Revised: 04/20/2014 Document Reviewed: 11/09/2012  Elsevier Interactive Patient Education ©2016 Elsevier Inc. ° °

## 2015-08-05 LAB — GC/CHLAMYDIA PROBE AMP (~~LOC~~) NOT AT ARMC
CHLAMYDIA, DNA PROBE: NEGATIVE
Neisseria Gonorrhea: NEGATIVE

## 2015-10-01 ENCOUNTER — Encounter (HOSPITAL_COMMUNITY): Payer: Self-pay | Admitting: *Deleted

## 2015-10-01 ENCOUNTER — Emergency Department (HOSPITAL_COMMUNITY)
Admission: EM | Admit: 2015-10-01 | Discharge: 2015-10-01 | Disposition: A | Discharge status: Home / Self Care | Payer: Medicaid Other | Attending: Emergency Medicine | Admitting: Emergency Medicine

## 2015-10-01 DIAGNOSIS — Z5321 Procedure and treatment not carried out due to patient leaving prior to being seen by health care provider: Secondary | ICD-10-CM | POA: Diagnosis not present

## 2015-10-01 DIAGNOSIS — Z87891 Personal history of nicotine dependence: Secondary | ICD-10-CM | POA: Insufficient documentation

## 2015-10-01 DIAGNOSIS — J45909 Unspecified asthma, uncomplicated: Secondary | ICD-10-CM | POA: Insufficient documentation

## 2015-10-01 DIAGNOSIS — N898 Other specified noninflammatory disorders of vagina: Secondary | ICD-10-CM | POA: Diagnosis present

## 2015-10-01 LAB — URINALYSIS, ROUTINE W REFLEX MICROSCOPIC
Bilirubin Urine: NEGATIVE
Glucose, UA: NEGATIVE mg/dL
Hgb urine dipstick: NEGATIVE
KETONES UR: NEGATIVE mg/dL
NITRITE: POSITIVE — AB
PROTEIN: NEGATIVE mg/dL
Specific Gravity, Urine: 1.026 (ref 1.005–1.030)
pH: 7 (ref 5.0–8.0)

## 2015-10-01 LAB — URINE MICROSCOPIC-ADD ON

## 2015-10-01 LAB — POC URINE PREG, ED: PREG TEST UR: NEGATIVE

## 2015-10-01 NOTE — ED Notes (Signed)
Pt thinks she has bacterial vaginosis and never finished prescription and now with white thick discharge with odor.  No pain

## 2015-10-01 NOTE — ED Notes (Signed)
Pt called 3X to revitalize. No answer.

## 2015-10-01 NOTE — ED Provider Notes (Signed)
Patient left without being seen  1. Patient left without being seen      Melene Planan Oddie Kuhlmann, DO 10/01/15 1821

## 2015-11-04 ENCOUNTER — Inpatient Hospital Stay (HOSPITAL_COMMUNITY): Payer: Medicaid Other

## 2015-11-04 ENCOUNTER — Encounter (HOSPITAL_COMMUNITY): Payer: Self-pay | Admitting: *Deleted

## 2015-11-04 ENCOUNTER — Inpatient Hospital Stay (HOSPITAL_COMMUNITY)
Admission: AD | Admit: 2015-11-04 | Discharge: 2015-11-04 | Disposition: A | Discharge status: Home / Self Care | Payer: Medicaid Other | Source: Ambulatory Visit | Attending: Family Medicine | Admitting: Family Medicine

## 2015-11-04 DIAGNOSIS — J45909 Unspecified asthma, uncomplicated: Secondary | ICD-10-CM | POA: Insufficient documentation

## 2015-11-04 DIAGNOSIS — Z8349 Family history of other endocrine, nutritional and metabolic diseases: Secondary | ICD-10-CM | POA: Diagnosis not present

## 2015-11-04 DIAGNOSIS — O99511 Diseases of the respiratory system complicating pregnancy, first trimester: Secondary | ICD-10-CM | POA: Diagnosis not present

## 2015-11-04 DIAGNOSIS — R109 Unspecified abdominal pain: Secondary | ICD-10-CM | POA: Diagnosis present

## 2015-11-04 DIAGNOSIS — Z825 Family history of asthma and other chronic lower respiratory diseases: Secondary | ICD-10-CM | POA: Insufficient documentation

## 2015-11-04 DIAGNOSIS — O209 Hemorrhage in early pregnancy, unspecified: Secondary | ICD-10-CM | POA: Diagnosis not present

## 2015-11-04 DIAGNOSIS — Z8249 Family history of ischemic heart disease and other diseases of the circulatory system: Secondary | ICD-10-CM | POA: Insufficient documentation

## 2015-11-04 DIAGNOSIS — O26891 Other specified pregnancy related conditions, first trimester: Secondary | ICD-10-CM | POA: Diagnosis not present

## 2015-11-04 DIAGNOSIS — Z79899 Other long term (current) drug therapy: Secondary | ICD-10-CM | POA: Diagnosis not present

## 2015-11-04 DIAGNOSIS — O99331 Smoking (tobacco) complicating pregnancy, first trimester: Secondary | ICD-10-CM | POA: Diagnosis not present

## 2015-11-04 DIAGNOSIS — Z3A09 9 weeks gestation of pregnancy: Secondary | ICD-10-CM | POA: Insufficient documentation

## 2015-11-04 DIAGNOSIS — O26899 Other specified pregnancy related conditions, unspecified trimester: Secondary | ICD-10-CM

## 2015-11-04 DIAGNOSIS — R102 Pelvic and perineal pain: Secondary | ICD-10-CM | POA: Insufficient documentation

## 2015-11-04 DIAGNOSIS — F17210 Nicotine dependence, cigarettes, uncomplicated: Secondary | ICD-10-CM | POA: Diagnosis not present

## 2015-11-04 LAB — URINALYSIS, ROUTINE W REFLEX MICROSCOPIC
BILIRUBIN URINE: NEGATIVE
Glucose, UA: NEGATIVE mg/dL
HGB URINE DIPSTICK: NEGATIVE
Ketones, ur: NEGATIVE mg/dL
Leukocytes, UA: NEGATIVE
NITRITE: NEGATIVE
PROTEIN: NEGATIVE mg/dL
SPECIFIC GRAVITY, URINE: 1.025 (ref 1.005–1.030)
pH: 5.5 (ref 5.0–8.0)

## 2015-11-04 LAB — CBC
HCT: 38 % (ref 36.0–46.0)
Hemoglobin: 13.4 g/dL (ref 12.0–15.0)
MCH: 32.8 pg (ref 26.0–34.0)
MCHC: 35.3 g/dL (ref 30.0–36.0)
MCV: 92.9 fL (ref 78.0–100.0)
PLATELETS: 304 10*3/uL (ref 150–400)
RBC: 4.09 MIL/uL (ref 3.87–5.11)
RDW: 13.2 % (ref 11.5–15.5)
WBC: 9.3 10*3/uL (ref 4.0–10.5)

## 2015-11-04 LAB — HCG, QUANTITATIVE, PREGNANCY: HCG, BETA CHAIN, QUANT, S: 4615 m[IU]/mL — AB (ref <5)

## 2015-11-04 LAB — OB RESULTS CONSOLE ABO/RH: RH Type: POSITIVE

## 2015-11-04 LAB — POCT PREGNANCY, URINE: PREG TEST UR: POSITIVE — AB

## 2015-11-04 NOTE — Discharge Instructions (Signed)

## 2015-11-04 NOTE — MAU Provider Note (Signed)
History     CSN: [id number]  Arrival date and time: 11/04/15 1020   First Provider Initiated Contact with Patient 11/04/15 1135      Chief Complaint  Patient presents with  . Abdominal Cramping  . Vaginal Bleeding   HPI    Patient is a 30 year old [id number] @ [redacted]w[redacted]d presenting to the MAU with vaginal bleeding two days ago followed by  abdominal cramping. She describes the bleeding as light spotting that is light pink to dark brown in color.   She describes the lower abdominal cramping as continuous pain that she rates at a 6-7 out of 10. No relief from positional changes. Has not tried anything for pain. . Denies dysuria, hematuria, increased frequency, vaginal discharge, dyspareunia, and no changes in bowel movements.   LMP May 22nd. [facility] 7 weeks and 4 days. Blood work and vaginal cultures were obtained but no ultrasound was performed. Takes prenatal vitamins daily.  Treated for bacterial vaginosis.   Has history of chlamydia and gonorrhea but denies prior ectopic pregnancy. Two terminations (one in 2005 and one in January 2017 at [facility])  Dad on phone and best friend in room.    Past Medical History:  Diagnosis Date  . Abnormal Pap smear   . Asthma   . Hx of chlamydia infection     Past Surgical History:  Procedure Laterality Date  . COLPOSCOPY      Family History  Problem Relation Age of Onset  . Asthma Daughter   . Hypertension Mother   . Thyroid disease Mother   . Hypertension Maternal Aunt   . Hypertension Maternal Grandmother   . Thyroid disease Maternal Grandmother     Social History  Substance Use Topics  . Smoking status: Current Every Day Smoker    Packs/day: 0.50    Types: Cigarettes  . Smokeless tobacco: Never Used  . Alcohol use Yes     Comment: occasionally    Allergies:  Allergies  Allergen Reactions  . Latex Other (See Comments)    Bacterial or yeast infection from condoms    Prescriptions  Prior to Admission  Medication Sig Dispense Refill Last Dose  . albuterol (PROVENTIL HFA;VENTOLIN HFA) 108 (90 BASE) MCG/ACT inhaler Inhale 2 puffs into the lungs every 4 (four) hours as needed for wheezing or shortness of breath. 1 Inhaler 0   . guaiFENesin (MUCINEX) 600 MG 12 hr tablet Take 1,200 mg by mouth 2 (two) times daily.   02/24/2015 at Unknown time  . ibuprofen (ADVIL,MOTRIN) 200 MG tablet Take 400 mg by mouth every 6 (six) hours as needed for mild pain.    02/23/2015 at Unknown time  . metroNIDAZOLE (FLAGYL) 500 MG tablet Take 1 tablet (500 mg total) by mouth 2 (two) times daily. 14 tablet 0   . predniSONE (DELTASONE) 20 MG tablet Take 2 tablets (40 mg total) by mouth daily. 10 tablet 0     Review of Systems  Constitutional: Negative.   HENT: Negative.   Respiratory: Negative.   Cardiovascular: Negative.   Gastrointestinal: Positive for abdominal pain (Lower), nausea and vomiting.  Genitourinary: Negative for dysuria, frequency and urgency.       Vaginal bleeding  Skin: Negative.    Other systems negative  Physical Exam   Blood pressure 135/79, pulse 77, temperature 98.7 F (37.1 C), temperature source Oral, resp. rate 18, weight 73.4 kg (161 lb 12 oz), last menstrual period 09/02/2015, unknown if currently breastfeeding.  Physical Exam  Constitutional: She is oriented to person, place, and time. She appears well-developed and well-nourished.  HENT:  Head: Normocephalic.  Eyes: No scleral icterus.  Cardiovascular: Normal rate and regular rhythm.   Respiratory: Effort normal and breath sounds normal.  GI: Soft. Bowel sounds are normal. She exhibits no distension. There is tenderness in the right lower quadrant, suprapubic area and left lower quadrant. There is no rebound and no guarding.  Neurological: She is alert and oriented to person, place, and time.  Skin: Skin is warm and dry.   Pelvic exam deferred due to recent exam at [facility] Recently txed for BV  MAU Course   Procedures  None  Results for orders placed or performed during the hospital encounter of 11/04/15 (from the past 24 hour(s))  Urinalysis, Routine w reflex microscopic (not at [facility])     Status: Abnormal   Collection Time: 11/04/15 10:45 AM  Result Value Ref Range   Color, Urine YELLOW YELLOW   APPearance HAZY (A) CLEAR   Specific Gravity, Urine 1.025 1.005 - 1.030   pH 5.5 5.0 - 8.0   Glucose, UA NEGATIVE NEGATIVE mg/dL   Hgb urine dipstick NEGATIVE NEGATIVE   Bilirubin Urine NEGATIVE NEGATIVE   Ketones, ur NEGATIVE NEGATIVE mg/dL   Protein, ur NEGATIVE NEGATIVE mg/dL   Nitrite NEGATIVE NEGATIVE   Leukocytes, UA NEGATIVE NEGATIVE  Pregnancy, urine POC     Status: Abnormal   Collection Time: 11/04/15 10:50 AM  Result Value Ref Range   Preg Test, Ur POSITIVE (A) NEGATIVE  CBC     Status: None   Collection Time: 11/04/15 11:42 AM  Result Value Ref Range   WBC 9.3 4.0 - 10.5 K/uL   RBC 4.09 3.87 - 5.11 MIL/uL   Hemoglobin 13.4 12.0 - 15.0 g/dL   HCT 14.7 82.9 - 56.2 %   MCV 92.9 78.0 - 100.0 fL   MCH 32.8 26.0 - 34.0 pg   MCHC 35.3 30.0 - 36.0 g/dL   RDW 13.0 86.5 - 78.4 %   Platelets 304 150 - 400 K/uL  hCG, quantitative, pregnancy     Status: Abnormal   Collection Time: 11/04/15 11:42 AM  Result Value Ref Range   hCG, Beta Chain, Quant, S 4,615 (H) <5 mIU/mL   US OB Complete Less than 14 Weeks Results: Both transabdominal and transvaginal ultrasound examinations were performed for complete evaluation of the gestation as well as the maternal uterus, adnexal regions, and pelvic cul-de-sac. Transvaginal technique was performed to assess early pregnancy. COMPARISON:  None. FINDINGS: Intrauterine gestational sac: Single Yolk sac:  Visualized Embryo:  None visualized MSD: 8  mm   5 w   4  d Subchorionic hemorrhage:  Small subchorionic hemorrhage noted. Maternal uterus/adnexae: Right ovarian corpus luteum cyst noted. Normal appearance of left ovary. No mass or free fluid  identified. IMPRESSION: Single intrauterine gestational sac with estimated gestational age of [redacted] weeks 4 days by mean sac diameter. This is 3.5 weeks behind menstrual dates. Recommend followup of quantitative beta HCG levels, and consider followup ultrasound to assess viability in 10-14 days. Small subchorionic hemorrhage noted.  Transvaginal Ultrasound:  Transvaginal technique was performed to assess early pregnancy. COMPARISON:  None. FINDINGS: Intrauterine gestational sac: Single Yolk sac:  Visualized Embryo:  None visualized MSD: 8  mm   5 w   4  d Subchorionic hemorrhage:  Small subchorionic hemorrhage noted. Maternal uterus/adnexae: Right ovarian corpus luteum cyst noted. Normal appearance of left ovary. No mass or free  fluid identified. IMPRESSION: Single intrauterine gestational sac with estimated gestational age of [redacted] weeks 4 days by mean sac diameter. This is 3.5 weeks behind menstrual dates. Recommend followup of quantitative beta HCG levels, and consider followup ultrasound to assess viability in 10-14 days. Small subchorionic hemorrhage noted.   MDM UA ordered to rule out UTI.  CBC ordered to rule out infection. Transabdominal and transvaginal ultrasound performed to rule out ectopic pregnancy.   Once gestational and yolk sac visualized, along with a subchorionic hemorrhage, determined that intrauterine Pregnancy has been established (effectively rules out Ectopic) and bleeding is likely from subchorionic hemorrhage.  Uterine cramping is secondary to bleeding.    Assessment and Plan  30 year old [id number] @ [redacted]w[redacted]d presenting to the MAU with vaginal bleeding and lower abdominal pain.  It is determined that patient has bleeding possibly likely secondary to subchorionic hemorrhage and uterine cramping is secondary to bleeding   Patient to follow-up with OB in two weeks for ultrasound to confirm viability.    Patient to return to emergency department if bleeding becomes  heavy like a menstrual cycle, abdominal cramping worsens.    Clyde Canterbury 11/04/2015, 1:28 PM

## 2015-11-04 NOTE — MAU Note (Signed)
Having some cramping, started this morning.  Had reddish spotting yesterday. None today.

## 2015-11-05 LAB — HIV ANTIBODY (ROUTINE TESTING W REFLEX): HIV Screen 4th Generation wRfx: NONREACTIVE

## 2015-11-09 ENCOUNTER — Encounter (HOSPITAL_COMMUNITY): Payer: Self-pay | Admitting: *Deleted

## 2015-11-09 ENCOUNTER — Emergency Department (HOSPITAL_COMMUNITY): Payer: Medicaid Other

## 2015-11-09 ENCOUNTER — Emergency Department (HOSPITAL_COMMUNITY)
Admission: EM | Admit: 2015-11-09 | Discharge: 2015-11-09 | Disposition: A | Discharge status: Home / Self Care | Payer: Medicaid Other | Attending: Emergency Medicine | Admitting: Emergency Medicine

## 2015-11-09 DIAGNOSIS — S0093XA Contusion of unspecified part of head, initial encounter: Secondary | ICD-10-CM | POA: Diagnosis not present

## 2015-11-09 DIAGNOSIS — O9A211 Injury, poisoning and certain other consequences of external causes complicating pregnancy, first trimester: Secondary | ICD-10-CM | POA: Insufficient documentation

## 2015-11-09 DIAGNOSIS — J45909 Unspecified asthma, uncomplicated: Secondary | ICD-10-CM | POA: Insufficient documentation

## 2015-11-09 DIAGNOSIS — Y939 Activity, unspecified: Secondary | ICD-10-CM | POA: Diagnosis not present

## 2015-11-09 DIAGNOSIS — S00412A Abrasion of left ear, initial encounter: Secondary | ICD-10-CM | POA: Diagnosis not present

## 2015-11-09 DIAGNOSIS — O99331 Smoking (tobacco) complicating pregnancy, first trimester: Secondary | ICD-10-CM | POA: Insufficient documentation

## 2015-11-09 DIAGNOSIS — S0990XA Unspecified injury of head, initial encounter: Secondary | ICD-10-CM | POA: Insufficient documentation

## 2015-11-09 DIAGNOSIS — Z3A01 Less than 8 weeks gestation of pregnancy: Secondary | ICD-10-CM | POA: Diagnosis not present

## 2015-11-09 DIAGNOSIS — F17210 Nicotine dependence, cigarettes, uncomplicated: Secondary | ICD-10-CM | POA: Diagnosis not present

## 2015-11-09 DIAGNOSIS — Y92009 Unspecified place in unspecified non-institutional (private) residence as the place of occurrence of the external cause: Secondary | ICD-10-CM | POA: Diagnosis not present

## 2015-11-09 DIAGNOSIS — S0011XA Contusion of right eyelid and periocular area, initial encounter: Secondary | ICD-10-CM | POA: Insufficient documentation

## 2015-11-09 DIAGNOSIS — T148XXA Other injury of unspecified body region, initial encounter: Secondary | ICD-10-CM

## 2015-11-09 DIAGNOSIS — Y999 Unspecified external cause status: Secondary | ICD-10-CM | POA: Diagnosis not present
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
## 2015-11-09 NOTE — Discharge Instructions (Signed)
Apply ice to the area. Use Tylenol as needed for pain. Follow-up with OB/GYN.

## 2015-11-17 ENCOUNTER — Emergency Department (HOSPITAL_COMMUNITY)
Admission: EM | Admit: 2015-11-17 | Discharge: 2015-11-18 | Disposition: A | Discharge status: Home / Self Care | Payer: Medicaid Other | Attending: Emergency Medicine | Admitting: Emergency Medicine

## 2015-11-17 ENCOUNTER — Encounter (HOSPITAL_COMMUNITY): Payer: Self-pay

## 2015-11-17 DIAGNOSIS — O209 Hemorrhage in early pregnancy, unspecified: Secondary | ICD-10-CM | POA: Insufficient documentation

## 2015-11-17 DIAGNOSIS — F17210 Nicotine dependence, cigarettes, uncomplicated: Secondary | ICD-10-CM | POA: Diagnosis not present

## 2015-11-17 DIAGNOSIS — N898 Other specified noninflammatory disorders of vagina: Secondary | ICD-10-CM | POA: Insufficient documentation

## 2015-11-17 DIAGNOSIS — B9689 Other specified bacterial agents as the cause of diseases classified elsewhere: Secondary | ICD-10-CM

## 2015-11-17 DIAGNOSIS — J45909 Unspecified asthma, uncomplicated: Secondary | ICD-10-CM | POA: Diagnosis not present

## 2015-11-17 DIAGNOSIS — N760 Acute vaginitis: Secondary | ICD-10-CM

## 2015-11-17 LAB — ABO/RH: ABO/RH(D): O POS

## 2015-11-17 LAB — URINALYSIS, ROUTINE W REFLEX MICROSCOPIC
BILIRUBIN URINE: NEGATIVE
Glucose, UA: NEGATIVE mg/dL
HGB URINE DIPSTICK: NEGATIVE
KETONES UR: 15 mg/dL — AB
Leukocytes, UA: NEGATIVE
NITRITE: NEGATIVE
PH: 6 (ref 5.0–8.0)
PROTEIN: NEGATIVE mg/dL
SPECIFIC GRAVITY, URINE: 1.029 (ref 1.005–1.030)

## 2015-11-17 LAB — HCG, QUANTITATIVE, PREGNANCY: hCG, Beta Chain, Quant, S: 106457 m[IU]/mL — ABNORMAL HIGH (ref <5)

## 2015-11-17 LAB — I-STAT BETA HCG BLOOD, ED (MC, WL, AP ONLY)

## 2015-11-17 LAB — OB RESULTS CONSOLE GC/CHLAMYDIA: Gonorrhea: NEGATIVE

## 2015-11-17 LAB — WET PREP, GENITAL
SPERM: NONE SEEN
Trich, Wet Prep: NONE SEEN
YEAST WET PREP: NONE SEEN

## 2015-11-17 MED ORDER — METRONIDAZOLE 500 MG PO TABS
500.0000 mg | ORAL_TABLET | Freq: Once | ORAL | Status: AC
Start: 2015-11-18 — End: 2015-11-18
  Administered 2015-11-18: 500 mg via ORAL
  Filled 2015-11-17: qty 1

## 2015-11-17 NOTE — ED Triage Notes (Signed)
Pt states she is 11 weeks preg. And started having spoting today; pt states some yellowish discharge noted when she wipes; Pt denies pain on arrival. Pt a&ox 4 on arrival.
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
## 2015-11-18 ENCOUNTER — Encounter: Payer: Self-pay | Admitting: Obstetrics & Gynecology

## 2015-11-18 ENCOUNTER — Ambulatory Visit (HOSPITAL_COMMUNITY)
Admission: RE | Admit: 2015-11-18 | Discharge: 2015-11-18 | Disposition: A | Discharge status: Home / Self Care | Payer: Medicaid Other | Source: Ambulatory Visit | Attending: Advanced Practice Midwife | Admitting: Advanced Practice Midwife

## 2015-11-18 ENCOUNTER — Ambulatory Visit (INDEPENDENT_AMBULATORY_CARE_PROVIDER_SITE_OTHER): Payer: Medicaid Other | Admitting: Obstetrics & Gynecology

## 2015-11-18 ENCOUNTER — Telehealth: Payer: Self-pay | Admitting: *Deleted

## 2015-11-18 DIAGNOSIS — Z3491 Encounter for supervision of normal pregnancy, unspecified, first trimester: Secondary | ICD-10-CM

## 2015-11-18 DIAGNOSIS — O209 Hemorrhage in early pregnancy, unspecified: Secondary | ICD-10-CM | POA: Diagnosis present

## 2015-11-18 DIAGNOSIS — Z3A01 Less than 8 weeks gestation of pregnancy: Secondary | ICD-10-CM | POA: Diagnosis not present

## 2015-11-18 LAB — GC/CHLAMYDIA PROBE AMP (~~LOC~~) NOT AT ARMC
Chlamydia: NEGATIVE
Neisseria Gonorrhea: NEGATIVE

## 2015-11-18 MED ORDER — METRONIDAZOLE 500 MG PO TABS: 500.0000 mg | ORAL_TABLET | Freq: Two times a day (BID) | ORAL | 0 refills | Status: DC
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
## 2015-11-18 NOTE — Patient Instructions (Signed)
First Trimester of Pregnancy The first trimester of pregnancy is from week 1 until the end of week 12 (months 1 through 3). A week after a sperm fertilizes an egg, the egg will implant on the wall of the uterus. This embryo will begin to develop into a baby. Genes from you and your partner are forming the baby. The female genes determine whether the baby is a boy or a girl. At 6-8 weeks, the eyes and face are formed, and the heartbeat can be seen on ultrasound. At the end of 12 weeks, all the baby's organs are formed.  Now that you are pregnant, you will want to do everything you can to have a healthy baby. Two of the most important things are to get good prenatal care and to follow your health care provider's instructions. Prenatal care is all the medical care you receive before the baby's birth. This care will help prevent, find, and treat any problems during the pregnancy and childbirth. BODY CHANGES Your body goes through many changes during pregnancy. The changes vary from woman to woman.   You may gain or lose a couple of pounds at first.  You may feel sick to your stomach (nauseous) and throw up (vomit). If the vomiting is uncontrollable, call your health care provider.  You may tire easily.  You may develop headaches that can be relieved by medicines approved by your health care provider.  You may urinate more often. Painful urination may mean you have a bladder infection.  You may develop heartburn as a result of your pregnancy.  You may develop constipation because certain hormones are causing the muscles that push waste through your intestines to slow down.  You may develop hemorrhoids or swollen, bulging veins (varicose veins).  Your breasts may begin to grow larger and become tender. Your nipples may stick out more, and the tissue that surrounds them (areola) may become darker.  Your gums may bleed and may be sensitive to brushing and flossing.  Dark spots or blotches (chloasma,  mask of pregnancy) may develop on your face. This will likely fade after the baby is born.  Your menstrual periods will stop.  You may have a loss of appetite.  You may develop cravings for certain kinds of food.  You may have changes in your emotions from day to day, such as being excited to be pregnant or being concerned that something may go wrong with the pregnancy and baby.  You may have more vivid and strange dreams.  You may have changes in your hair. These can include thickening of your hair, rapid growth, and changes in texture. Some women also have hair loss during or after pregnancy, or hair that feels dry or thin. Your hair will most likely return to normal after your baby is born. WHAT TO EXPECT AT YOUR PRENATAL VISITS During a routine prenatal visit:  You will be weighed to make sure you and the baby are growing normally.  Your blood pressure will be taken.  Your abdomen will be measured to track your baby's growth.  The fetal heartbeat will be listened to starting around week 10 or 12 of your pregnancy.  Test results from any previous visits will be discussed. Your health care provider may ask you:  How you are feeling.  If you are feeling the baby move.  If you have had any abnormal symptoms, such as leaking fluid, bleeding, severe headaches, or abdominal cramping.  If you are using any tobacco products,   including cigarettes, chewing tobacco, and electronic cigarettes.  If you have any questions. Other tests that may be performed during your first trimester include:  Blood tests to find your blood type and to check for the presence of any previous infections. They will also be used to check for low iron levels (anemia) and Rh antibodies. Later in the pregnancy, blood tests for diabetes will be done along with other tests if problems develop.  Urine tests to check for infections, diabetes, or protein in the urine.  An ultrasound to confirm the proper growth  and development of the baby.  An amniocentesis to check for possible genetic problems.  Fetal screens for spina bifida and Down syndrome.  You may need other tests to make sure you and the baby are doing well.  HIV (human immunodeficiency virus) testing. Routine prenatal testing includes screening for HIV, unless you choose not to have this test. HOME CARE INSTRUCTIONS  Medicines  Follow your health care provider's instructions regarding medicine use. Specific medicines may be either safe or unsafe to take during pregnancy.  Take your prenatal vitamins as directed.  If you develop constipation, try taking a stool softener if your health care provider approves. Diet  Eat regular, well-balanced meals. Choose a variety of foods, such as meat or vegetable-based protein, fish, milk and low-fat dairy products, vegetables, fruits, and whole grain breads and cereals. Your health care provider will help you determine the amount of weight gain that is right for you.  Avoid raw meat and uncooked cheese. These carry germs that can cause birth defects in the baby.  Eating four or five small meals rather than three large meals a day may help relieve nausea and vomiting. If you start to feel nauseous, eating a few soda crackers can be helpful. Drinking liquids between meals instead of during meals also seems to help nausea and vomiting.  If you develop constipation, eat more high-fiber foods, such as fresh vegetables or fruit and whole grains. Drink enough fluids to keep your urine clear or pale yellow. Activity and Exercise  Exercise only as directed by your health care provider. Exercising will help you:  Control your weight.  Stay in shape.  Be prepared for labor and delivery.  Experiencing pain or cramping in the lower abdomen or low back is a good sign that you should stop exercising. Check with your health care provider before continuing normal exercises.  Try to avoid standing for long  periods of time. Move your legs often if you must stand in one place for a long time.  Avoid heavy lifting.  Wear low-heeled shoes, and practice good posture.  You may continue to have sex unless your health care provider directs you otherwise. Relief of Pain or Discomfort  Wear a good support bra for breast tenderness.   Take warm sitz baths to soothe any pain or discomfort caused by hemorrhoids. Use hemorrhoid cream if your health care provider approves.   Rest with your legs elevated if you have leg cramps or low back pain.  If you develop varicose veins in your legs, wear support hose. Elevate your feet for 15 minutes, 3-4 times a day. Limit salt in your diet. Prenatal Care  Schedule your prenatal visits by the twelfth week of pregnancy. They are usually scheduled monthly at first, then more often in the last 2 months before delivery.  Write down your questions. Take them to your prenatal visits.  Keep all your prenatal visits as directed by your   health care provider. Safety  Wear your seat belt at all times when driving.  Make a list of emergency phone numbers, including numbers for family, friends, the hospital, and police and fire departments. General Tips  Ask your health care provider for a referral to a local prenatal education class. Begin classes no later than at the beginning of month 6 of your pregnancy.  Ask for help if you have counseling or nutritional needs during pregnancy. Your health care provider can offer advice or refer you to specialists for help with various needs.  Do not use hot tubs, steam rooms, or saunas.  Do not douche or use tampons or scented sanitary pads.  Do not cross your legs for long periods of time.  Avoid cat litter boxes and soil used by cats. These carry germs that can cause birth defects in the baby and possibly loss of the fetus by miscarriage or stillbirth.  Avoid all smoking, herbs, alcohol, and medicines not prescribed by  your health care provider. Chemicals in these affect the formation and growth of the baby.  Do not use any tobacco products, including cigarettes, chewing tobacco, and electronic cigarettes. If you need help quitting, ask your health care provider. You may receive counseling support and other resources to help you quit.  Schedule a dentist appointment. At home, brush your teeth with a soft toothbrush and be gentle when you floss. SEEK MEDICAL CARE IF:   You have dizziness.  You have mild pelvic cramps, pelvic pressure, or nagging pain in the abdominal area.  You have persistent nausea, vomiting, or diarrhea.  You have a bad smelling vaginal discharge.  You have pain with urination.  You notice increased swelling in your face, hands, legs, or ankles. SEEK IMMEDIATE MEDICAL CARE IF:   You have a fever.  You are leaking fluid from your vagina.  You have spotting or bleeding from your vagina.  You have severe abdominal cramping or pain.  You have rapid weight gain or loss.  You vomit blood or material that looks like coffee grounds.  You are exposed to German measles and have never had them.  You are exposed to fifth disease or chickenpox.  You develop a severe headache.  You have shortness of breath.  You have any kind of trauma, such as from a fall or a car accident.   This information is not intended to replace advice given to you by your health care provider. Make sure you discuss any questions you have with your health care provider.   Document Released: 03/24/2001 Document Revised: 04/20/2014 Document Reviewed: 02/07/2013 Elsevier Interactive Patient Education 2016 [employer]  

## 2015-11-18 NOTE — Telephone Encounter (Signed)
Called patient to notify her of her ultrasound appointment. Scheduled 8/21 @1045 . Patient voiced understanding.

## 2015-11-18 NOTE — Progress Notes (Deleted)
.  cwh 

## 2015-11-22 ENCOUNTER — Encounter (HOSPITAL_COMMUNITY): Payer: Self-pay | Admitting: Obstetrics & Gynecology

## 2015-11-24 ENCOUNTER — Encounter: Payer: Self-pay | Admitting: Advanced Practice Midwife

## 2015-11-24 DIAGNOSIS — IMO0002 Reserved for concepts with insufficient information to code with codable children: Secondary | ICD-10-CM | POA: Insufficient documentation

## 2015-11-28 ENCOUNTER — Encounter: Payer: Self-pay | Admitting: Family Medicine

## 2015-12-02 ENCOUNTER — Encounter (HOSPITAL_COMMUNITY): Payer: Self-pay

## 2015-12-02 ENCOUNTER — Ambulatory Visit (HOSPITAL_COMMUNITY)
Admission: RE | Admit: 2015-12-02 | Discharge: 2015-12-02 | Disposition: A | Discharge status: Home / Self Care | Payer: Medicaid Other | Source: Ambulatory Visit | Attending: Obstetrics & Gynecology | Admitting: Obstetrics & Gynecology

## 2015-12-02 ENCOUNTER — Ambulatory Visit (HOSPITAL_COMMUNITY): Admission: RE | Admit: 2015-12-02 | Payer: Medicaid Other | Source: Ambulatory Visit

## 2015-12-02 ENCOUNTER — Other Ambulatory Visit: Payer: Self-pay | Admitting: Obstetrics & Gynecology

## 2015-12-02 DIAGNOSIS — Z3491 Encounter for supervision of normal pregnancy, unspecified, first trimester: Secondary | ICD-10-CM

## 2015-12-02 DIAGNOSIS — Z3682 Encounter for antenatal screening for nuchal translucency: Secondary | ICD-10-CM

## 2015-12-03 ENCOUNTER — Other Ambulatory Visit (HOSPITAL_COMMUNITY): Payer: Self-pay | Admitting: *Deleted

## 2015-12-03 DIAGNOSIS — Z3682 Encounter for antenatal screening for nuchal translucency: Secondary | ICD-10-CM

## 2015-12-17 ENCOUNTER — Encounter: Payer: Medicaid Other | Admitting: Family Medicine

## 2015-12-17 ENCOUNTER — Encounter: Payer: Self-pay | Admitting: Family Medicine

## 2015-12-26 ENCOUNTER — Ambulatory Visit (HOSPITAL_COMMUNITY)
Admission: RE | Admit: 2015-12-26 | Discharge: 2015-12-26 | Disposition: A | Discharge status: Home / Self Care | Payer: Medicaid Other | Source: Ambulatory Visit | Attending: Obstetrics & Gynecology | Admitting: Obstetrics & Gynecology

## 2015-12-26 ENCOUNTER — Encounter (HOSPITAL_COMMUNITY): Payer: Self-pay

## 2015-12-26 DIAGNOSIS — O9989 Other specified diseases and conditions complicating pregnancy, childbirth and the puerperium: Secondary | ICD-10-CM | POA: Insufficient documentation

## 2015-12-26 DIAGNOSIS — O3429 Maternal care due to uterine scar from other previous surgery: Secondary | ICD-10-CM | POA: Insufficient documentation

## 2015-12-26 DIAGNOSIS — Z3A12 12 weeks gestation of pregnancy: Secondary | ICD-10-CM | POA: Insufficient documentation

## 2015-12-26 DIAGNOSIS — J45909 Unspecified asthma, uncomplicated: Secondary | ICD-10-CM | POA: Insufficient documentation

## 2015-12-26 DIAGNOSIS — Z36 Encounter for antenatal screening of mother: Secondary | ICD-10-CM | POA: Diagnosis not present

## 2015-12-26 DIAGNOSIS — O09291 Supervision of pregnancy with other poor reproductive or obstetric history, first trimester: Secondary | ICD-10-CM | POA: Diagnosis not present

## 2015-12-26 DIAGNOSIS — Z3682 Encounter for antenatal screening for nuchal translucency: Secondary | ICD-10-CM

## 2015-12-30 ENCOUNTER — Other Ambulatory Visit (HOSPITAL_COMMUNITY): Payer: Self-pay

## 2016-01-13 ENCOUNTER — Encounter: Payer: Self-pay | Admitting: Family Medicine

## 2016-01-13 ENCOUNTER — Ambulatory Visit (INDEPENDENT_AMBULATORY_CARE_PROVIDER_SITE_OTHER): Payer: Medicaid Other | Admitting: Family Medicine

## 2016-01-13 VITALS — BP 111/61 | HR 75 | Wt 164.9 lb

## 2016-01-13 DIAGNOSIS — F17200 Nicotine dependence, unspecified, uncomplicated: Secondary | ICD-10-CM

## 2016-01-13 DIAGNOSIS — O99332 Smoking (tobacco) complicating pregnancy, second trimester: Secondary | ICD-10-CM

## 2016-01-13 DIAGNOSIS — J45909 Unspecified asthma, uncomplicated: Secondary | ICD-10-CM

## 2016-01-13 DIAGNOSIS — O09292 Supervision of pregnancy with other poor reproductive or obstetric history, second trimester: Secondary | ICD-10-CM

## 2016-01-13 DIAGNOSIS — O99512 Diseases of the respiratory system complicating pregnancy, second trimester: Secondary | ICD-10-CM

## 2016-01-13 DIAGNOSIS — O9A312 Physical abuse complicating pregnancy, second trimester: Secondary | ICD-10-CM

## 2016-01-13 DIAGNOSIS — O09299 Supervision of pregnancy with other poor reproductive or obstetric history, unspecified trimester: Secondary | ICD-10-CM

## 2016-01-13 DIAGNOSIS — O0992 Supervision of high risk pregnancy, unspecified, second trimester: Secondary | ICD-10-CM

## 2016-01-13 DIAGNOSIS — IMO0002 Reserved for concepts with insufficient information to code with codable children: Secondary | ICD-10-CM

## 2016-01-13 DIAGNOSIS — Z8759 Personal history of other complications of pregnancy, childbirth and the puerperium: Secondary | ICD-10-CM

## 2016-01-13 DIAGNOSIS — O99519 Diseases of the respiratory system complicating pregnancy, unspecified trimester: Secondary | ICD-10-CM

## 2016-01-13 DIAGNOSIS — O1212 Gestational proteinuria, second trimester: Secondary | ICD-10-CM

## 2016-01-13 DIAGNOSIS — O0990 Supervision of high risk pregnancy, unspecified, unspecified trimester: Secondary | ICD-10-CM | POA: Insufficient documentation

## 2016-01-13 LAB — POCT URINALYSIS DIP (DEVICE)
Bilirubin Urine: NEGATIVE
Glucose, UA: NEGATIVE mg/dL
HGB URINE DIPSTICK: NEGATIVE
Ketones, ur: NEGATIVE mg/dL
Leukocytes, UA: NEGATIVE
NITRITE: NEGATIVE
PH: 6 (ref 5.0–8.0)
PROTEIN: 30 mg/dL — AB
Specific Gravity, Urine: >=1.03 (ref 1.005–1.030)
UROBILINOGEN UA: 1 mg/dL (ref 0.0–1.0)
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
## 2016-01-13 NOTE — Progress Notes (Signed)
Here for initial prenatal visit. Declines flu shot. Given prenatal education packet.

## 2016-01-13 NOTE — Patient Instructions (Signed)
Second Trimester of Pregnancy The second trimester is from week 13 through week 28, months 4 through 6. The second trimester is often a time when you feel your best. Your body has also adjusted to being pregnant, and you begin to feel better physically. Usually, morning sickness has lessened or quit completely, you may have more energy, and you may have an increase in appetite. The second trimester is also a time when the fetus is growing rapidly. At the end of the sixth month, the fetus is about 9 inches long and weighs about 1 pounds. You will likely begin to feel the baby move (quickening) between 18 and 20 weeks of the pregnancy. BODY CHANGES Your body goes through many changes during pregnancy. The changes vary from woman to woman.   Your weight will continue to increase. You will notice your lower abdomen bulging out.  You may begin to get stretch marks on your hips, abdomen, and breasts.  You may develop headaches that can be relieved by medicines approved by your health care provider.  You may urinate more often because the fetus is pressing on your bladder.  You may develop or continue to have heartburn as a result of your pregnancy.  You may develop constipation because certain hormones are causing the muscles that push waste through your intestines to slow down.  You may develop hemorrhoids or swollen, bulging veins (varicose veins).  You may have back pain because of the weight gain and pregnancy hormones relaxing your joints between the bones in your pelvis and as a result of a shift in weight and the muscles that support your balance.  Your breasts will continue to grow and be tender.  Your gums may bleed and may be sensitive to brushing and flossing.  Dark spots or blotches (chloasma, mask of pregnancy) may develop on your face. This will likely fade after the baby is born.  A dark line from your belly button to the pubic area (linea nigra) may appear. This will likely fade  after the baby is born.  You may have changes in your hair. These can include thickening of your hair, rapid growth, and changes in texture. Some women also have hair loss during or after pregnancy, or hair that feels dry or thin. Your hair will most likely return to normal after your baby is born. WHAT TO EXPECT AT YOUR PRENATAL VISITS During a routine prenatal visit:  You will be weighed to make sure you and the fetus are growing normally.  Your blood pressure will be taken.  Your abdomen will be measured to track your baby's growth.  The fetal heartbeat will be listened to.  Any test results from the previous visit will be discussed. Your health care provider may ask you:  How you are feeling.  If you are feeling the baby move.  If you have had any abnormal symptoms, such as leaking fluid, bleeding, severe headaches, or abdominal cramping.  If you are using any tobacco products, including cigarettes, chewing tobacco, and electronic cigarettes.  If you have any questions. Other tests that may be performed during your second trimester include:  Blood tests that check for:  Low iron levels (anemia).  Gestational diabetes (between 24 and 28 weeks).  Rh antibodies.  Urine tests to check for infections, diabetes, or protein in the urine.  An ultrasound to confirm the proper growth and development of the baby.  An amniocentesis to check for possible genetic problems.  Fetal screens for spina bifida   and Down syndrome.  HIV (human immunodeficiency virus) testing. Routine prenatal testing includes screening for HIV, unless you choose not to have this test. HOME CARE INSTRUCTIONS   Avoid all smoking, herbs, alcohol, and unprescribed drugs. These chemicals affect the formation and growth of the baby.  Do not use any tobacco products, including cigarettes, chewing tobacco, and electronic cigarettes. If you need help quitting, ask your health care provider. You may receive  counseling support and other resources to help you quit.  Follow your health care provider's instructions regarding medicine use. There are medicines that are either safe or unsafe to take during pregnancy.  Exercise only as directed by your health care provider. Experiencing uterine cramps is a good sign to stop exercising.  Continue to eat regular, healthy meals.  Wear a good support bra for breast tenderness.  Do not use hot tubs, steam rooms, or saunas.  Wear your seat belt at all times when driving.  Avoid raw meat, uncooked cheese, cat litter boxes, and soil used by cats. These carry germs that can cause birth defects in the baby.  Take your prenatal vitamins.  Take 1500-2000 mg of calcium daily starting at the 20th week of pregnancy until you deliver your baby.  Try taking a stool softener (if your health care provider approves) if you develop constipation. Eat more high-fiber foods, such as fresh vegetables or fruit and whole grains. Drink plenty of fluids to keep your urine clear or pale yellow.  Take warm sitz baths to soothe any pain or discomfort caused by hemorrhoids. Use hemorrhoid cream if your health care provider approves.  If you develop varicose veins, wear support hose. Elevate your feet for 15 minutes, 3-4 times a day. Limit salt in your diet.  Avoid heavy lifting, wear low heel shoes, and practice good posture.  Rest with your legs elevated if you have leg cramps or low back pain.  Visit your dentist if you have not gone yet during your pregnancy. Use a soft toothbrush to brush your teeth and be gentle when you floss.  A sexual relationship may be continued unless your health care provider directs you otherwise.  Continue to go to all your prenatal visits as directed by your health care provider. SEEK MEDICAL CARE IF:   You have dizziness.  You have mild pelvic cramps, pelvic pressure, or nagging pain in the abdominal area.  You have persistent nausea,  vomiting, or diarrhea.  You have a bad smelling vaginal discharge.  You have pain with urination. SEEK IMMEDIATE MEDICAL CARE IF:   You have a fever.  You are leaking fluid from your vagina.  You have spotting or bleeding from your vagina.  You have severe abdominal cramping or pain.  You have rapid weight gain or loss.  You have shortness of breath with chest pain.  You notice sudden or extreme swelling of your face, hands, ankles, feet, or legs.  You have not felt your baby move in over an hour.  You have severe headaches that do not go away with medicine.  You have vision changes.   This information is not intended to replace advice given to you by your health care provider. Make sure you discuss any questions you have with your health care provider.   Document Released: 03/24/2001 Document Revised: 04/20/2014 Document Reviewed: 05/31/2012 Elsevier Interactive Patient Education 2016 [employer]   Genetic Counseling WHAT IS GENETIC COUNSELING?  Genetic counseling is a meeting with a trained health professional (geneticist or genetic  counselor) about:   Genetic or chromosomal disorders.  Your risks for health conditions that are passed down through families (inherited). This information is kept private and confidential. Genetic counseling can:  Explain the diagnosis of a condition.  Tell you about your risk of developing certain conditions, such as cancer.  Help you understand patterns of disease in your family.  Help you find resources for coping with a diagnosis.  Help you plan for your next steps. This may include referrals to specialists. WHO DOES GENETIC COUNSELING?  People who do genetic counseling are health professionals, including:   Medical doctors.  Nurses.  Social workers.  Researchers with training in genetic conditions. Some genetic counselors specialize in specific conditions, age groups, or groups of people. Sometimes counselors will  also refer you to a physician who specializes in a particular condition.  WHO SHOULD GET GENETIC COUNSELING?  You may benefit from genetic counseling if:  You have a condition that is:  Genetic.  Chromosomal.  Inherited.  You have a child with a genetic or chromosomal condition.  You want to know more about your risk for inherited conditions. These include:  Heart disease.  Cancer.  Blood disorders.  Mental illness.  You want to know how your genetic risk might be affected by lifestyle choices.  You are worried about passing on an inherited condition to your child.  You want to understand or respond to the results of genetic testing.  You have had two or more miscarriages or stillbirths and you want to become pregnant.  You are pregnant and you are 46 years of age or older. This is when the risk of genetic abnormalities increases.  You want to know more about the risk of conditions that are common among your ethnic group. HOW CAN I FIND A GENETIC COUNSELOR?   Ask your health care provider to suggest a genetic counselor in your area.  Search online on the website of the [employer]. The website is: [employer] HOW SHOULD I PREPARE FOR MY GENETIC COUNSELING SESSION?  Find out if counseling and testing are covered under your health insurance plan.  Make sure you have all recommended screenings or tests.  Gather your personal medical records.  Bring information about your family's medical history.  Write down any questions you may have. WHAT CAN I EXPECT DURING MY GENETIC COUNSELING SESSION? Plan to talk about:  Your family and personal medical history.  Possible patterns of inherited conditions.  Genetic testing options or the results of genetic tests.  The results of tests for genetic conditions.  The meaning of a diagnosis, if your health care provider says it is genetic,  inherited, or chromosomal.  Strategies for preventing, identifying, or managing genetic conditions.  Resources for further information, support, or care. WHAT HAPPENS AFTER GENETIC COUNSELING?   You should receive a letter summarizing the information you discussed with your genetic counselor.  You may want to follow up with specialists or other resources for support and information.  You can talk to your genetic counselor again for more information or support.   This information is not intended to replace advice given to you by your health care provider. Make sure you discuss any questions you have with your health care provider.   Document Released: 06/20/2002 Document Revised: 04/20/2014 Document Reviewed: 12/06/2013 Elsevier Interactive Patient Education [employer]

## 2016-01-14 NOTE — Progress Notes (Signed)
Addendum 01/14/16 0856 Medicaid home form completed

## 2016-01-16 ENCOUNTER — Encounter: Payer: Self-pay | Admitting: Family Medicine

## 2016-01-16 ENCOUNTER — Other Ambulatory Visit: Payer: Self-pay | Admitting: *Deleted

## 2016-01-16 DIAGNOSIS — F1010 Alcohol abuse, uncomplicated: Secondary | ICD-10-CM

## 2016-01-16 DIAGNOSIS — O0990 Supervision of high risk pregnancy, unspecified, unspecified trimester: Secondary | ICD-10-CM

## 2016-01-16 DIAGNOSIS — Z8759 Personal history of other complications of pregnancy, childbirth and the puerperium: Secondary | ICD-10-CM

## 2016-01-16 DIAGNOSIS — O99312 Alcohol use complicating pregnancy, second trimester: Secondary | ICD-10-CM

## 2016-01-16 DIAGNOSIS — O99310 Alcohol use complicating pregnancy, unspecified trimester: Secondary | ICD-10-CM

## 2016-01-16 LAB — PAIN MGMT, PROFILE 6 CONF W/O MM, U
6 Acetylmorphine: NEGATIVE ng/mL (ref <10)
ALCOHOL METABOLITES: POSITIVE ng/mL — AB (ref <500)
AMPHETAMINES: NEGATIVE ng/mL (ref <500)
BARBITURATES: NEGATIVE ng/mL (ref <300)
BENZODIAZEPINES: NEGATIVE ng/mL (ref <100)
COCAINE METABOLITE: NEGATIVE ng/mL (ref <150)
CREATININE: 269.4 mg/dL (ref >=20.0)
Ethyl Glucuronide (ETG): 1258 ng/mL — ABNORMAL HIGH (ref <500)
Ethyl Sulfate (ETS): 830 ng/mL — ABNORMAL HIGH (ref <100)
METHADONE METABOLITE: NEGATIVE ng/mL (ref <100)
Marijuana Metabolite: NEGATIVE ng/mL (ref <20)
OPIATES: NEGATIVE ng/mL (ref <100)
OXYCODONE: NEGATIVE ng/mL (ref <100)
Oxidant: NEGATIVE ug/mL (ref <200)
Phencyclidine: NEGATIVE ng/mL (ref <25)
Please note:: 0
pH: 6.47 (ref 4.5–9.0)

## 2016-01-19 ENCOUNTER — Encounter: Payer: Self-pay | Admitting: Family Medicine

## 2016-01-21 ENCOUNTER — Other Ambulatory Visit: Payer: Medicaid Other

## 2016-01-22 ENCOUNTER — Ambulatory Visit (INDEPENDENT_AMBULATORY_CARE_PROVIDER_SITE_OTHER): Payer: Medicaid Other | Admitting: Clinical

## 2016-01-22 ENCOUNTER — Ambulatory Visit: Payer: Medicaid Other | Admitting: *Deleted

## 2016-01-22 DIAGNOSIS — O0990 Supervision of high risk pregnancy, unspecified, unspecified trimester: Secondary | ICD-10-CM

## 2016-01-22 DIAGNOSIS — Z719 Counseling, unspecified: Secondary | ICD-10-CM | POA: Diagnosis not present
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
## 2016-01-23 LAB — GLUCOSE TOLERANCE, 1 HOUR (50G) W/O FASTING: Glucose, 1 Hr, gestational: 82 mg/dL (ref <140)

## 2016-01-23 LAB — PROTEIN / CREATININE RATIO, URINE
Creatinine, Urine: 248 mg/dL (ref 20–320)
PROTEIN CREATININE RATIO: 85 mg/g{creat} (ref 21–161)
Total Protein, Urine: 21 mg/dL (ref 5–24)

## 2016-01-28 LAB — ALPHA FETOPROTEIN, MATERNAL
AFP: 76.9 ng/mL
Curr Gest Age: 16.3 weeks
MOM FOR AFP: 2.04
Open Spina bifida: NEGATIVE

## 2016-02-06 ENCOUNTER — Ambulatory Visit: Payer: Medicaid Other | Admitting: General Practice

## 2016-02-06 DIAGNOSIS — N898 Other specified noninflammatory disorders of vagina: Secondary | ICD-10-CM

## 2016-02-06 LAB — POCT URINALYSIS DIP (DEVICE)
Bilirubin Urine: NEGATIVE
Glucose, UA: 100 mg/dL — AB
HGB URINE DIPSTICK: NEGATIVE
Leukocytes, UA: NEGATIVE
NITRITE: NEGATIVE
PH: 6 (ref 5.0–8.0)
Protein, ur: NEGATIVE mg/dL
Specific Gravity, Urine: 1.025 (ref 1.005–1.030)
UROBILINOGEN UA: 0.2 mg/dL (ref 0.0–1.0)

## 2016-02-06 MED ORDER — METRONIDAZOLE 500 MG PO TABS: 500.0000 mg | ORAL_TABLET | Freq: Two times a day (BID) | ORAL | 0 refills | Status: DC

## 2016-02-06 NOTE — Addendum Note (Signed)
Addended by: Kathee DeltonHILLMAN, CARRIE L on: 02/06/2016 02:51 PM   Modules accepted: Orders

## 2016-02-06 NOTE — Progress Notes (Signed)
Patient here for vaginal irritation, vaginal discharge with odor and itching. Per Dr Genevie AnnSchenk, may send in flagyl and do wet prep. Instructed patient on self swab & informed her of Rx to be sent in. Patient had no questions

## 2016-02-07 ENCOUNTER — Encounter (HOSPITAL_COMMUNITY): Payer: Self-pay

## 2016-02-07 ENCOUNTER — Ambulatory Visit (HOSPITAL_COMMUNITY)
Admission: RE | Admit: 2016-02-07 | Discharge: 2016-02-07 | Disposition: A | Discharge status: Home / Self Care | Payer: Medicaid Other | Source: Ambulatory Visit | Attending: Family Medicine | Admitting: Family Medicine

## 2016-02-07 ENCOUNTER — Ambulatory Visit (HOSPITAL_COMMUNITY): Payer: Medicaid Other

## 2016-02-07 ENCOUNTER — Ambulatory Visit (HOSPITAL_COMMUNITY)
Admission: RE | Admit: 2016-02-07 | Payer: Medicaid Other | Source: Ambulatory Visit | Attending: Obstetrics and Gynecology | Admitting: Obstetrics and Gynecology

## 2016-02-07 ENCOUNTER — Other Ambulatory Visit: Payer: Self-pay | Admitting: Family Medicine

## 2016-02-07 DIAGNOSIS — Z363 Encounter for antenatal screening for malformations: Secondary | ICD-10-CM

## 2016-02-07 DIAGNOSIS — F1099 Alcohol use, unspecified with unspecified alcohol-induced disorder: Secondary | ICD-10-CM | POA: Insufficient documentation

## 2016-02-07 DIAGNOSIS — O09292 Supervision of pregnancy with other poor reproductive or obstetric history, second trimester: Secondary | ICD-10-CM | POA: Insufficient documentation

## 2016-02-07 DIAGNOSIS — Z8759 Personal history of other complications of pregnancy, childbirth and the puerperium: Secondary | ICD-10-CM

## 2016-02-07 DIAGNOSIS — O99312 Alcohol use complicating pregnancy, second trimester: Secondary | ICD-10-CM

## 2016-02-07 DIAGNOSIS — O0990 Supervision of high risk pregnancy, unspecified, unspecified trimester: Secondary | ICD-10-CM

## 2016-02-07 DIAGNOSIS — Z3A18 18 weeks gestation of pregnancy: Secondary | ICD-10-CM | POA: Diagnosis not present

## 2016-02-07 DIAGNOSIS — F1010 Alcohol abuse, uncomplicated: Secondary | ICD-10-CM

## 2016-02-07 DIAGNOSIS — O09299 Supervision of pregnancy with other poor reproductive or obstetric history, unspecified trimester: Secondary | ICD-10-CM

## 2016-02-07 LAB — WET PREP, GENITAL: Trich, Wet Prep: NONE SEEN

## 2016-02-07 NOTE — Progress Notes (Signed)
MFM consult, staff note:  By way of consultation, I discussed the patient's history of term IUFD. The history of stillbirth qualifies as a clinical criteria to be screened for APS (antiphospholipid antibody syndrome).  I spoke with the patient at length about the implications of antiphospholipid syndrome (APS) in pregnancy. As you know, women with this diagnosis are at substantial risk for the development of thromboembolism as well as adverse pregnancy complications such as recurrent miscarriage, stillbirth, placental insufficiency, and preeclampsia. The diagnosis of APS should be based on the presence of one of these clinical factors as well as a positive laboratory test for an antiphospholipid antibody such as the lupus anticoagulant, IgG anticardiolipin, and IgM anticardiolipin. Anti-beta2glycoprotein I antibodies have also been associated with APS.  The use of thromboprophylaxis with heparin during pregnancy has been  shown to decrease the risk of thromboembolism and pregnancy loss. Its effect on placental insufficiency and preeclampsia is not as dramatic. Pregnancy management also typically includes prenatal visits every 1 -2 weeks throughout pregnancy, serial ultrasound assesments of fetal growth, and antenatal surveillance initiated at around 32 weeks of gestation or earlier. Delivery at 39 weeks should be anticipated and care should be taken in the peripartum period to reduce the risk of thromboembolism.  After MFM consultation following her ultrasound the patient accepted and desired a complete workup for antiphosphilipid antibody syndrome. A positive finding would result in the need perform a second panel for APS to document 2 positives 12 weeks apart by [place] criteria for diagnosis of APS syndrome.  Recommendations (hx IUFD): 1. ASA 81 mg po qd 2. Monthly interval growth 3. Begin antenatal testing at 32 weeks 4. APS panel drawn today. If positive, LMWH prophylaxis will be needed  until 6 weeks postpartum 5. See genetic counseling note (separate) 6. Patient declines genetic screening 7. Delivery at 39 weeks  Time Spent: I spent in excess of 30 minutes in consultation with this patient to review records, evaluate her case, and provide her with an adequate discussion and education. More than 50% of this time was spent in direct face-to-face counseling.  It was a pleasure seeing your patient in the office today. Thank you for consultation. Please do not hesitate to contact our service for any further questions.   Thank you, Louann SjogrenJeffrey Morgan Gaynelle Arabianenney Denney, Louann SjogrenJeffrey Morgan, MD, MS, FACOG Assistant Professor Section of Maternal-Fetal Medicine [facility]

## 2016-02-08 LAB — LUPUS ANTICOAGULANT PANEL
DRVVT: 34 s (ref 0.0–47.0)
PTT LA: 33.3 s (ref 0.0–51.9)

## 2016-02-09 LAB — CARDIOLIPIN ANTIBODIES, IGG, IGM, IGA
Anticardiolipin IgG: <9 GPL U/mL (ref 0–14)
Anticardiolipin IgM: <9 MPL U/mL (ref 0–12)

## 2016-02-10 ENCOUNTER — Other Ambulatory Visit (HOSPITAL_COMMUNITY): Payer: Self-pay | Admitting: Obstetrics and Gynecology

## 2016-02-10 ENCOUNTER — Encounter: Payer: Medicaid Other | Admitting: Obstetrics and Gynecology

## 2016-02-10 DIAGNOSIS — O99310 Alcohol use complicating pregnancy, unspecified trimester: Secondary | ICD-10-CM

## 2016-02-10 DIAGNOSIS — F1010 Alcohol abuse, uncomplicated: Secondary | ICD-10-CM

## 2016-02-10 DIAGNOSIS — Z3A22 22 weeks gestation of pregnancy: Secondary | ICD-10-CM

## 2016-02-10 DIAGNOSIS — Z3A34 34 weeks gestation of pregnancy: Secondary | ICD-10-CM

## 2016-02-10 DIAGNOSIS — Z3A30 30 weeks gestation of pregnancy: Secondary | ICD-10-CM

## 2016-02-10 DIAGNOSIS — Z0489 Encounter for examination and observation for other specified reasons: Secondary | ICD-10-CM

## 2016-02-10 DIAGNOSIS — IMO0002 Reserved for concepts with insufficient information to code with codable children: Secondary | ICD-10-CM

## 2016-02-10 DIAGNOSIS — Z3A26 26 weeks gestation of pregnancy: Secondary | ICD-10-CM

## 2016-02-10 DIAGNOSIS — O09292 Supervision of pregnancy with other poor reproductive or obstetric history, second trimester: Secondary | ICD-10-CM

## 2016-02-10 LAB — BETA-2-GLYCOPROTEIN I ABS, IGG/M/A
Beta-2 Glyco I IgG: <9 GPI IgG units (ref 0–20)
Beta-2-Glycoprotein I IgA: <9 GPI IgA units (ref 0–25)
Beta-2-Glycoprotein I IgM: <9 GPI IgM units (ref 0–32)

## 2016-02-12 ENCOUNTER — Telehealth: Payer: Self-pay | Admitting: *Deleted

## 2016-02-12 MED ORDER — FLUCONAZOLE 150 MG PO TABS: 150.0000 mg | ORAL_TABLET | Freq: Once | ORAL | 0 refills | Status: AC

## 2016-02-12 NOTE — Telephone Encounter (Signed)
Called pt twice with no answer. She needs to be informed of wet prep results showing +BV and +yeast. Rx for metronidazole previously sent to pharmacy and Rx for fluconazole sent today.

## 2016-02-13 ENCOUNTER — Encounter: Payer: Medicaid Other | Admitting: Family Medicine

## 2016-02-14 ENCOUNTER — Encounter: Payer: Self-pay | Admitting: *Deleted

## 2016-02-14 NOTE — Telephone Encounter (Signed)
Attempted to call patient. No answer. Voice mail left stating I am calling with test results. Please return my call at the clinic. Will send letter as well.

## 2016-02-17 ENCOUNTER — Ambulatory Visit (INDEPENDENT_AMBULATORY_CARE_PROVIDER_SITE_OTHER): Payer: Medicaid Other | Admitting: Family Medicine

## 2016-02-17 VITALS — BP 122/70 | HR 82 | Wt 171.0 lb

## 2016-02-17 DIAGNOSIS — O99332 Smoking (tobacco) complicating pregnancy, second trimester: Secondary | ICD-10-CM

## 2016-02-17 DIAGNOSIS — F1010 Alcohol abuse, uncomplicated: Secondary | ICD-10-CM

## 2016-02-17 DIAGNOSIS — O99519 Diseases of the respiratory system complicating pregnancy, unspecified trimester: Secondary | ICD-10-CM

## 2016-02-17 DIAGNOSIS — O99312 Alcohol use complicating pregnancy, second trimester: Secondary | ICD-10-CM

## 2016-02-17 DIAGNOSIS — O99512 Diseases of the respiratory system complicating pregnancy, second trimester: Secondary | ICD-10-CM

## 2016-02-17 DIAGNOSIS — O0990 Supervision of high risk pregnancy, unspecified, unspecified trimester: Secondary | ICD-10-CM

## 2016-02-17 DIAGNOSIS — Z8759 Personal history of other complications of pregnancy, childbirth and the puerperium: Secondary | ICD-10-CM

## 2016-02-17 DIAGNOSIS — O09292 Supervision of pregnancy with other poor reproductive or obstetric history, second trimester: Secondary | ICD-10-CM

## 2016-02-17 DIAGNOSIS — O99311 Alcohol use complicating pregnancy, first trimester: Secondary | ICD-10-CM

## 2016-02-17 DIAGNOSIS — J45909 Unspecified asthma, uncomplicated: Secondary | ICD-10-CM

## 2016-02-17 DIAGNOSIS — O09299 Supervision of pregnancy with other poor reproductive or obstetric history, unspecified trimester: Secondary | ICD-10-CM

## 2016-02-17 NOTE — Patient Instructions (Signed)
Second Trimester of Pregnancy  The second trimester is from week 13 through week 28, month 4 through 6. This is often the time in pregnancy that you feel your best. Often times, morning sickness has lessened or quit. You may have more energy, and you may get hungry more often. Your unborn baby (fetus) is growing rapidly. At the end of the sixth month, he or she is about 9 inches long and weighs about 1½ pounds. You will likely feel the baby move (quickening) between 18 and 20 weeks of pregnancy.  HOME CARE   · Avoid all smoking, herbs, and alcohol. Avoid drugs not approved by your doctor.  · Do not use any tobacco products, including cigarettes, chewing tobacco, and electronic cigarettes. If you need help quitting, ask your doctor. You may get counseling or other support to help you quit.  · Only take medicine as told by your doctor. Some medicines are safe and some are not during pregnancy.  · Exercise only as told by your doctor. Stop exercising if you start having cramps.  · Eat regular, healthy meals.  · Wear a good support bra if your breasts are tender.  · Do not use hot tubs, steam rooms, or saunas.  · Wear your seat belt when driving.  · Avoid raw meat, uncooked cheese, and liter boxes and soil used by cats.  · Take your prenatal vitamins.  · Take 1500-2000 milligrams of calcium daily starting at the 20th week of pregnancy until you deliver your baby.  · Try taking medicine that helps you poop (stool softener) as needed, and if your doctor approves. Eat more fiber by eating fresh fruit, vegetables, and whole grains. Drink enough fluids to keep your pee (urine) clear or pale yellow.  · Take warm water baths (sitz baths) to soothe pain or discomfort caused by hemorrhoids. Use hemorrhoid cream if your doctor approves.  · If you have puffy, bulging veins (varicose veins), wear support hose. Raise (elevate) your feet for 15 minutes, 3-4 times a day. Limit salt in your diet.  · Avoid heavy lifting, wear low heals,  and sit up straight.  · Rest with your legs raised if you have leg cramps or low back pain.  · Visit your dentist if you have not gone during your pregnancy. Use a soft toothbrush to brush your teeth. Be gentle when you floss.  · You can have sex (intercourse) unless your doctor tells you not to.  · Go to your doctor visits.  GET HELP IF:   · You feel dizzy.  · You have mild cramps or pressure in your lower belly (abdomen).  · You have a nagging pain in your belly area.  · You continue to feel sick to your stomach (nauseous), throw up (vomit), or have watery poop (diarrhea).  · You have bad smelling fluid coming from your vagina.  · You have pain with peeing (urination).  GET HELP RIGHT AWAY IF:   · You have a fever.  · You are leaking fluid from your vagina.  · You have spotting or bleeding from your vagina.  · You have severe belly cramping or pain.  · You lose or gain weight rapidly.  · You have trouble catching your breath and have chest pain.  · You notice sudden or extreme puffiness (swelling) of your face, hands, ankles, feet, or legs.  · You have not felt the baby move in over an hour.  · You have severe headaches that do   not go away with medicine.  · You have vision changes.     This information is not intended to replace advice given to you by your health care provider. Make sure you discuss any questions you have with your health care provider.     Document Released: 06/24/2009 Document Revised: 04/20/2014 Document Reviewed: 05/31/2012  Elsevier Interactive Patient Education ©2016 Elsevier Inc.

## 2016-02-17 NOTE — Progress Notes (Signed)
Subjective:  Jordan Hall is a 30 y.o. [id number] at 472w0d being seen today for ongoing prenatal care.  She is currently monitored for the following issues for this high-risk pregnancy and has H/O macrosomia in infant in prior pregnancy, currently pregnant; Tobacco smoking affecting pregnancy; History of term stillbirth; Supervision of high risk pregnancy, antepartum; Asthma affecting pregnancy, antepartum; and Alcohol abuse affecting pregnancy on her problem list.  Patient reports no complaints.  Contractions: Not present. Vag. Bleeding: None.  Movement: Present. Denies leaking of fluid.   The following portions of the patient's history were reviewed and updated as appropriate: allergies, current medications, past family history, past medical history, past social history, past surgical history and problem list. Problem list updated.  Objective:   Vitals:   02/17/16 1047  BP: 122/70  Pulse: 82  Weight: 171 lb (77.6 kg)    Fetal Status: Fetal Heart Rate (bpm): 150   Movement: Present     General:  Alert, oriented and cooperative. Patient is in no acute distress.  Skin: Skin is warm and dry. No rash noted.   Cardiovascular: Normal heart rate noted  Respiratory: Normal respiratory effort, no problems with respiration noted  Abdomen: Soft, gravid, appropriate for gestational age. Pain/Pressure: Absent     Pelvic:  Cervical exam deferred        Extremities: Normal range of motion.  Edema: None  Mental Status: Normal mood and affect. Normal behavior. Normal judgment and thought content.   Urinalysis:      Assessment and Plan:  Pregnancy: [id number] at 5672w0d  1. Supervision of high risk pregnancy, antepartum  2. History of term stillbirth - MFM consulted, seen, worked up negative - Repeat US monthly - weekly NST starting 28 weeks, twice weekly NST starting 32 weeks - on ASA 81mg   3. H/O macrosomia in infant in prior pregnancy, currently pregnant - early 1 hr GTT negative  4. Tobacco  smoking affecting pregnancy in second trimester - quitting, occasionally has 1-2 cig/day now, was 1/4 ppd smoker  5. Asthma affecting pregnancy, antepartum - mild intermittent, controlled  6. Alcohol abuse affecting pregnancy in first trimester - Per patient, drank a glass of wine 2 days before visit, not currently drinking. Repeat UDS today for alcohol in urine. - Pain Mgmt, Profile 6 Conf w/o mM, U  Preterm labor symptoms and general obstetric precautions including but not limited to vaginal bleeding, contractions, leaking of fluid and fetal movement were reviewed in detail with the patient. Please refer to After Visit Summary for other counseling recommendations.  Return in about 4 weeks (around 03/16/2016) for Routine OB visit.   Cleda ClarksElizabeth W. Myliah Medel, DO  OB Fellow [facility]

## 2016-02-27 ENCOUNTER — Encounter (HOSPITAL_COMMUNITY): Payer: Self-pay

## 2016-02-27 ENCOUNTER — Emergency Department (HOSPITAL_COMMUNITY)
Admission: EM | Admit: 2016-02-27 | Discharge: 2016-02-27 | Disposition: A | Discharge status: Home / Self Care | Payer: Medicaid Other | Attending: Emergency Medicine | Admitting: Emergency Medicine

## 2016-02-27 DIAGNOSIS — R0789 Other chest pain: Secondary | ICD-10-CM | POA: Insufficient documentation

## 2016-02-27 DIAGNOSIS — O99332 Smoking (tobacco) complicating pregnancy, second trimester: Secondary | ICD-10-CM | POA: Insufficient documentation

## 2016-02-27 DIAGNOSIS — F17210 Nicotine dependence, cigarettes, uncomplicated: Secondary | ICD-10-CM | POA: Diagnosis not present

## 2016-02-27 DIAGNOSIS — J45909 Unspecified asthma, uncomplicated: Secondary | ICD-10-CM | POA: Diagnosis not present

## 2016-02-27 DIAGNOSIS — S51012A Laceration without foreign body of left elbow, initial encounter: Secondary | ICD-10-CM | POA: Diagnosis not present

## 2016-02-27 DIAGNOSIS — O9A212 Injury, poisoning and certain other consequences of external causes complicating pregnancy, second trimester: Secondary | ICD-10-CM | POA: Insufficient documentation

## 2016-02-27 DIAGNOSIS — Y999 Unspecified external cause status: Secondary | ICD-10-CM | POA: Diagnosis not present

## 2016-02-27 DIAGNOSIS — S161XXA Strain of muscle, fascia and tendon at neck level, initial encounter: Secondary | ICD-10-CM

## 2016-02-27 DIAGNOSIS — S41112A Laceration without foreign body of left upper arm, initial encounter: Secondary | ICD-10-CM

## 2016-02-27 DIAGNOSIS — S5011XA Contusion of right forearm, initial encounter: Secondary | ICD-10-CM | POA: Diagnosis not present

## 2016-02-27 DIAGNOSIS — T07XXXA Unspecified multiple injuries, initial encounter: Secondary | ICD-10-CM

## 2016-02-27 DIAGNOSIS — Z91040 Latex allergy status: Secondary | ICD-10-CM | POA: Diagnosis not present

## 2016-02-27 DIAGNOSIS — Y92009 Unspecified place in unspecified non-institutional (private) residence as the place of occurrence of the external cause: Secondary | ICD-10-CM | POA: Insufficient documentation

## 2016-02-27 DIAGNOSIS — S40022A Contusion of left upper arm, initial encounter: Secondary | ICD-10-CM | POA: Diagnosis not present

## 2016-02-27 DIAGNOSIS — Y939 Activity, unspecified: Secondary | ICD-10-CM | POA: Diagnosis not present

## 2016-02-27 DIAGNOSIS — Z3A21 21 weeks gestation of pregnancy: Secondary | ICD-10-CM | POA: Insufficient documentation

## 2016-02-27 NOTE — ED Notes (Signed)
Pt states she can now feel her baby moving.
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
## 2016-02-27 NOTE — ED Notes (Signed)
Pt discharged in no acute distress. States she is feeling better. Instructed patient on follow up care as well as things to look for in pregnancy that require emergent follow up. Pt voiced understanding.

## 2016-03-02 ENCOUNTER — Encounter (HOSPITAL_COMMUNITY): Payer: Self-pay

## 2016-03-02 ENCOUNTER — Ambulatory Visit (HOSPITAL_COMMUNITY)
Admission: RE | Admit: 2016-03-02 | Discharge: 2016-03-02 | Disposition: A | Discharge status: Home / Self Care | Payer: Medicaid Other | Source: Ambulatory Visit | Attending: Family Medicine | Admitting: Family Medicine

## 2016-03-02 DIAGNOSIS — Z0489 Encounter for examination and observation for other specified reasons: Secondary | ICD-10-CM

## 2016-03-02 DIAGNOSIS — O99310 Alcohol use complicating pregnancy, unspecified trimester: Secondary | ICD-10-CM

## 2016-03-02 DIAGNOSIS — IMO0002 Reserved for concepts with insufficient information to code with codable children: Secondary | ICD-10-CM

## 2016-03-02 DIAGNOSIS — Z3A22 22 weeks gestation of pregnancy: Secondary | ICD-10-CM | POA: Diagnosis not present

## 2016-03-02 DIAGNOSIS — O09292 Supervision of pregnancy with other poor reproductive or obstetric history, second trimester: Secondary | ICD-10-CM | POA: Diagnosis present

## 2016-03-02 DIAGNOSIS — F1099 Alcohol use, unspecified with unspecified alcohol-induced disorder: Secondary | ICD-10-CM | POA: Diagnosis not present

## 2016-03-02 DIAGNOSIS — O99332 Smoking (tobacco) complicating pregnancy, second trimester: Secondary | ICD-10-CM | POA: Insufficient documentation

## 2016-03-02 DIAGNOSIS — O99312 Alcohol use complicating pregnancy, second trimester: Secondary | ICD-10-CM | POA: Insufficient documentation

## 2016-03-02 DIAGNOSIS — F1010 Alcohol abuse, uncomplicated: Secondary | ICD-10-CM

## 2016-03-04 ENCOUNTER — Telehealth: Payer: Self-pay | Admitting: *Deleted

## 2016-03-04 MED ORDER — ALBUTEROL SULFATE HFA 108 (90 BASE) MCG/ACT IN AERS: 2.0000 | INHALATION_SPRAY | RESPIRATORY_TRACT | 0 refills | Status: DC | PRN

## 2016-03-04 NOTE — Telephone Encounter (Signed)
Message left by Pregnancy Care Manager stating that pt needs a refill of her albuterol inhaler due to asthma. Rx approved by Dr. Vergie LivingPickens and order sent to pharmacy. I called pt to inform her of Rx sent to pharmacy and she stated that she also needed Metronidazole because her pharmacy never received the prescription a month ago and she is still having sx. I called the pharmacy and was told that pt did in fact obtain the medication on the Jefrey Raburn it was prescribed - 02/06/16.

## 2016-03-09 ENCOUNTER — Other Ambulatory Visit: Payer: Self-pay | Admitting: *Deleted

## 2016-03-09 NOTE — Progress Notes (Signed)
Found prescription had printed instead of eprescribing- called pharmacy to verify and rx phoned in.

## 2016-03-11 ENCOUNTER — Telehealth: Payer: Self-pay | Admitting: Obstetrics & Gynecology

## 2016-03-11 ENCOUNTER — Telehealth: Payer: Self-pay | Admitting: *Deleted

## 2016-03-11 DIAGNOSIS — N898 Other specified noninflammatory disorders of vagina: Secondary | ICD-10-CM

## 2016-03-11 MED ORDER — ALBUTEROL SULFATE HFA 108 (90 BASE) MCG/ACT IN AERS: 2.0000 | INHALATION_SPRAY | RESPIRATORY_TRACT | 0 refills | Status: DC | PRN

## 2016-03-11 MED ORDER — METRONIDAZOLE 500 MG PO TABS: 500.0000 mg | ORAL_TABLET | Freq: Two times a day (BID) | ORAL | 0 refills | Status: DC

## 2016-03-11 NOTE — Telephone Encounter (Signed)
Patient called to say she still had not gotten her Rx. Was really upset because no one was returning her calls.

## 2016-03-11 NOTE — Telephone Encounter (Signed)
Patient call was transferred to me because she was upset about her medications.  I spoke with patient.  States she was called about refill on her inhaler and a prescription to treat BV.  States she received this call on 03/04/16.  States when she arrived at [facility] to pickup medication she was told that the prescriptions had expired on 02/06/16.  Patient requests the medication be phoned in to [facility] on the corner of [address].  Explained to patient I would have someone call in for her.  Patient states understanding.

## 2016-03-16 ENCOUNTER — Encounter: Payer: Self-pay | Admitting: Medical

## 2016-03-25 ENCOUNTER — Ambulatory Visit (INDEPENDENT_AMBULATORY_CARE_PROVIDER_SITE_OTHER): Payer: Medicaid Other | Admitting: Obstetrics and Gynecology

## 2016-03-25 VITALS — BP 126/69 | HR 89 | Wt 180.9 lb

## 2016-03-25 DIAGNOSIS — O99512 Diseases of the respiratory system complicating pregnancy, second trimester: Secondary | ICD-10-CM

## 2016-03-25 DIAGNOSIS — O99519 Diseases of the respiratory system complicating pregnancy, unspecified trimester: Secondary | ICD-10-CM

## 2016-03-25 DIAGNOSIS — Z8759 Personal history of other complications of pregnancy, childbirth and the puerperium: Secondary | ICD-10-CM

## 2016-03-25 DIAGNOSIS — O09299 Supervision of pregnancy with other poor reproductive or obstetric history, unspecified trimester: Secondary | ICD-10-CM

## 2016-03-25 DIAGNOSIS — O0990 Supervision of high risk pregnancy, unspecified, unspecified trimester: Secondary | ICD-10-CM

## 2016-03-25 DIAGNOSIS — J45909 Unspecified asthma, uncomplicated: Secondary | ICD-10-CM

## 2016-03-25 DIAGNOSIS — O99312 Alcohol use complicating pregnancy, second trimester: Secondary | ICD-10-CM

## 2016-03-25 DIAGNOSIS — O09292 Supervision of pregnancy with other poor reproductive or obstetric history, second trimester: Secondary | ICD-10-CM

## 2016-03-25 DIAGNOSIS — F1010 Alcohol abuse, uncomplicated: Secondary | ICD-10-CM
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
## 2016-03-25 NOTE — Progress Notes (Signed)
Rout.

## 2016-03-25 NOTE — Progress Notes (Signed)
Discussed excessive weight gain strategies to decrease continued weight gain.

## 2016-03-25 NOTE — Telephone Encounter (Addendum)
Late entry for encounter on 03/11/16:  Rx for Albuteral inhaler phoned to pharmacy - was e-prescribed last week however did not go through. Rx for metronidazole also called in per pt request. I called pt and apologized for the problem with the Rx not going through previously.  She may now pick up both prescriptions.  Pt voiced understanding.

## 2016-03-25 NOTE — Addendum Note (Signed)
Addended by: Gerome ApleyZEYFANG, Judge Duque L on: 03/25/2016 11:38 AM   Modules accepted: Orders

## 2016-03-26 LAB — PAIN MGMT, PROFILE 6 CONF W/O MM, U
6 Acetylmorphine: NEGATIVE ng/mL (ref <10)
ALCOHOL METABOLITES: NEGATIVE ng/mL (ref <500)
Amphetamines: NEGATIVE ng/mL (ref <500)
BARBITURATES: NEGATIVE ng/mL (ref <300)
Benzodiazepines: NEGATIVE ng/mL (ref <100)
CREATININE: 166.2 mg/dL (ref >=20.0)
Cocaine Metabolite: NEGATIVE ng/mL (ref <150)
MARIJUANA METABOLITE: NEGATIVE ng/mL (ref <20)
METHADONE METABOLITE: NEGATIVE ng/mL (ref <100)
OPIATES: NEGATIVE ng/mL (ref <100)
OXYCODONE: NEGATIVE ng/mL (ref <100)
Oxidant: NEGATIVE ug/mL (ref <200)
PH: 6.97 (ref 4.5–9.0)
PHENCYCLIDINE: NEGATIVE ng/mL (ref <25)
Please note:: 0

## 2016-03-27 ENCOUNTER — Encounter (HOSPITAL_COMMUNITY): Payer: Self-pay

## 2016-03-28 ENCOUNTER — Emergency Department (HOSPITAL_COMMUNITY)
Admission: EM | Admit: 2016-03-28 | Discharge: 2016-03-28 | Disposition: A | Discharge status: Home / Self Care | Payer: Medicaid Other | Attending: Emergency Medicine | Admitting: Emergency Medicine

## 2016-03-28 ENCOUNTER — Encounter (HOSPITAL_COMMUNITY): Payer: Self-pay | Admitting: Emergency Medicine

## 2016-03-28 DIAGNOSIS — J4521 Mild intermittent asthma with (acute) exacerbation: Secondary | ICD-10-CM | POA: Diagnosis not present

## 2016-03-28 DIAGNOSIS — Z7982 Long term (current) use of aspirin: Secondary | ICD-10-CM | POA: Diagnosis not present

## 2016-03-28 DIAGNOSIS — F17210 Nicotine dependence, cigarettes, uncomplicated: Secondary | ICD-10-CM | POA: Diagnosis not present

## 2016-03-28 DIAGNOSIS — R0602 Shortness of breath: Secondary | ICD-10-CM | POA: Diagnosis present

## 2016-03-28 MED ORDER — PREDNISONE 20 MG PO TABS: 60.0000 mg | ORAL_TABLET | Freq: Every day | ORAL | 0 refills | Status: DC

## 2016-03-28 MED ORDER — ALBUTEROL SULFATE (2.5 MG/3ML) 0.083% IN NEBU
5.0000 mg | INHALATION_SOLUTION | Freq: Once | RESPIRATORY_TRACT | Status: AC
  Administered 2016-03-28: 5 mg via RESPIRATORY_TRACT

## 2016-03-28 MED ORDER — ALBUTEROL (5 MG/ML) CONTINUOUS INHALATION SOLN
10.0000 mg/h | INHALATION_SOLUTION | Freq: Once | RESPIRATORY_TRACT | Status: AC
  Administered 2016-03-28: 10 mg/h via RESPIRATORY_TRACT
  Filled 2016-03-28: qty 20

## 2016-03-28 MED ORDER — IPRATROPIUM-ALBUTEROL 0.5-2.5 (3) MG/3ML IN SOLN
3.0000 mL | Freq: Once | RESPIRATORY_TRACT | Status: AC
  Administered 2016-03-28: 3 mL via RESPIRATORY_TRACT
  Filled 2016-03-28: qty 3

## 2016-03-28 MED ORDER — ALBUTEROL SULFATE (2.5 MG/3ML) 0.083% IN NEBU
INHALATION_SOLUTION | RESPIRATORY_TRACT | Status: AC
  Filled 2016-03-28: qty 6

## 2016-03-28 MED ORDER — PREDNISONE 20 MG PO TABS
60.0000 mg | ORAL_TABLET | Freq: Once | ORAL | Status: AC
  Administered 2016-03-28: 60 mg via ORAL
  Filled 2016-03-28: qty 3

## 2016-03-28 NOTE — ED Triage Notes (Signed)
Pt here with c/o shortness of breath-- hx asthma-- wheezes throughout. Has been using inhaler, no relief. Cough productive occasionally, dry cough.

## 2016-03-28 NOTE — ED Provider Notes (Signed)
[facility] Provider Note   CSN: [id number] Arrival date & time: 03/28/16  1447  History   Chief Complaint Chief Complaint  Patient presents with  . Asthma    HPI Jordan Hall is a 30 y.o. female.  HPI  30 y.o. female [id number] around 8326 weeks gestation with a hx of Asthma, presents to the Emergency Department today complaining of shortness of breath. Attempted albuterol at home with minimal relief. States symptoms began 3-4 days ago. Pt with recent daughter that had "flu-like symptoms." No fevers at home. Notes cough that is dry. No sinus congestion. Mild rhinorrhea. No N/V/D. No CP. Notes shortness of breath with no relief with albuterol. Hx same with asthma. No other symptoms noted.   Past Medical History:  Diagnosis Date  . Abnormal Pap smear   . Asthma   . Hx of chlamydia infection     Patient Active Problem List   Diagnosis Date Noted  . Alcohol abuse affecting pregnancy 01/16/2016  . Supervision of high risk pregnancy, antepartum 01/13/2016  . Asthma affecting pregnancy, antepartum 01/13/2016  . History of term stillbirth 02/23/2013  . H/O macrosomia in infant in prior pregnancy, currently pregnant 06/14/2012  . Tobacco smoking affecting pregnancy 06/14/2012    Past Surgical History:  Procedure Laterality Date  . COLPOSCOPY      OB History    Gravida Para Term Preterm AB Living   7 3 2 1 3 2    SAB TAB Ectopic Multiple Live Births   2 0 0 0 2       Home Medications    Prior to Admission medications   Medication Sig Start Date End Date Taking? Authorizing Provider  albuterol (PROVENTIL HFA;VENTOLIN HFA) 108 (90 Base) MCG/ACT inhaler Inhale 2 puffs into the lungs every 4 (four) hours as needed for wheezing or shortness of breath. 03/11/16   [place] Bingharlie Pickens, MD  aspirin EC 81 MG tablet Take 81 mg by mouth daily.    Historical Provider, MD  Prenatal Vit-Fe Fumarate-FA (PRENATAL VITAMIN PO) Take 1 tablet by mouth daily.     Historical Provider, MD     Family History Family History  Problem Relation Age of Onset  . Asthma Daughter   . Hypertension Mother   . Thyroid disease Mother   . Hypertension Maternal Aunt   . Hypertension Maternal Grandmother   . Thyroid disease Maternal Grandmother   . Heart disease Father   . Seizures Father     Social History Social History  Substance Use Topics  . Smoking status: Current Every Day Smoker    Packs/day: 0.25    Types: Cigarettes    Last attempt to quit: 11/27/2015  . Smokeless tobacco: Never Used  . Alcohol use Yes     Comment: occasionally until pregnancy     Allergies   Latex   Review of Systems Review of Systems ROS reviewed and all are negative for acute change except as noted in the HPI.  Physical Exam Updated Vital Signs BP 124/70 (BP Location: Left Arm)   Pulse 111   Temp 98.3 F (36.8 C) (Oral)   Resp 20   Ht 5\' 4"  (1.626 m)   Wt 81.6 kg   LMP 09/02/2015   SpO2 96%   BMI 30.90 kg/m   Physical Exam  Constitutional: She is oriented to person, place, and time. Vital signs are normal. She appears well-developed and well-nourished.  Well Appearing. NAD  HENT:  Head: Normocephalic and atraumatic.  Right Ear: Hearing normal.  Left Ear: Hearing normal.  Eyes: Conjunctivae and EOM are normal. Pupils are equal, round, and reactive to light.  Neck: Normal range of motion. Neck supple.  Cardiovascular: Regular rhythm, normal heart sounds, intact distal pulses and normal pulses.  Tachycardia present.   Pulmonary/Chest: Effort normal. No respiratory distress. She has wheezes in the right upper field, the right lower field, the left upper field and the left lower field.  Abdominal: Soft.  Musculoskeletal: Normal range of motion.  Neurological: She is alert and oriented to person, place, and time.  Skin: Skin is warm and dry.  Psychiatric: She has a normal mood and affect. Her speech is normal and behavior is normal. Thought content normal.  Nursing note and  vitals reviewed.  ED Treatments / Results  Labs (all labs ordered are listed, but only abnormal results are displayed) Labs Reviewed - No data to display  EKG  EKG Interpretation None      Radiology No results found.  Procedures Procedures (including critical care time)  Medications Ordered in ED Medications  albuterol (PROVENTIL) (2.5 MG/3ML) 0.083% nebulizer solution (not administered)  albuterol (PROVENTIL) (2.5 MG/3ML) 0.083% nebulizer solution 5 mg (5 mg Nebulization Given 03/28/16 1532)     Initial Impression / Assessment and Plan / ED Course  I have reviewed the triage vital signs and the nursing notes.  Pertinent labs & imaging results that were available during my care of the patient were reviewed by me and considered in my medical decision making (see chart for details).  Clinical Course    Final Clinical Impressions(s) / ED Diagnoses   {I have reviewed the relevant previous healthcare records.  {I obtained HPI from historian. {Patient discussed with supervising physician.  ED Course:  Assessment: Pt is a 30yF [id number] around [redacted] weeks gestation with a hx of Asthma who presents with asthma exacerbation x 3-4 days. On exam, pt in NAD. Nontoxic/nonseptic appearing. VS with tachycardia. Afebrile. Lungs with bilateral wheeze. Heart RRR. Abdomen nontender soft. Given duoneb and prednisone in ED with significant improvement. O2 Sat >96 oNRA. Do not suspect flu as pt afebrile. Likely viral URI exacerbating asthma. Plan is to DC home with prednisone burst and follow up to PCP. At time of discharge, Patient is in no acute distress. Vital Signs are stable. Patient is able to ambulate. Patient able to tolerate PO.   Disposition/Plan:  DC Home Additional Verbal discharge instructions given and discussed with patient.  Pt Instructed to f/u with PCP in the next week for evaluation and treatment of symptoms. Return precautions given Pt acknowledges and agrees with  plan  Supervising Physician Gwyneth SproutWhitney Plunkett, MD  Final diagnoses:  Mild intermittent asthma with exacerbation    New Prescriptions New Prescriptions   No medications on file     Audry Piliyler Dezman Granda, PA-C 03/28/16 2102    Gwyneth SproutWhitney Plunkett, MD 03/29/16 2038

## 2016-03-28 NOTE — ED Notes (Signed)
Pt understood dc material. NAD noted. Script given at dc  

## 2016-03-28 NOTE — Discharge Instructions (Signed)
Please read and follow all provided instructions.  Your diagnoses today include:  1. Mild intermittent asthma with exacerbation     Tests performed today include: Vital signs. See below for your results today.   Medications prescribed:   Take any prescribed medications only as directed.  Home care instructions:  Follow any educational materials contained in this packet.  Follow-up instructions: Please follow-up with your primary care provider in the next 3 days for further evaluation of your symptoms and management of your asthma.  Return instructions:  Please return to the Emergency Department if you experience worsening symptoms. Please return with worsening wheezing, shortness of breath, or difficulty breathing. Return with persistent fever above 101F.  Please return if you have any other emergent concerns.  Additional Information:  Your vital signs today were: BP 125/77    Pulse (!) 121    Temp 98.3 F (36.8 C) (Oral)    Resp 21    Ht 5\' 4"  (1.626 m)    Wt 81.6 kg    LMP 09/02/2015    SpO2 94%    BMI 30.90 kg/m  If your blood pressure (BP) was elevated above 135/85 this visit, please have this repeated by your doctor within one month. --------------

## 2016-03-30 ENCOUNTER — Encounter (HOSPITAL_COMMUNITY): Payer: Self-pay

## 2016-03-30 ENCOUNTER — Ambulatory Visit (HOSPITAL_COMMUNITY)
Admission: RE | Admit: 2016-03-30 | Discharge: 2016-03-30 | Disposition: A | Discharge status: Home / Self Care | Payer: Medicaid Other | Source: Ambulatory Visit | Attending: Family Medicine | Admitting: Family Medicine

## 2016-03-30 DIAGNOSIS — F1010 Alcohol abuse, uncomplicated: Secondary | ICD-10-CM

## 2016-03-30 DIAGNOSIS — Z3A26 26 weeks gestation of pregnancy: Secondary | ICD-10-CM | POA: Insufficient documentation

## 2016-03-30 DIAGNOSIS — O99332 Smoking (tobacco) complicating pregnancy, second trimester: Secondary | ICD-10-CM | POA: Diagnosis not present

## 2016-03-30 DIAGNOSIS — Z0489 Encounter for examination and observation for other specified reasons: Secondary | ICD-10-CM

## 2016-03-30 DIAGNOSIS — O09292 Supervision of pregnancy with other poor reproductive or obstetric history, second trimester: Secondary | ICD-10-CM | POA: Diagnosis not present

## 2016-03-30 DIAGNOSIS — IMO0002 Reserved for concepts with insufficient information to code with codable children: Secondary | ICD-10-CM

## 2016-03-30 DIAGNOSIS — O99312 Alcohol use complicating pregnancy, second trimester: Secondary | ICD-10-CM | POA: Diagnosis not present

## 2016-03-30 DIAGNOSIS — O99310 Alcohol use complicating pregnancy, unspecified trimester: Secondary | ICD-10-CM

## 2016-03-30 DIAGNOSIS — F1099 Alcohol use, unspecified with unspecified alcohol-induced disorder: Secondary | ICD-10-CM | POA: Diagnosis not present

## 2016-03-30 NOTE — Addendum Note (Signed)
Encounter addended by: Jason FilaMesha T Kiasha Bellin on: 03/30/2016 12:38 PM<BR>    Actions taken: Imaging Exam ended

## 2016-03-31 ENCOUNTER — Inpatient Hospital Stay (HOSPITAL_COMMUNITY)
Admission: AD | Admit: 2016-03-31 | Discharge: 2016-03-31 | Disposition: A | Discharge status: Home / Self Care | Payer: Medicaid Other | Source: Ambulatory Visit | Attending: Obstetrics and Gynecology | Admitting: Obstetrics and Gynecology

## 2016-03-31 ENCOUNTER — Inpatient Hospital Stay (HOSPITAL_COMMUNITY): Payer: Medicaid Other

## 2016-03-31 ENCOUNTER — Encounter (HOSPITAL_COMMUNITY): Payer: Self-pay | Admitting: *Deleted

## 2016-03-31 DIAGNOSIS — Z3A26 26 weeks gestation of pregnancy: Secondary | ICD-10-CM | POA: Insufficient documentation

## 2016-03-31 DIAGNOSIS — O26893 Other specified pregnancy related conditions, third trimester: Secondary | ICD-10-CM

## 2016-03-31 DIAGNOSIS — R109 Unspecified abdominal pain: Secondary | ICD-10-CM | POA: Diagnosis not present

## 2016-03-31 DIAGNOSIS — O99512 Diseases of the respiratory system complicating pregnancy, second trimester: Secondary | ICD-10-CM | POA: Insufficient documentation

## 2016-03-31 DIAGNOSIS — O26892 Other specified pregnancy related conditions, second trimester: Secondary | ICD-10-CM | POA: Insufficient documentation

## 2016-03-31 DIAGNOSIS — R05 Cough: Secondary | ICD-10-CM

## 2016-03-31 DIAGNOSIS — Z87891 Personal history of nicotine dependence: Secondary | ICD-10-CM | POA: Insufficient documentation

## 2016-03-31 DIAGNOSIS — R059 Cough, unspecified: Secondary | ICD-10-CM

## 2016-03-31 DIAGNOSIS — J4521 Mild intermittent asthma with (acute) exacerbation: Secondary | ICD-10-CM | POA: Diagnosis not present

## 2016-03-31 DIAGNOSIS — Z7982 Long term (current) use of aspirin: Secondary | ICD-10-CM | POA: Diagnosis not present

## 2016-03-31 LAB — CMV IGM: CMV IgM: <30 AU/mL (ref 0.0–29.9)

## 2016-03-31 LAB — URINALYSIS, ROUTINE W REFLEX MICROSCOPIC
BILIRUBIN URINE: NEGATIVE
GLUCOSE, UA: NEGATIVE mg/dL
Ketones, ur: NEGATIVE mg/dL
Nitrite: NEGATIVE
PH: 6 (ref 5.0–8.0)
Protein, ur: NEGATIVE mg/dL
SPECIFIC GRAVITY, URINE: 1.015 (ref 1.005–1.030)

## 2016-03-31 LAB — TOXOPLASMA ANTIBODIES- IGG AND  IGM: Toxoplasma IgG Ratio: <3 IU/mL (ref 0.0–7.1)

## 2016-03-31 LAB — CBC WITH DIFFERENTIAL/PLATELET
Basophils Absolute: 0 10*3/uL (ref 0.0–0.1)
Basophils Relative: 0 %
EOS ABS: 0 10*3/uL (ref 0.0–0.7)
Eosinophils Relative: 0 %
HEMATOCRIT: 31.3 % — AB (ref 36.0–46.0)
HEMOGLOBIN: 10.9 g/dL — AB (ref 12.0–15.0)
LYMPHS ABS: 1.6 10*3/uL (ref 0.7–4.0)
Lymphocytes Relative: 22 %
MCH: 32.9 pg (ref 26.0–34.0)
MCHC: 34.8 g/dL (ref 30.0–36.0)
MCV: 94.6 fL (ref 78.0–100.0)
MONO ABS: 0.4 10*3/uL (ref 0.1–1.0)
MONOS PCT: 5 %
NEUTROS ABS: 5.5 10*3/uL (ref 1.7–7.7)
NEUTROS PCT: 73 %
Platelets: 225 10*3/uL (ref 150–400)
RBC: 3.31 MIL/uL — ABNORMAL LOW (ref 3.87–5.11)
RDW: 14 % (ref 11.5–15.5)
WBC: 7.6 10*3/uL (ref 4.0–10.5)

## 2016-03-31 LAB — CMV ANTIBODY, IGG (EIA): CMV AB - IGG: 3.3 U/mL — AB (ref 0.00–0.59)

## 2016-03-31 MED ORDER — CYCLOBENZAPRINE HCL 10 MG PO TABS: 5.0000 mg | ORAL_TABLET | Freq: Three times a day (TID) | ORAL | 0 refills | Status: DC | PRN

## 2016-03-31 MED ORDER — BENZONATATE 100 MG PO CAPS
200.0000 mg | ORAL_CAPSULE | Freq: Three times a day (TID) | ORAL | Status: DC | PRN
  Administered 2016-03-31: 200 mg via ORAL
  Filled 2016-03-31 (×2): qty 2

## 2016-03-31 MED ORDER — PSEUDOEPHEDRINE HCL 60 MG PO TABS: 60.0000 mg | ORAL_TABLET | ORAL | 0 refills | Status: DC | PRN

## 2016-03-31 MED ORDER — BENZONATATE 200 MG PO CAPS: 200.0000 mg | ORAL_CAPSULE | Freq: Three times a day (TID) | ORAL | 0 refills | Status: DC | PRN

## 2016-03-31 MED ORDER — GUAIFENESIN ER 600 MG PO TB12: 1200.0000 mg | ORAL_TABLET | Freq: Two times a day (BID) | ORAL | 1 refills | Status: DC | PRN

## 2016-03-31 NOTE — MAU Note (Signed)
States she has had a cold and has been coughing. States around 0200, she was coughing and she felt a sharp pain in her R mid to upper abdomen/side. Thinks she pulled a muscle.

## 2016-03-31 NOTE — Discharge Instructions (Signed)
Abdominal Pain During Pregnancy Abdominal pain is common in pregnancy. Most of the time, it does not cause harm. There are many causes of abdominal pain. Some causes are more serious than others and sometimes the cause is not known. Abdominal pain can be a sign that something is very wrong with the pregnancy or the pain may have nothing to do with the pregnancy. Always tell your health care provider if you have any abdominal pain. Follow these instructions at home:  Do not have sex or put anything in your vagina until your symptoms go away completely.  Watch your abdominal pain for any changes.  Get plenty of rest until your pain improves.  Drink enough fluid to keep your urine clear or pale yellow.  Take over-the-counter or prescription medicines only as told by your health care provider.  Keep all follow-up visits as told by your health care provider. This is important. Contact a health care provider if:  You have a fever.  Your pain gets worse or you have cramping.  Your pain continues after resting. Get help right away if:  You are bleeding, leaking fluid, or passing tissue from the vagina.  You have vomiting or diarrhea that does not go away.  You have painful or bloody urination.  You notice a decrease in your baby's movements.  You feel very weak or faint.  You have shortness of breath.  You develop a severe headache with abdominal pain.  You have abnormal vaginal discharge with abdominal pain. This information is not intended to replace advice given to you by your health care provider. Make sure you discuss any questions you have with your health care provider. Document Released: 03/30/2005 Document Revised: 01/09/2016 Document Reviewed: 10/27/2012 Elsevier Interactive Patient Education  2017 Elsevier Inc.   Cough, Adult Coughing is a reflex that clears your throat and your airways. Coughing helps to heal and protect your lungs. It is normal to cough  occasionally, but a cough that happens with other symptoms or lasts a long time may be a sign of a condition that needs treatment. A cough may last only 2-3 weeks (acute), or it may last longer than 8 weeks (chronic). What are the causes? Coughing is commonly caused by:  Breathing in substances that irritate your lungs.  A viral or bacterial respiratory infection.  Allergies.  Asthma.  Postnasal drip.  Smoking.  Acid backing up from the stomach into the esophagus (gastroesophageal reflux).  Certain medicines.  Chronic lung problems, including COPD (or rarely, lung cancer).  Other medical conditions such as heart failure. Follow these instructions at home: Pay attention to any changes in your symptoms. Take these actions to help with your discomfort:  Take medicines only as told by your health care provider.  If you were prescribed an antibiotic medicine, take it as told by your health care provider. Do not stop taking the antibiotic even if you start to feel better.  Talk with your health care provider before you take a cough suppressant medicine.  Drink enough fluid to keep your urine clear or pale yellow.  If the air is dry, use a cold steam vaporizer or humidifier in your bedroom or your home to help loosen secretions.  Avoid anything that causes you to cough at work or at home.  If your cough is worse at night, try sleeping in a semi-upright position.  Avoid cigarette smoke. If you smoke, quit smoking. If you need help quitting, ask your health care provider.  Avoid caffeine.  Avoid alcohol.  Rest as needed. Contact a health care provider if:  You have new symptoms.  You cough up pus.  Your cough does not get better after 2-3 weeks, or your cough gets worse.  You cannot control your cough with suppressant medicines and you are losing sleep.  You develop pain that is getting worse or pain that is not controlled with pain medicines.  You have a  fever.  You have unexplained weight loss.  You have night sweats. Get help right away if:  You cough up blood.  You have difficulty breathing.  Your heartbeat is very fast. This information is not intended to replace advice given to you by your health care provider. Make sure you discuss any questions you have with your health care provider. Document Released: 09/26/2010 Document Revised: 09/05/2015 Document Reviewed: 06/06/2014 Elsevier Interactive Patient Education  2017 Elsevier Inc.   Asthma, Acute Bronchospasm Acute bronchospasm caused by asthma is also referred to as an asthma attack. Bronchospasm means your air passages become narrowed. The narrowing is caused by inflammation and tightening of the muscles in the air tubes (bronchi) in your lungs. This can make it hard to breathe or cause you to wheeze and cough. What are the causes? Possible triggers are:  Animal dander from the skin, hair, or feathers of animals.  Dust mites contained in house dust.  Cockroaches.  Pollen from trees or grass.  Mold.  Cigarette or tobacco smoke.  Air pollutants such as dust, household cleaners, hair sprays, aerosol sprays, paint fumes, strong chemicals, or strong odors.  Cold air or weather changes. Cold air may trigger inflammation. Winds increase molds and pollens in the air.  Strong emotions such as crying or laughing hard.  Stress.  Certain medicines such as aspirin or beta-blockers.  Sulfites in foods and drinks, such as dried fruits and wine.  Infections or inflammatory conditions, such as a flu, cold, or inflammation of the nasal membranes (rhinitis).  Gastroesophageal reflux disease (GERD). GERD is a condition where stomach acid backs up into your esophagus.  Exercise or strenuous activity. What are the signs or symptoms?  Wheezing.  Excessive coughing, particularly at night.  Chest tightness.  Shortness of breath. How is this diagnosed? Your health care  provider will ask you about your medical history and perform a physical exam. A chest X-ray or blood testing may be performed to look for other causes of your symptoms or other conditions that may have triggered your asthma attack. How is this treated? Treatment is aimed at reducing inflammation and opening up the airways in your lungs. Most asthma attacks are treated with inhaled medicines. These include quick relief or rescue medicines (such as bronchodilators) and controller medicines (such as inhaled corticosteroids). These medicines are sometimes given through an inhaler or a nebulizer. Systemic steroid medicine taken by mouth or given through an IV tube also can be used to reduce the inflammation when an attack is moderate or severe. Antibiotic medicines are only used if a bacterial infection is present. Follow these instructions at home:  Rest.  Drink plenty of liquids. This helps the mucus to remain thin and be easily coughed up. Only use caffeine in moderation and do not use alcohol until you have recovered from your illness.  Do not smoke. Avoid being exposed to secondhand smoke.  You play a critical role in keeping yourself in good health. Avoid exposure to things that cause you to wheeze or to have breathing problems.  Keep your medicines  up-to-date and available. Carefully follow your health care providers treatment plan.  Take your medicine exactly as prescribed.  When pollen or pollution is bad, keep windows closed and use an air conditioner or go to places with air conditioning.  Asthma requires careful medical care. See your health care provider for a follow-up as advised. If you are more than [redacted] weeks pregnant and you were prescribed any new medicines, let your obstetrician know about the visit and how you are doing. Follow up with your health care provider as directed.  After you have recovered from your asthma attack, make an appointment with your outpatient doctor to talk  about ways to reduce the likelihood of future attacks. If you do not have a doctor who manages your asthma, make an appointment with a primary care doctor to discuss your asthma. Get help right away if:  You are getting worse.  You have trouble breathing. If severe, call your local emergency services (911 in the [place]  You develop chest pain or discomfort.  You are vomiting.  You are not able to keep fluids down.  You are coughing up yellow, green, brown, or bloody sputum.  You have a fever and your symptoms suddenly get worse.  You have trouble swallowing. This information is not intended to replace advice given to you by your health care provider. Make sure you discuss any questions you have with your health care provider. Document Released: 07/15/2006 Document Revised: 09/11/2015 Document Reviewed: 10/05/2012 Elsevier Interactive Patient Education  2017 [employer]

## 2016-03-31 NOTE — MAU Provider Note (Signed)
Chief Complaint:  Abdominal Pain   First Provider Initiated Contact with Patient 03/31/16 0902      HPI: Jordan Hall is a 30 y.o. [id number] at 4267w1d pt of [facility] who is high risk pregnancy with hx stillbirth at 36 weeks and ETOH who presents to maternity admissions reporting onset of severe right lower/mid abdominal pain after coughing this morning. She has respiratory infection exacerbating her asthma, and was seen in the ED and given a nebulizer breathing treatment on 12/16 and sent home with prednisone PO.  She is using her rescue inhaler at least twice per day during her illness but usually does not use it at all and does not require any maintenance medications for her mild intermittent asthma.  She reports that at 2 am this morning, she coughed and rolled over in bed and felt a sharp pain in her right mid abdomen. The pain is still there but is mild and constant when she is not coughing but severe and sharp when she coughs. She is taking Tylenol Cold and the prednisone prescribed for her asthma exacerbation but it is not helping. She has not taken anything or tried any treatments for her pain but came in to be evaluated.  She reports good fetal movement, denies LOF, vaginal bleeding, vaginal itching/burning, urinary symptoms, h/a, dizziness, n/v, or fever/chills.    HPI  Past Medical History: Past Medical History:  Diagnosis Date  . Abnormal Pap smear   . Asthma   . Hx of chlamydia infection     Past obstetric history: OB History  Gravida Para Term Preterm AB Living  7 3 2 1 3 2   SAB TAB Ectopic Multiple Live Births  2 0 0 0 2    # Outcome Date GA Lbr Len/2nd Weight Sex Delivery Anes PTL Lv  7 Current           6 AB 05/07/15    U      5 Preterm 06/01/13 1045w6d  6 lb 4 oz (2.835 kg) F Vag-Spont None  FD  4 Term 06/18/12 4634w3d 06:19 / 00:09 7 lb 7 oz (3.374 kg) M Vag-Spont EPI  LIV  3 SAB 2010          2 Term 2006 8951w2d  10 lb 5 oz (4.678 kg) F Vag-Spont None  LIV  1 SAB 2005               Past Surgical History: Past Surgical History:  Procedure Laterality Date  . COLPOSCOPY      Family History: Family History  Problem Relation Age of Onset  . Asthma Daughter   . Hypertension Mother   . Thyroid disease Mother   . Hypertension Maternal Aunt   . Hypertension Maternal Grandmother   . Thyroid disease Maternal Grandmother   . Heart disease Father   . Seizures Father     Social History: Social History  Substance Use Topics  . Smoking status: Former Smoker    Packs/day: 0.25    Types: Cigarettes    Quit date: 11/21/2015  . Smokeless tobacco: Never Used  . Alcohol use Yes     Comment: occasionally until pregnancy    Allergies:  Allergies  Allergen Reactions  . Latex Other (See Comments)    Bacterial or yeast infection from condoms    Meds:  Prescriptions Prior to Admission  Medication Sig Dispense Refill Last Dose  . acetaminophen (TYLENOL) 160 MG/5ML solution Take 480 mg by mouth every 6 (six) hours as  needed for moderate pain.   03/31/2016 at Unknown time  . albuterol (PROVENTIL HFA;VENTOLIN HFA) 108 (90 Base) MCG/ACT inhaler Inhale 2 puffs into the lungs every 4 (four) hours as needed for wheezing or shortness of breath. 1 Inhaler 0 03/31/2016 at Unknown time  . aspirin EC 81 MG tablet Take 81 mg by mouth daily.   03/30/2016 at Unknown time  . predniSONE (DELTASONE) 20 MG tablet Take 3 tablets (60 mg total) by mouth daily. 15 tablet 0 03/30/2016 at Unknown time  . Prenatal Vit-Fe Fumarate-FA (PRENATAL VITAMIN PO) Take 1 tablet by mouth daily.    03/30/2016 at Unknown time    ROS:  Review of Systems  Constitutional: Negative for chills, fatigue and fever.  Respiratory: Negative for shortness of breath.   Cardiovascular: Negative for chest pain.  Gastrointestinal: Positive for abdominal pain. Negative for nausea and vomiting.  Genitourinary: Negative for difficulty urinating, dysuria, flank pain, pelvic pain, vaginal bleeding, vaginal discharge  and vaginal pain.  Neurological: Negative for dizziness and headaches.  Psychiatric/Behavioral: Negative.      I have reviewed patient's Past Medical Hx, Surgical Hx, Family Hx, Social Hx, medications and allergies.   Physical Exam   Patient Vitals for the past 24 hrs:  BP Temp Temp src Pulse Resp SpO2  03/31/16 0927 - - - 81 - 97 %  03/31/16 0902 - - - 98 - 96 %  03/31/16 0852 - - - 101 - 96 %  03/31/16 0842 - - - 88 - 95 %  03/31/16 0837 - - - 85 - 95 %  03/31/16 0832 - - - 90 - 95 %  03/31/16 0823 - - - 88 - -  03/31/16 0822 123/74 98.7 F (37.1 C) Oral 87 20 95 %   Constitutional: Well-developed, well-nourished female in no acute distress.  Cardiovascular: normal rate Respiratory: normal effort GI: Abd soft, non-tender, gravid appropriate for gestational age.  MS: Extremities nontender, no edema, normal ROM Neurologic: Alert and oriented x 4.  GU: Neg CVAT.    FHT:  Baseline 145 , moderate variability, accelerations present, no decelerations Contractions: None on toco or to palpation   Labs: Results for orders placed or performed during the hospital encounter of 03/31/16 (from the past 24 hour(s))  CBC with Differential     Status: Abnormal   Collection Time: 03/31/16  9:20 AM  Result Value Ref Range   WBC 7.6 4.0 - 10.5 K/uL   RBC 3.31 (L) 3.87 - 5.11 MIL/uL   Hemoglobin 10.9 (L) 12.0 - 15.0 g/dL   HCT 16.131.3 (L) 09.636.0 - 04.546.0 %   MCV 94.6 78.0 - 100.0 fL   MCH 32.9 26.0 - 34.0 pg   MCHC 34.8 30.0 - 36.0 g/dL   RDW 40.914.0 81.111.5 - 91.415.5 %   Platelets 225 150 - 400 K/uL   Neutrophils Relative % 73 %   Neutro Abs 5.5 1.7 - 7.7 K/uL   Lymphocytes Relative 22 %   Lymphs Abs 1.6 0.7 - 4.0 K/uL   Monocytes Relative 5 %   Monocytes Absolute 0.4 0.1 - 1.0 K/uL   Eosinophils Relative 0 %   Eosinophils Absolute 0.0 0.0 - 0.7 K/uL   Basophils Relative 0 %   Basophils Absolute 0.0 0.0 - 0.1 K/uL  Urinalysis, Routine w reflex microscopic     Status: Abnormal   Collection  Time: 03/31/16 10:25 AM  Result Value Ref Range   Color, Urine YELLOW YELLOW   APPearance HAZY (A) CLEAR  Specific Gravity, Urine 1.015 1.005 - 1.030   pH 6.0 5.0 - 8.0   Glucose, UA NEGATIVE NEGATIVE mg/dL   Hgb urine dipstick SMALL (A) NEGATIVE   Bilirubin Urine NEGATIVE NEGATIVE   Ketones, ur NEGATIVE NEGATIVE mg/dL   Protein, ur NEGATIVE NEGATIVE mg/dL   Nitrite NEGATIVE NEGATIVE   Leukocytes, UA TRACE (A) NEGATIVE   RBC / HPF 0-5 0 - 5 RBC/hpf   WBC, UA 0-5 0 - 5 WBC/hpf   Bacteria, UA RARE (A) NONE SEEN   Squamous Epithelial / LPF 0-5 (A) NONE SEEN   Mucous PRESENT    --/--/O POS (08/06 2149)  Imaging:  MFM OB Limited US findings with normal FHR, fluid level, and placenta  MAU Course/MDM: I have ordered labs and reviewed results.  NST reviewed Consult Dr Vergie Living with presentation, exam findings and test results. Discussed pain management for likely musculoskeletal pain.  Flexeril 5-10 mg TID PRN. Treat cough with Tessalon Perles, Mucinex, Sudafed for postnasal drip.  Treatments in MAU included Tessalon Perles 200 mg x 1 dose.   Abdominal pain in pregnancy precautions reviewed/reasons to return to MAU. Also reviewed asthma precautions/reasons to return to MAU or ED. Pt stable at time of discharge.   Assessment: 1. Abdominal pain during pregnancy in second trimester   2. Abdominal pain during pregnancy in third trimester   3. Cough   4. Mild intermittent asthma with acute exacerbation     Plan: Discharge home Preterm labor precautions and fetal kick counts Follow-up Information    [facility].   Specialty:  Obstetrics and Gynecology Why:  As schedule, return to MAU as needed for emergencies Contact information: [address] [place] [address] [phone]         Allergies as of 03/31/2016      Reactions   Latex Other (See Comments)   Bacterial or yeast infection from condoms      Medication List     TAKE these medications   acetaminophen 160 MG/5ML solution Commonly known as:  TYLENOL Take 480 mg by mouth every 6 (six) hours as needed for moderate pain.   albuterol 108 (90 Base) MCG/ACT inhaler Commonly known as:  PROVENTIL HFA;VENTOLIN HFA Inhale 2 puffs into the lungs every 4 (four) hours as needed for wheezing or shortness of breath.   aspirin EC 81 MG tablet Take 81 mg by mouth daily.   benzonatate 200 MG capsule Commonly known as:  TESSALON Take 1 capsule (200 mg total) by mouth 3 (three) times daily as needed for cough.   cyclobenzaprine 10 MG tablet Commonly known as:  FLEXERIL Take 0.5-1 tablets (5-10 mg total) by mouth 3 (three) times daily as needed for muscle spasms.   guaiFENesin 600 MG 12 hr tablet Commonly known as:  MUCINEX Take 2 tablets (1,200 mg total) by mouth 2 (two) times daily as needed.   predniSONE 20 MG tablet Commonly known as:  DELTASONE Take 3 tablets (60 mg total) by mouth daily.   PRENATAL VITAMIN PO Take 1 tablet by mouth daily.   pseudoephedrine 60 MG tablet Commonly known as:  SUDAFED Take 1 tablet (60 mg total) by mouth every 4 (four) hours as needed for congestion.       Sharen Counter Certified Nurse-Midwife 03/31/2016 11:23 AM

## 2016-04-13 NOTE — L&D Delivery Note (Signed)
   Delivery Note Jordan Hall progressed quickly through labor and soon after receiving her epidural, had an urge to push.  After a 3 contractions 2nd stage, at 9:20 PM a viable female was delivered via Vaginal, Spontaneous Delivery (Presentation: LOA ).  APGAR: 7, 9; weight 7 lb 9.9 oz (3455 g).  After 1 minute, the cord was clamped and cut. 40 units of pitocin diluted in 1000cc LR was infused rapidly IV.  The placenta separated spontaneously and delivered via CCT and maternal pushing effort.  It was inspected and appears to be intact with a 3 VC.    Anesthesia: epidural   Episiotomy: None Lacerations: None Suture Repair:  Est. Blood Loss (mL): 100  Mom to postpartum.  Baby to Couplet care / Skin to Skin.  CRESENZO-DISHMAN,Gareld Obrecht 06/18/2016, 10:35 PM

## 2016-04-17 ENCOUNTER — Encounter: Payer: Self-pay | Admitting: Family Medicine

## 2016-04-20 ENCOUNTER — Other Ambulatory Visit: Payer: Self-pay

## 2016-04-20 ENCOUNTER — Ambulatory Visit (INDEPENDENT_AMBULATORY_CARE_PROVIDER_SITE_OTHER): Payer: Medicaid Other | Admitting: Obstetrics and Gynecology

## 2016-04-20 ENCOUNTER — Encounter: Payer: Self-pay | Admitting: *Deleted

## 2016-04-20 VITALS — BP 119/77 | HR 96 | Wt 185.4 lb

## 2016-04-20 DIAGNOSIS — Z23 Encounter for immunization: Secondary | ICD-10-CM | POA: Diagnosis present

## 2016-04-20 DIAGNOSIS — O0990 Supervision of high risk pregnancy, unspecified, unspecified trimester: Secondary | ICD-10-CM

## 2016-04-20 DIAGNOSIS — O99513 Diseases of the respiratory system complicating pregnancy, third trimester: Secondary | ICD-10-CM

## 2016-04-20 DIAGNOSIS — O09299 Supervision of pregnancy with other poor reproductive or obstetric history, unspecified trimester: Secondary | ICD-10-CM

## 2016-04-20 DIAGNOSIS — O09293 Supervision of pregnancy with other poor reproductive or obstetric history, third trimester: Secondary | ICD-10-CM

## 2016-04-20 DIAGNOSIS — J45909 Unspecified asthma, uncomplicated: Secondary | ICD-10-CM

## 2016-04-20 DIAGNOSIS — O99519 Diseases of the respiratory system complicating pregnancy, unspecified trimester: Secondary | ICD-10-CM

## 2016-04-20 DIAGNOSIS — N898 Other specified noninflammatory disorders of vagina: Secondary | ICD-10-CM

## 2016-04-20 DIAGNOSIS — Z3009 Encounter for other general counseling and advice on contraception: Secondary | ICD-10-CM

## 2016-04-20 LAB — CBC
HEMATOCRIT: 37.9 % (ref 35.0–45.0)
HEMOGLOBIN: 12.3 g/dL (ref 11.7–15.5)
MCH: 31.8 pg (ref 27.0–33.0)
MCHC: 32.5 g/dL (ref 32.0–36.0)
MCV: 97.9 fL (ref 80.0–100.0)
MPV: 10.1 fL (ref 7.5–12.5)
Platelets: 290 10*3/uL (ref 140–400)
RBC: 3.87 MIL/uL (ref 3.80–5.10)
RDW: 13.6 % (ref 11.0–15.0)
WBC: 8.1 10*3/uL (ref 3.8–10.8)

## 2016-04-20 NOTE — Progress Notes (Signed)
Subjective:  Jordan Hall is a 31 y.o. [id number] at 3945w0d being seen today for ongoing prenatal care.  She is currently monitored for the following issues for this high-risk pregnancy and has H/O macrosomia in infant in prior pregnancy, currently pregnant; Tobacco smoking affecting pregnancy; History of term stillbirth; Supervision of high risk pregnancy, antepartum; Asthma affecting pregnancy, antepartum; Alcohol abuse affecting pregnancy; and Unwanted fertility on her problem list.  Patient reports no complaints.  Contractions: Irritability. Vag. Bleeding: None.  Movement: Present. Denies leaking of fluid.   The following portions of the patient's history were reviewed and updated as appropriate: allergies, current medications, past family history, past medical history, past social history, past surgical history and problem list. Problem list updated.  Objective:   Vitals:   04/20/16 0804  BP: 119/77  Pulse: 96  Weight: 185 lb 6.4 oz (84.1 kg)    Fetal Status: Fetal Heart Rate (bpm): 145   Movement: Present     General:  Alert, oriented and cooperative. Patient is in no acute distress.  Skin: Skin is warm and dry. No rash noted.   Cardiovascular: Normal heart rate noted  Respiratory: Normal respiratory effort, no problems with respiration noted  Abdomen: Soft, gravid, appropriate for gestational age.       Pelvic:  Cervical exam deferred        Extremities: Normal range of motion.  Edema: None  Mental Status: Normal mood and affect. Normal behavior. Normal judgment and thought content.   Urinalysis:      Assessment and Plan:  Pregnancy: [id number] at 6745w0d  1. Supervision of high risk pregnancy, antepartum  - CBC - RPR - HIV antibody (with reflex) - 2Hr GTT w/ 1 Hr Carpenter 75 g - Tdap vaccine greater than or equal to 7yo IM  2. Asthma affecting pregnancy, antepartum Uses MDI PRN - CBC - RPR - HIV antibody (with reflex) - 2Hr GTT w/ 1 Hr Carpenter 75 g  3. H/O macrosomia  in infant in prior pregnancy, currently pregnant F/U U/S next week - CBC - RPR - HIV antibody (with reflex) - 2Hr GTT w/ 1 Hr Carpenter 75 g  4. Need for Tdap vaccination  - Tdap vaccine greater than or equal to 7yo IM  5. Unwanted fertility BTL papers signed today  6. H/O stillbirth Negative work up Continue with [employer] Start twice weekly testing next visit IOL at 39 weeks Preterm labor symptoms and general obstetric precautions including but not limited to vaginal bleeding, contractions, leaking of fluid and fetal movement were reviewed in detail with the patient. Please refer to After Visit Summary for other counseling recommendations.  Return in about 2 weeks (around 05/04/2016).   Hermina StaggersMichael L Ayansh Feutz, MD

## 2016-04-20 NOTE — Progress Notes (Signed)
Pt c/o vaginal odor 28 week labs/tdap today

## 2016-04-21 LAB — 2HR GTT W 1 HR, CARPENTER, 75 G
GLUCOSE, 1 HR, GEST: 148 mg/dL (ref <180)
GLUCOSE, 2 HR, GEST: 84 mg/dL (ref <153)
GLUCOSE, FASTING, GEST: 97 mg/dL — AB (ref 65–91)

## 2016-04-21 LAB — HIV ANTIBODY (ROUTINE TESTING W REFLEX): HIV 1&2 Ab, 4th Generation: NONREACTIVE

## 2016-04-21 LAB — WET PREP, GENITAL: Trich, Wet Prep: NONE SEEN

## 2016-04-21 LAB — RPR

## 2016-04-22 ENCOUNTER — Telehealth: Payer: Self-pay | Admitting: General Practice

## 2016-04-22 DIAGNOSIS — B379 Candidiasis, unspecified: Secondary | ICD-10-CM

## 2016-04-22 DIAGNOSIS — B9689 Other specified bacterial agents as the cause of diseases classified elsewhere: Secondary | ICD-10-CM

## 2016-04-22 DIAGNOSIS — N760 Acute vaginitis: Secondary | ICD-10-CM

## 2016-04-22 DIAGNOSIS — O24410 Gestational diabetes mellitus in pregnancy, diet controlled: Secondary | ICD-10-CM

## 2016-04-22 MED ORDER — TERCONAZOLE 0.4 % VA CREA: 1.0000 | TOPICAL_CREAM | Freq: Every day | VAGINAL | 0 refills | Status: DC

## 2016-04-22 MED ORDER — METRONIDAZOLE 500 MG PO TABS: 500.0000 mg | ORAL_TABLET | Freq: Two times a day (BID) | ORAL | 0 refills | Status: DC

## 2016-04-22 NOTE — Telephone Encounter (Signed)
Patient called into front office requesting results. Informed patient of diabetes & scheduled education appt for her in MFM 1/18 @ 4pm. Patient verbalized understanding & asked if she could repeat the test because she has never had this before. Told patient that she could but her body can change with each pregnancy. Patient verbalized understanding and asked about wet prep results. Informed patient of results and asked if she was experiencing any symptoms. Patient states she is having odor and irritation. Informed patient of two meds sent to pharmacy. Patient verbalized understanding & had no questions

## 2016-04-24 ENCOUNTER — Encounter (HOSPITAL_COMMUNITY): Payer: Self-pay

## 2016-04-26 ENCOUNTER — Inpatient Hospital Stay (HOSPITAL_COMMUNITY)
Admission: AD | Admit: 2016-04-26 | Discharge: 2016-04-26 | Disposition: A | Discharge status: Home / Self Care | Payer: Medicaid Other | Source: Ambulatory Visit | Attending: Obstetrics & Gynecology | Admitting: Obstetrics & Gynecology

## 2016-04-26 ENCOUNTER — Encounter (HOSPITAL_COMMUNITY): Payer: Self-pay | Admitting: *Deleted

## 2016-04-26 DIAGNOSIS — R109 Unspecified abdominal pain: Secondary | ICD-10-CM | POA: Insufficient documentation

## 2016-04-26 DIAGNOSIS — Z3A29 29 weeks gestation of pregnancy: Secondary | ICD-10-CM | POA: Insufficient documentation

## 2016-04-26 DIAGNOSIS — R197 Diarrhea, unspecified: Secondary | ICD-10-CM | POA: Diagnosis not present

## 2016-04-26 DIAGNOSIS — O219 Vomiting of pregnancy, unspecified: Secondary | ICD-10-CM | POA: Diagnosis not present

## 2016-04-26 DIAGNOSIS — O26893 Other specified pregnancy related conditions, third trimester: Secondary | ICD-10-CM | POA: Insufficient documentation

## 2016-04-26 DIAGNOSIS — A084 Viral intestinal infection, unspecified: Secondary | ICD-10-CM | POA: Diagnosis not present

## 2016-04-26 MED ORDER — FAMOTIDINE 20 MG PO TABS
40.0000 mg | ORAL_TABLET | Freq: Once | ORAL | Status: AC
  Administered 2016-04-26: 40 mg via ORAL
  Filled 2016-04-26: qty 2

## 2016-04-26 MED ORDER — ONDANSETRON 8 MG PO TBDP: 8.0000 mg | ORAL_TABLET | Freq: Three times a day (TID) | ORAL | 0 refills | Status: DC | PRN

## 2016-04-26 MED ORDER — ONDANSETRON 8 MG PO TBDP
8.0000 mg | ORAL_TABLET | Freq: Once | ORAL | Status: AC
  Administered 2016-04-26: 8 mg via ORAL
  Filled 2016-04-26: qty 1

## 2016-04-26 NOTE — Discharge Instructions (Signed)

## 2016-04-26 NOTE — MAU Provider Note (Signed)
History    [id number] @ 29.6 wks in with c/o nausea, vomiting and diarrhea since 0400 this morning. abd cramping with diarrhea. Denies vag bleediong, or ROM. CSN: [id number]  Arrival date & time 04/26/16  2121   None     No chief complaint on file.   HPI  Past Medical History:  Diagnosis Date  . Abnormal Pap smear   . Asthma   . Hx of chlamydia infection     Past Surgical History:  Procedure Laterality Date  . COLPOSCOPY      Family History  Problem Relation Age of Onset  . Asthma Daughter   . Hypertension Mother   . Thyroid disease Mother   . Hypertension Maternal Aunt   . Hypertension Maternal Grandmother   . Thyroid disease Maternal Grandmother   . Heart disease Father   . Seizures Father     Social History  Substance Use Topics  . Smoking status: Former Smoker    Packs/day: 0.25    Types: Cigarettes    Quit date: 11/21/2015  . Smokeless tobacco: Never Used  . Alcohol use Yes     Comment: occasionally until pregnancy    OB History    Gravida Para Term Preterm AB Living   7 3 2 1 3 2    SAB TAB Ectopic Multiple Live Births   2 0 0 0 2      Review of Systems  Constitutional: Negative.   HENT: Negative.   Eyes: Negative.   Respiratory: Negative.   Gastrointestinal: Positive for abdominal pain, diarrhea, nausea and vomiting.  Endocrine: Negative.   Genitourinary: Negative.   Musculoskeletal: Negative.   Skin: Negative.   Allergic/Immunologic: Negative.   Neurological: Negative.   Hematological: Negative.   Psychiatric/Behavioral: Negative.     Allergies  Latex  Home Medications    LMP 09/02/2015   Physical Exam  Constitutional: She is oriented to person, place, and time. She appears well-developed and well-nourished.  HENT:  Head: Normocephalic.  Eyes: Pupils are equal, round, and reactive to light.  Neck: Normal range of motion.  Cardiovascular: Normal rate, regular rhythm, normal heart sounds and intact distal pulses.    Pulmonary/Chest: Effort normal and breath sounds normal.  Abdominal: Soft. Bowel sounds are normal.  Genitourinary: Vagina normal and uterus normal.  Musculoskeletal: Normal range of motion.  Neurological: She is alert and oriented to person, place, and time. She has normal reflexes.  Skin: Skin is warm and dry.  Psychiatric: She has a normal mood and affect. Her behavior is normal. Judgment and thought content normal.    MAU Course  Procedures (including critical care time)  Labs Reviewed - No data to display No results found.   1. Viral gastroenteritis       MDM  SVE cl/post/th/high. FHR pattern reassuring. Rare mild uc's. Will treat with zofran if retains fluids will d/c home.

## 2016-04-27 ENCOUNTER — Ambulatory Visit (HOSPITAL_COMMUNITY): Payer: Self-pay

## 2016-04-28 ENCOUNTER — Encounter (HOSPITAL_COMMUNITY): Payer: Self-pay

## 2016-04-28 ENCOUNTER — Ambulatory Visit (HOSPITAL_COMMUNITY)
Admission: RE | Admit: 2016-04-28 | Discharge: 2016-04-28 | Disposition: A | Discharge status: Home / Self Care | Payer: Medicaid Other | Source: Ambulatory Visit | Attending: Family Medicine | Admitting: Family Medicine

## 2016-04-28 DIAGNOSIS — Z0489 Encounter for examination and observation for other specified reasons: Secondary | ICD-10-CM

## 2016-04-28 DIAGNOSIS — Z3A30 30 weeks gestation of pregnancy: Secondary | ICD-10-CM | POA: Diagnosis not present

## 2016-04-28 DIAGNOSIS — O99310 Alcohol use complicating pregnancy, unspecified trimester: Secondary | ICD-10-CM

## 2016-04-28 DIAGNOSIS — O09293 Supervision of pregnancy with other poor reproductive or obstetric history, third trimester: Secondary | ICD-10-CM | POA: Insufficient documentation

## 2016-04-28 DIAGNOSIS — IMO0002 Reserved for concepts with insufficient information to code with codable children: Secondary | ICD-10-CM

## 2016-04-28 DIAGNOSIS — F1099 Alcohol use, unspecified with unspecified alcohol-induced disorder: Secondary | ICD-10-CM | POA: Insufficient documentation

## 2016-04-28 DIAGNOSIS — O99313 Alcohol use complicating pregnancy, third trimester: Secondary | ICD-10-CM | POA: Diagnosis not present

## 2016-04-28 DIAGNOSIS — O99333 Smoking (tobacco) complicating pregnancy, third trimester: Secondary | ICD-10-CM | POA: Diagnosis not present

## 2016-04-28 DIAGNOSIS — F1010 Alcohol abuse, uncomplicated: Secondary | ICD-10-CM

## 2016-04-28 DIAGNOSIS — O09292 Supervision of pregnancy with other poor reproductive or obstetric history, second trimester: Secondary | ICD-10-CM

## 2016-04-28 NOTE — Addendum Note (Signed)
Encounter addended by: Marcellina MillinKelly L Kenyah Luba on: 04/28/2016 10:49 AM<BR>    Actions taken: Imaging Exam ended

## 2016-04-30 ENCOUNTER — Ambulatory Visit (HOSPITAL_COMMUNITY): Payer: Medicaid Other

## 2016-05-04 ENCOUNTER — Other Ambulatory Visit: Payer: Self-pay | Admitting: Family Medicine

## 2016-05-07 ENCOUNTER — Ambulatory Visit (INDEPENDENT_AMBULATORY_CARE_PROVIDER_SITE_OTHER): Payer: Medicaid Other | Admitting: *Deleted

## 2016-05-07 VITALS — BP 120/63 | HR 82

## 2016-05-07 DIAGNOSIS — Z8759 Personal history of other complications of pregnancy, childbirth and the puerperium: Secondary | ICD-10-CM

## 2016-05-07 DIAGNOSIS — O09293 Supervision of pregnancy with other poor reproductive or obstetric history, third trimester: Secondary | ICD-10-CM | POA: Diagnosis not present

## 2016-05-07 NOTE — Progress Notes (Signed)
Pt is aware of some UC's during NST but not all- denies pain.

## 2016-05-08 ENCOUNTER — Ambulatory Visit (HOSPITAL_COMMUNITY): Admission: RE | Admit: 2016-05-08 | Payer: Medicaid Other | Source: Ambulatory Visit

## 2016-05-11 ENCOUNTER — Encounter: Payer: Medicaid Other | Attending: Family Medicine | Admitting: *Deleted

## 2016-05-11 ENCOUNTER — Ambulatory Visit (INDEPENDENT_AMBULATORY_CARE_PROVIDER_SITE_OTHER): Payer: Medicaid Other | Admitting: Family Medicine

## 2016-05-11 ENCOUNTER — Ambulatory Visit: Payer: Medicaid Other | Admitting: *Deleted

## 2016-05-11 ENCOUNTER — Ambulatory Visit: Payer: Self-pay

## 2016-05-11 VITALS — BP 127/60 | HR 99 | Wt 195.0 lb

## 2016-05-11 DIAGNOSIS — O24410 Gestational diabetes mellitus in pregnancy, diet controlled: Secondary | ICD-10-CM

## 2016-05-11 DIAGNOSIS — Z3689 Encounter for other specified antenatal screening: Secondary | ICD-10-CM

## 2016-05-11 DIAGNOSIS — Z8759 Personal history of other complications of pregnancy, childbirth and the puerperium: Secondary | ICD-10-CM

## 2016-05-11 DIAGNOSIS — O24419 Gestational diabetes mellitus in pregnancy, unspecified control: Secondary | ICD-10-CM | POA: Insufficient documentation

## 2016-05-11 DIAGNOSIS — Z713 Dietary counseling and surveillance: Secondary | ICD-10-CM | POA: Diagnosis present

## 2016-05-11 DIAGNOSIS — O0990 Supervision of high risk pregnancy, unspecified, unspecified trimester: Secondary | ICD-10-CM

## 2016-05-11 MED ORDER — ACCU-CHEK NANO SMARTVIEW W/DEVICE KIT: 1.0000 | PACK | 0 refills | Status: DC

## 2016-05-11 MED ORDER — GLUCOSE BLOOD VI STRP: ORAL_STRIP | 12 refills | Status: DC

## 2016-05-11 MED ORDER — ACCU-CHEK FASTCLIX LANCETS MISC
1.0000 [IU] | Freq: Four times a day (QID) | 12 refills | Status: DC
Start: 2016-05-11 — End: 2016-06-20

## 2016-05-11 NOTE — Progress Notes (Signed)
  Patient was seen on 05/11/2016 for Gestational Diabetes self-management . The following learning objectives were met by the patient :   States the definition of Gestational Diabetes  States why dietary management is important in controlling blood glucose  Describes the effects of carbohydrates on blood glucose levels  Demonstrates ability to create a balanced meal plan  Demonstrates carbohydrate counting   States when to check blood glucose levels  Demonstrates proper blood glucose monitoring techniques  States the effect of stress and exercise on blood glucose levels  States the importance of limiting caffeine and abstaining from alcohol and smoking  Plan:  Aim for 3 Carb Choices per meal (45 grams) +/- 1 either way  Aim for 1-2 Carbs per snack Begin reading food labels for Total Carbohydrate of foods Consider  increasing your activity level by walking or other activity daily as tolerated Begin checking BG before breakfast and 2 hours after first bite of breakfast, lunch and dinner as directed by MD  Take medication if directed by MD  Patient has Rx to pick up a meter: Accu Chek today And knows to test pre breakfast and 2 hours each meal as directed by MD Log Book Sheet provided and explained to bring to every MD appointment  Patient instructed to monitor glucose levels: FBS: 60 - <90 2 hour: <120   Patient received the following handouts:  Nutrition Diabetes and Pregnancy  Carbohydrate Counting List  Patient will be seen for follow-up as needed.

## 2016-05-11 NOTE — Progress Notes (Signed)
Subjective:  Jordan Hall is a 31 y.o. [id number] at 3949w0d being seen today for ongoing prenatal care.  She is currently monitored for the following issues for this high-risk pregnancy and has H/O macrosomia in infant in prior pregnancy, currently pregnant; Tobacco smoking affecting pregnancy; History of term stillbirth; Supervision of high risk pregnancy, antepartum; Asthma affecting pregnancy, antepartum; Alcohol abuse affecting pregnancy; and Unwanted fertility on her problem list.  GDM: Patient just diagnosed. Does not have meter.She is questioning the diagnosis.  Patient reports no complaints.  Contractions: Irregular. Vag. Bleeding: None.  Movement: Present. Denies leaking of fluid.   The following portions of the patient's history were reviewed and updated as appropriate: allergies, current medications, past family history, past medical history, past social history, past surgical history and problem list. Problem list updated.  Objective:   Vitals:   05/11/16 0744  BP: 127/60  Pulse: 99  Weight: 195 lb (88.5 kg)    Fetal Status: Fetal Heart Rate (bpm): NST Fundal Height: 32 cm Movement: Present     General:  Alert, oriented and cooperative. Patient is in no acute distress.  Skin: Skin is warm and dry. No rash noted.   Cardiovascular: Normal heart rate noted  Respiratory: Normal respiratory effort, no problems with respiration noted  Abdomen: Soft, gravid, appropriate for gestational age. Pain/Pressure: Present     Pelvic: Vag. Bleeding: None     Cervical exam deferred        Extremities: Normal range of motion.  Edema: None  Mental Status: Normal mood and affect. Normal behavior. Normal judgment and thought content.   Urinalysis:      Assessment and Plan:  Pregnancy: [id number] at 2649w0d  1. Supervision of high risk pregnancy, antepartum FHT and FH normal. Discussed mode of delivery - with first baby, had >10# baby with episiotomy and shoulder dystocia. Had 2 vaginal deliveries  afterwards without complications (one was her 36 week IUFD). EFW ~60%. Recommended vaginal delivery.  2. History of term stillbirth NST reactive. Continue twice weekly testing - Fetal nonstress test - [place] OB Limited  3. GDM A1 Discussed results. Recommended testing CBGs. Pt going for DM education.  Preterm labor symptoms and general obstetric precautions including but not limited to vaginal bleeding, contractions, leaking of fluid and fetal movement were reviewed in detail with the patient. Please refer to After Visit Summary for other counseling recommendations.  Return in about 3 days (around 05/14/2016) for 2x/wk as scheduled.   Levie HeritageJacob J Stokes Rattigan, DO

## 2016-05-11 NOTE — Progress Notes (Addendum)
Pt informed that the ultrasound is considered a limited OB ultrasound and is not intended to be a complete ultrasound exam.  Patient also informed that the ultrasound is not being completed with the intent of assessing for fetal or placental anomalies or any pelvic abnormalities.  Explained that the purpose of today's ultrasound is to assess for presentation and amniotic fluid volume.  Patient acknowledges the purpose of the exam and the limitations of the study.    US for growth scheduled 2/12.

## 2016-05-12 ENCOUNTER — Other Ambulatory Visit: Payer: Self-pay

## 2016-05-12 MED ORDER — ACCU-CHEK AVIVA PLUS W/DEVICE KIT: 1.0000 | PACK | Freq: Four times a day (QID) | 0 refills | Status: DC

## 2016-05-14 ENCOUNTER — Ambulatory Visit (INDEPENDENT_AMBULATORY_CARE_PROVIDER_SITE_OTHER): Payer: Medicaid Other | Admitting: *Deleted

## 2016-05-14 VITALS — BP 110/65 | HR 95

## 2016-05-14 DIAGNOSIS — O24419 Gestational diabetes mellitus in pregnancy, unspecified control: Secondary | ICD-10-CM | POA: Diagnosis present

## 2016-05-14 DIAGNOSIS — Z8759 Personal history of other complications of pregnancy, childbirth and the puerperium: Secondary | ICD-10-CM

## 2016-05-18 ENCOUNTER — Other Ambulatory Visit: Payer: Self-pay | Admitting: Obstetrics & Gynecology

## 2016-05-18 ENCOUNTER — Other Ambulatory Visit: Payer: Self-pay | Admitting: Obstetrics and Gynecology

## 2016-05-19 ENCOUNTER — Ambulatory Visit: Payer: Self-pay

## 2016-05-19 ENCOUNTER — Ambulatory Visit (INDEPENDENT_AMBULATORY_CARE_PROVIDER_SITE_OTHER): Payer: Medicaid Other | Admitting: Medical

## 2016-05-19 VITALS — BP 123/64 | HR 90 | Wt 196.7 lb

## 2016-05-19 DIAGNOSIS — O24419 Gestational diabetes mellitus in pregnancy, unspecified control: Secondary | ICD-10-CM | POA: Diagnosis not present

## 2016-05-19 DIAGNOSIS — Z8759 Personal history of other complications of pregnancy, childbirth and the puerperium: Secondary | ICD-10-CM

## 2016-05-19 DIAGNOSIS — Z3689 Encounter for other specified antenatal screening: Secondary | ICD-10-CM | POA: Diagnosis not present

## 2016-05-19 DIAGNOSIS — O0990 Supervision of high risk pregnancy, unspecified, unspecified trimester: Secondary | ICD-10-CM

## 2016-05-19 NOTE — Progress Notes (Signed)
   PRENATAL VISIT NOTE  Subjective:  Jordan Hall is a 31 y.o. [phone] at 1571w1d being seen today for ongoing prenatal care.  She is currently monitored for the following issues for this high-risk pregnancy and has H/O macrosomia in infant in prior pregnancy, currently pregnant; Tobacco smoking affecting pregnancy; History of term stillbirth; Supervision of high risk pregnancy, antepartum; Asthma affecting pregnancy, antepartum; Alcohol abuse affecting pregnancy; and Unwanted fertility on her problem list.  Patient reports contractions since last night.  Contractions: Irregular. Vag. Bleeding: None.  Movement: Present. Denies leaking of fluid.   The following portions of the patient's history were reviewed and updated as appropriate: allergies, current medications, past family history, past medical history, past social history, past surgical history and problem list. Problem list updated.  Objective:   Vitals:   05/19/16 0852  BP: 123/64  Pulse: 90  Weight: 196 lb 11.2 oz (89.2 kg)    Fetal Status: Fetal Heart Rate (bpm): NST Fundal Height: 33 cm Movement: Present  Presentation: Vertex  General:  Alert, oriented and cooperative. Patient is in no acute distress.  Skin: Skin is warm and dry. No rash noted.   Cardiovascular: Normal heart rate noted  Respiratory: Normal respiratory effort, no problems with respiration noted  Abdomen: Soft, gravid, appropriate for gestational age. Pain/Pressure: Present     Pelvic:  Cervical exam performed Dilation: 1 Effacement (%): Thick Station: Ballotable  Extremities: Normal range of motion.  Edema: None  Mental Status: Normal mood and affect. Normal behavior. Normal judgment and thought content.   Assessment and Plan:  Pregnancy: [id number] at 3171w1d  1. Gestational diabetes mellitus (GDM), antepartum, gestational diabetes method of control unspecified - Twice weekly testing - NST- reactive, contractions q 10 minutes   2. History of term  stillbirth - Fetal nonstress test - [place] [facility] Limited  3. Supervision of high risk pregnancy, antepartum  4. Preterm contractions, third trimester  - Patient had intercourse yesterday, FFN not collected - Cervix: 1 cm/Thick/bal - very posterior - Advised pelvic rest, rest, hydration and continue to monitor contraction pattern - Patient to present to MAU for re-evaluation of preterm labor if contractions remain consistent throughout the next few hours or increase in frequency/severity  Preterm labor symptoms and general obstetric precautions including but not limited to vaginal bleeding, contractions, leaking of fluid and fetal movement were reviewed in detail with the patient. Please refer to After Visit Summary for other counseling recommendations.  Return in about 2 days (around 05/21/2016) for 2x/wk as scheduled.   Marny LowensteinJulie N Elide Stalzer, PA-C

## 2016-05-19 NOTE — Progress Notes (Addendum)
Pt informed that the ultrasound is considered a limited OB ultrasound and is not intended to be a complete ultrasound exam.  Patient also informed that the ultrasound is not being completed with the intent of assessing for fetal or placental anomalies or any pelvic abnormalities.  Explained that the purpose of today's ultrasound is to assess for presentation and amniotic fluid volume.  Patient acknowledges the purpose of the exam and the limitations of the study.    US for growth on 2/12.

## 2016-05-19 NOTE — Patient Instructions (Signed)
Introduction Patient Name: ________________________________________________ Patient Due Date: ____________________ What is a fetal movement count? A fetal movement count is the number of times that you feel your baby move during a certain amount of time. This may also be called a fetal kick count. A fetal movement count is recommended for every pregnant woman. You may be asked to start counting fetal movements as early as week 28 of your pregnancy. Pay attention to when your baby is most active. You may notice your baby's sleep and wake cycles. You may also notice things that make your baby move more. You should do a fetal movement count:  When your baby is normally most active.  At the same time each day. A good time to count movements is while you are resting, after having something to eat and drink. How do I count fetal movements? 1. Find a quiet, comfortable area. Sit, or lie down on your side. 2. Write down the date, the start time and stop time, and the number of movements that you felt between those two times. Take this information with you to your health care visits. 3. For 2 hours, count kicks, flutters, swishes, rolls, and jabs. You should feel at least 10 movements during 2 hours. 4. You may stop counting after you have felt 10 movements. 5. If you do not feel 10 movements in 2 hours, have something to eat and drink. Then, keep resting and counting for 1 hour. If you feel at least 4 movements during that hour, you may stop counting. Contact a health care provider if:  You feel fewer than 4 movements in 2 hours.  Your baby is not moving like he or she usually does. Date: ____________ Start time: ____________ Stop time: ____________ Movements: ____________ Date: ____________ Start time: ____________ Stop time: ____________ Movements: ____________ Date: ____________ Start time: ____________ Stop time: ____________ Movements: ____________ Date: ____________ Start time: ____________  Stop time: ____________ Movements: ____________ Date: ____________ Start time: ____________ Stop time: ____________ Movements: ____________ Date: ____________ Start time: ____________ Stop time: ____________ Movements: ____________ Date: ____________ Start time: ____________ Stop time: ____________ Movements: ____________ Date: ____________ Start time: ____________ Stop time: ____________ Movements: ____________ Date: ____________ Start time: ____________ Stop time: ____________ Movements: ____________ This information is not intended to replace advice given to you by your health care provider. Make sure you discuss any questions you have with your health care provider. Document Released: 04/29/2006 Document Revised: 11/27/2015 Document Reviewed: 05/09/2015 Elsevier Interactive Patient Education  2017 [employer] Braxton Hicks Contractions Contractions of the uterus can occur throughout pregnancy. Contractions are not always a sign that you are in labor.  WHAT ARE BRAXTON HICKS CONTRACTIONS?  Contractions that occur before labor are called Braxton Hicks contractions, or false labor. Toward the end of pregnancy (32-34 weeks), these contractions can develop more often and may become more forceful. This is not true labor because these contractions do not result in opening (dilatation) and thinning of the cervix. They are sometimes difficult to tell apart from true labor because these contractions can be forceful and people have different pain tolerances. You should not feel embarrassed if you go to the hospital with false labor. Sometimes, the only way to tell if you are in true labor is for your health care provider to look for changes in the cervix. If there are no prenatal problems or other health problems associated with the pregnancy, it is completely safe to be sent home with false labor and await the onset of true labor.   HOW CAN YOU TELL THE DIFFERENCE BETWEEN TRUE AND FALSE LABOR? False Labor     The contractions of false labor are usually shorter and not as hard as those of true labor.   The contractions are usually irregular.   The contractions are often felt in the front of the lower abdomen and in the groin.   The contractions may go away when you walk around or change positions while lying down.   The contractions get weaker and are shorter lasting as time goes on.   The contractions do not usually become progressively stronger, regular, and closer together as with true labor.  True Labor   Contractions in true labor last 30-70 seconds, become very regular, usually become more intense, and increase in frequency.   The contractions do not go away with walking.   The discomfort is usually felt in the top of the uterus and spreads to the lower abdomen and low back.   True labor can be determined by your health care provider with an exam. This will show that the cervix is dilating and getting thinner.  WHAT TO REMEMBER  Keep up with your usual exercises and follow other instructions given by your health care provider.   Take medicines as directed by your health care provider.   Keep your regular prenatal appointments.   Eat and drink lightly if you think you are going into labor.   If Braxton Hicks contractions are making you uncomfortable:   Change your position from lying down or resting to walking, or from walking to resting.   Sit and rest in a tub of warm water.   Drink 2-3 glasses of water. Dehydration may cause these contractions.   Do slow and deep breathing several times an hour.  WHEN SHOULD I SEEK IMMEDIATE MEDICAL CARE? Seek immediate medical care if:  Your contractions become stronger, more regular, and closer together.   You have fluid leaking or gushing from your vagina.   You have a fever.   You pass blood-tinged mucus.   You have vaginal bleeding.   You have continuous abdominal pain.   You have low back pain  that you never had before.   You feel your baby's head pushing down and causing pelvic pressure.   Your baby is not moving as much as it used to.  This information is not intended to replace advice given to you by your health care provider. Make sure you discuss any questions you have with your health care provider. Document Released: 03/30/2005 Document Revised: 07/22/2015 Document Reviewed: 01/09/2013 Elsevier Interactive Patient Education  2017 [employer]  

## 2016-05-21 ENCOUNTER — Ambulatory Visit: Payer: Medicaid Other | Admitting: *Deleted

## 2016-05-21 VITALS — BP 125/65 | HR 91

## 2016-05-21 DIAGNOSIS — Z8759 Personal history of other complications of pregnancy, childbirth and the puerperium: Secondary | ICD-10-CM

## 2016-05-21 NOTE — Progress Notes (Signed)
NST Note Date: 05/21/2016 Gestational Age: 29/3 FHT: 135 baseline, positive accelerations, negative, deceleration, moderate variability Toco: rare UCs Time: 50 minutes  A/P: rNST. Continue with current plan of care  Cornelia Copaharlie Tvisha Schwoerer, Jr MD Attending Center [facility](Faculty Practice)

## 2016-05-25 ENCOUNTER — Encounter (HOSPITAL_COMMUNITY): Payer: Self-pay

## 2016-05-25 ENCOUNTER — Encounter: Payer: Self-pay | Admitting: Obstetrics & Gynecology

## 2016-05-25 ENCOUNTER — Ambulatory Visit (INDEPENDENT_AMBULATORY_CARE_PROVIDER_SITE_OTHER): Payer: Medicaid Other | Admitting: Obstetrics & Gynecology

## 2016-05-25 ENCOUNTER — Ambulatory Visit (HOSPITAL_COMMUNITY)
Admission: RE | Admit: 2016-05-25 | Discharge: 2016-05-25 | Disposition: A | Discharge status: Home / Self Care | Payer: Medicaid Other | Source: Ambulatory Visit | Attending: Family Medicine | Admitting: Family Medicine

## 2016-05-25 VITALS — BP 115/62 | HR 115 | Wt 194.7 lb

## 2016-05-25 DIAGNOSIS — O09293 Supervision of pregnancy with other poor reproductive or obstetric history, third trimester: Secondary | ICD-10-CM | POA: Insufficient documentation

## 2016-05-25 DIAGNOSIS — F1010 Alcohol abuse, uncomplicated: Secondary | ICD-10-CM

## 2016-05-25 DIAGNOSIS — Z3A34 34 weeks gestation of pregnancy: Secondary | ICD-10-CM | POA: Diagnosis not present

## 2016-05-25 DIAGNOSIS — O350XX0 Maternal care for (suspected) central nervous system malformation in fetus, not applicable or unspecified: Secondary | ICD-10-CM | POA: Diagnosis not present

## 2016-05-25 DIAGNOSIS — O09292 Supervision of pregnancy with other poor reproductive or obstetric history, second trimester: Secondary | ICD-10-CM

## 2016-05-25 DIAGNOSIS — F1099 Alcohol use, unspecified with unspecified alcohol-induced disorder: Secondary | ICD-10-CM | POA: Diagnosis not present

## 2016-05-25 DIAGNOSIS — Z8759 Personal history of other complications of pregnancy, childbirth and the puerperium: Secondary | ICD-10-CM

## 2016-05-25 DIAGNOSIS — O99333 Smoking (tobacco) complicating pregnancy, third trimester: Secondary | ICD-10-CM | POA: Insufficient documentation

## 2016-05-25 DIAGNOSIS — O99313 Alcohol use complicating pregnancy, third trimester: Secondary | ICD-10-CM | POA: Diagnosis not present

## 2016-05-25 DIAGNOSIS — O99310 Alcohol use complicating pregnancy, unspecified trimester: Secondary | ICD-10-CM

## 2016-05-25 DIAGNOSIS — O0990 Supervision of high risk pregnancy, unspecified, unspecified trimester: Secondary | ICD-10-CM

## 2016-05-25 NOTE — Patient Instructions (Signed)
Third Trimester of Pregnancy The third trimester is from week 29 through week 40 (months 7 through 9). The third trimester is a time when the unborn baby (fetus) is growing rapidly. At the end of the ninth month, the fetus is about 20 inches in length and weighs 6-10 pounds. Body changes during your third trimester Your body goes through many changes during pregnancy. The changes vary from woman to woman. During the third trimester:  Your weight will continue to increase. You can expect to gain 25-35 pounds (11-16 kg) by the end of the pregnancy.  You may begin to get stretch marks on your hips, abdomen, and breasts.  You may urinate more often because the fetus is moving lower into your pelvis and pressing on your bladder.  You may develop or continue to have heartburn. This is caused by increased hormones that slow down muscles in the digestive tract.  You may develop or continue to have constipation because increased hormones slow digestion and cause the muscles that push waste through your intestines to relax.  You may develop hemorrhoids. These are swollen veins (varicose veins) in the rectum that can itch or be painful.  You may develop swollen, bulging veins (varicose veins) in your legs.  You may have increased body aches in the pelvis, back, or thighs. This is due to weight gain and increased hormones that are relaxing your joints.  You may have changes in your hair. These can include thickening of your hair, rapid growth, and changes in texture. Some women also have hair loss during or after pregnancy, or hair that feels dry or thin. Your hair will most likely return to normal after your baby is born.  Your breasts will continue to grow and they will continue to become tender. A yellow fluid (colostrum) may leak from your breasts. This is the first milk you are producing for your baby.  Your belly button may stick out.  You may notice more swelling in your hands, face, or  ankles.  You may have increased tingling or numbness in your hands, arms, and legs. The skin on your belly may also feel numb.  You may feel short of breath because of your expanding uterus.  You may have more problems sleeping. This can be caused by the size of your belly, increased need to urinate, and an increase in your body's metabolism.  You may notice the fetus "dropping," or moving lower in your abdomen.  You may have increased vaginal discharge.  Your cervix becomes thin and soft (effaced) near your due date. What to expect at prenatal visits You will have prenatal exams every 2 weeks until week 36. Then you will have weekly prenatal exams. During a routine prenatal visit:  You will be weighed to make sure you and the fetus are growing normally.  Your blood pressure will be taken.  Your abdomen will be measured to track your baby's growth.  The fetal heartbeat will be listened to.  Any test results from the previous visit will be discussed.  You may have a cervical check near your due date to see if you have effaced. At around 36 weeks, your health care provider will check your cervix. At the same time, your health care provider will also perform a test on the secretions of the vaginal tissue. This test is to determine if a type of bacteria, Group B streptococcus, is present. Your health care provider will explain this further. Your health care provider may ask you:    What your birth plan is.  How you are feeling.  If you are feeling the baby move.  If you have had any abnormal symptoms, such as leaking fluid, bleeding, severe headaches, or abdominal cramping.  If you are using any tobacco products, including cigarettes, chewing tobacco, and electronic cigarettes.  If you have any questions. Other tests or screenings that may be performed during your third trimester include:  Blood tests that check for low iron levels (anemia).  Fetal testing to check the health,  activity level, and growth of the fetus. Testing is done if you have certain medical conditions or if there are problems during the pregnancy.  Nonstress test (NST). This test checks the health of your baby to make sure there are no signs of problems, such as the baby not getting enough oxygen. During this test, a belt is placed around your belly. The baby is made to move, and its heart rate is monitored during movement. What is false labor? False labor is a condition in which you feel small, irregular tightenings of the muscles in the womb (contractions) that eventually go away. These are called Braxton Hicks contractions. Contractions may last for hours, days, or even weeks before true labor sets in. If contractions come at regular intervals, become more frequent, increase in intensity, or become painful, you should see your health care provider. What are the signs of labor?  Abdominal cramps.  Regular contractions that start at 10 minutes apart and become stronger and more frequent with time.  Contractions that start on the top of the uterus and spread down to the lower abdomen and back.  Increased pelvic pressure and dull back pain.  A watery or bloody mucus discharge that comes from the vagina.  Leaking of amniotic fluid. This is also known as your "water breaking." It could be a slow trickle or a gush. Let your doctor know if it has a color or strange odor. If you have any of these signs, call your health care provider right away, even if it is before your due date. Follow these instructions at home: Eating and drinking  Continue to eat regular, healthy meals.  Do not eat:  Raw meat or meat spreads.  Unpasteurized milk or cheese.  Unpasteurized juice.  Store-made salad.  Refrigerated smoked seafood.  Hot dogs or deli meat, unless they are piping hot.  More than 6 ounces of albacore tuna a week.  Shark, swordfish, king mackerel, or tile fish.  Store-made salads.  Raw  sprouts, such as mung bean or alfalfa sprouts.  Take prenatal vitamins as told by your health care provider.  Take 1000 mg of calcium daily as told by your health care provider.  If you develop constipation:  Take over-the-counter or prescription medicines.  Drink enough fluid to keep your urine clear or pale yellow.  Eat foods that are high in fiber, such as fresh fruits and vegetables, whole grains, and beans.  Limit foods that are high in fat and processed sugars, such as fried and sweet foods. Activity  Exercise only as directed by your health care provider. Healthy pregnant women should aim for 2 hours and 30 minutes of moderate exercise per week. If you experience any pain or discomfort while exercising, stop.  Avoid heavy lifting.  Do not exercise in extreme heat or humidity, or at high altitudes.  Wear low-heel, comfortable shoes.  Practice good posture.  Do not travel far distances unless it is absolutely necessary and only with the approval   of your health care provider.  Wear your seat belt at all times while in a car, on a bus, or on a plane.  Take frequent breaks and rest with your legs elevated if you have leg cramps or low back pain.  Do not use hot tubs, steam rooms, or saunas.  You may continue to have sex unless your health care provider tells you otherwise. Lifestyle  Do not use any products that contain nicotine or tobacco, such as cigarettes and e-cigarettes. If you need help quitting, ask your health care provider.  Do not drink alcohol.  Do not use any medicinal herbs or unprescribed drugs. These chemicals affect the formation and growth of the baby.  If you develop varicose veins:  Wear support pantyhose or compression stockings as told by your healthcare provider.  Elevate your feet for 15 minutes, 3-4 times a day.  Wear a supportive maternity bra to help with breast tenderness. General instructions  Take over-the-counter and prescription  medicines only as told by your health care provider. There are medicines that are either safe or unsafe to take during pregnancy.  Take warm sitz baths to soothe any pain or discomfort caused by hemorrhoids. Use hemorrhoid cream or witch hazel if your health care provider approves.  Avoid cat litter boxes and soil used by cats. These carry germs that can cause birth defects in the baby. If you have a cat, ask someone to clean the litter box for you.  To prepare for the arrival of your baby:  Take prenatal classes to understand, practice, and ask questions about the labor and delivery.  Make a trial run to the hospital.  Visit the hospital and tour the maternity area.  Arrange for maternity or paternity leave through employers.  Arrange for family and friends to take care of pets while you are in the hospital.  Purchase a rear-facing car seat and make sure you know how to install it in your car.  Pack your hospital bag.  Prepare the baby's nursery. Make sure to remove all pillows and stuffed animals from the baby's crib to prevent suffocation.  Visit your dentist if you have not gone during your pregnancy. Use a soft toothbrush to brush your teeth and be gentle when you floss.  Keep all prenatal follow-up visits as told by your health care provider. This is important. Contact a health care provider if:  You are unsure if you are in labor or if your water has broken.  You become dizzy.  You have mild pelvic cramps, pelvic pressure, or nagging pain in your abdominal area.  You have lower back pain.  You have persistent nausea, vomiting, or diarrhea.  You have an unusual or bad smelling vaginal discharge.  You have pain when you urinate. Get help right away if:  You have a fever.  You are leaking fluid from your vagina.  You have spotting or bleeding from your vagina.  You have severe abdominal pain or cramping.  You have rapid weight loss or weight gain.  You have  shortness of breath with chest pain.  You notice sudden or extreme swelling of your face, hands, ankles, feet, or legs.  Your baby makes fewer than 10 movements in 2 hours.  You have severe headaches that do not go away with medicine.  You have vision changes. Summary  The third trimester is from week 29 through week 40, months 7 through 9. The third trimester is a time when the unborn baby (fetus)   is growing rapidly.  During the third trimester, your discomfort may increase as you and your baby continue to gain weight. You may have abdominal, leg, and back pain, sleeping problems, and an increased need to urinate.  During the third trimester your breasts will keep growing and they will continue to become tender. A yellow fluid (colostrum) may leak from your breasts. This is the first milk you are producing for your baby.  False labor is a condition in which you feel small, irregular tightenings of the muscles in the womb (contractions) that eventually go away. These are called Braxton Hicks contractions. Contractions may last for hours, days, or even weeks before true labor sets in.  Signs of labor can include: abdominal cramps; regular contractions that start at 10 minutes apart and become stronger and more frequent with time; watery or bloody mucus discharge that comes from the vagina; increased pelvic pressure and dull back pain; and leaking of amniotic fluid. This information is not intended to replace advice given to you by your health care provider. Make sure you discuss any questions you have with your health care provider. Document Released: 03/24/2001 Document Revised: 09/05/2015 Document Reviewed: 05/31/2012 Elsevier Interactive Patient Education  2017 [employer]  

## 2016-05-25 NOTE — Progress Notes (Signed)
   PRENATAL VISIT NOTE  Subjective:  Jordan Hall is a 31 y.o. [id number] at 6782w0d being seen today for ongoing prenatal care.  She is currently monitored for the following issues for this high-risk pregnancy and has H/O macrosomia in infant in prior pregnancy, currently pregnant; Tobacco smoking affecting pregnancy; History of term stillbirth; Supervision of high risk pregnancy, antepartum; Asthma affecting pregnancy, antepartum; Alcohol abuse affecting pregnancy; and Unwanted fertility on her problem list.  Patient reports heartburn.  Contractions: Irregular. Vag. Bleeding: None.  Movement: Present. Denies leaking of fluid.   The following portions of the patient's history were reviewed and updated as appropriate: allergies, current medications, past family history, past medical history, past social history, past surgical history and problem list. Problem list updated.  Objective:   Vitals:   05/25/16 0812  BP: 115/62  Pulse: (!) 115  Weight: 194 lb 11.2 oz (88.3 kg)    Fetal Status: Fetal Heart Rate (bpm): NST Fundal Height: 35 cm Movement: Present     General:  Alert, oriented and cooperative. Patient is in no acute distress.  Skin: Skin is warm and dry. No rash noted.   Cardiovascular: Normal heart rate noted  Respiratory: Normal respiratory effort, no problems with respiration noted  Abdomen: Soft, gravid, appropriate for gestational age. Pain/Pressure: Present     Pelvic:  Cervical exam deferred        Extremities: Normal range of motion.     Mental Status: Normal mood and affect. Normal behavior. Normal judgment and thought content.   Assessment and Plan:  Pregnancy: [id number] at 5682w0d  1. History of term stillbirth Reactive NST today - Fetal nonstress test  2. Supervision of high risk pregnancy, antepartum   Preterm labor symptoms and general obstetric precautions including but not limited to vaginal bleeding, contractions, leaking of fluid and fetal movement were  reviewed in detail with the patient. Please refer to After Visit Summary for other counseling recommendations.  Return in about 3 days (around 05/28/2016) for 2x/wk as scheduled.   Adam PhenixJames G Arnold, MD

## 2016-05-25 NOTE — Progress Notes (Signed)
US for growth today 

## 2016-05-28 ENCOUNTER — Other Ambulatory Visit: Payer: Self-pay

## 2016-06-01 ENCOUNTER — Other Ambulatory Visit: Payer: Self-pay | Admitting: Obstetrics and Gynecology

## 2016-06-03 NOTE — Progress Notes (Signed)
Reactive NST 

## 2016-06-04 ENCOUNTER — Ambulatory Visit (INDEPENDENT_AMBULATORY_CARE_PROVIDER_SITE_OTHER): Payer: Medicaid Other | Admitting: *Deleted

## 2016-06-04 VITALS — BP 126/69 | HR 105

## 2016-06-04 DIAGNOSIS — Z8759 Personal history of other complications of pregnancy, childbirth and the puerperium: Secondary | ICD-10-CM

## 2016-06-04 DIAGNOSIS — O09293 Supervision of pregnancy with other poor reproductive or obstetric history, third trimester: Secondary | ICD-10-CM

## 2016-06-05 NOTE — Progress Notes (Signed)
Reactive NST 

## 2016-06-08 ENCOUNTER — Ambulatory Visit: Payer: Self-pay

## 2016-06-08 ENCOUNTER — Other Ambulatory Visit (HOSPITAL_COMMUNITY)
Admission: RE | Admit: 2016-06-08 | Discharge: 2016-06-08 | Disposition: A | Discharge status: Home / Self Care | Payer: Medicaid Other | Source: Ambulatory Visit | Attending: Obstetrics and Gynecology | Admitting: Obstetrics and Gynecology

## 2016-06-08 ENCOUNTER — Ambulatory Visit (INDEPENDENT_AMBULATORY_CARE_PROVIDER_SITE_OTHER): Payer: Medicaid Other | Admitting: Obstetrics & Gynecology

## 2016-06-08 VITALS — BP 118/67 | HR 93 | Wt 198.8 lb

## 2016-06-08 DIAGNOSIS — Z3A36 36 weeks gestation of pregnancy: Secondary | ICD-10-CM | POA: Diagnosis not present

## 2016-06-08 DIAGNOSIS — O09293 Supervision of pregnancy with other poor reproductive or obstetric history, third trimester: Secondary | ICD-10-CM

## 2016-06-08 DIAGNOSIS — Z8759 Personal history of other complications of pregnancy, childbirth and the puerperium: Secondary | ICD-10-CM

## 2016-06-08 DIAGNOSIS — O0990 Supervision of high risk pregnancy, unspecified, unspecified trimester: Secondary | ICD-10-CM

## 2016-06-08 DIAGNOSIS — O0993 Supervision of high risk pregnancy, unspecified, third trimester: Secondary | ICD-10-CM | POA: Diagnosis not present

## 2016-06-08 DIAGNOSIS — Z3689 Encounter for other specified antenatal screening: Secondary | ICD-10-CM | POA: Diagnosis not present

## 2016-06-08 NOTE — Progress Notes (Addendum)
Pt informed that the ultrasound is considered a limited OB ultrasound and is not intended to be a complete ultrasound exam.  Patient also informed that the ultrasound is not being completed with the intent of assessing for fetal or placental anomalies or any pelvic abnormalities.  Explained that the purpose of today's ultrasound is to assess for presentation and amniotic fluid volume.  Patient acknowledges the purpose of the exam and the limitations of the study.   Pt reports having clumpy white vaginal d/c - denies irritation or itching.

## 2016-06-08 NOTE — Progress Notes (Signed)
   PRENATAL VISIT NOTE  Subjective:  Jordan Hall is a 31 y.o. [phone] at 931w0d being seen today for ongoing prenatal care.  She is currently monitored for the following issues for this high-risk pregnancy and has H/O macrosomia in infant in prior pregnancy, currently pregnant; Tobacco smoking affecting pregnancy; History of term stillbirth; Supervision of high risk pregnancy, antepartum; Asthma affecting pregnancy, antepartum; Alcohol abuse affecting pregnancy; and Unwanted fertility on her problem list.  Patient reports no complaints.  Contractions: Irregular. Vag. Bleeding: None.  Movement: Present. Denies leaking of fluid.   The following portions of the patient's history were reviewed and updated as appropriate: allergies, current medications, past family history, past medical history, past social history, past surgical history and problem list. Problem list updated.  Objective:   Vitals:   06/08/16 0754  BP: 118/67  Pulse: 93  Weight: 198 lb 12.8 oz (90.2 kg)    Fetal Status: Fetal Heart Rate (bpm): NST   Movement: Present  Presentation: Vertex  General:  Alert, oriented and cooperative. Patient is in no acute distress.  Skin: Skin is warm and dry. No rash noted.   Cardiovascular: Normal heart rate noted  Respiratory: Normal respiratory effort, no problems with respiration noted  Abdomen: Soft, gravid, appropriate for gestational age. Pain/Pressure: Present     Pelvic:  Cervical exam performed        Extremities: Normal range of motion.  Edema: None  Mental Status: Normal mood and affect. Normal behavior. Normal judgment and thought content.   Assessment and Plan:  Pregnancy: [id number] at 7631w0d  1. History of term stillbirth  - Fetal nonstress test - [place] [facility] Limited  2. Supervision of high risk pregnancy, antepartum - Culture, beta strep (group b only) - Cervicovaginal ancillary only  Preterm labor symptoms and general obstetric precautions including but not limited to  vaginal bleeding, contractions, leaking of fluid and fetal movement were reviewed in detail with the patient. Please refer to After Visit Summary for other counseling recommendations.  Return in about 3 days (around 06/11/2016) for 2x/wk as scheduled.   Allie BossierMyra C Mackenzy Eisenberg, MD

## 2016-06-09 LAB — CERVICOVAGINAL ANCILLARY ONLY
Bacterial vaginitis: NEGATIVE
Candida vaginitis: NEGATIVE
Chlamydia: NEGATIVE
Neisseria Gonorrhea: NEGATIVE
Trichomonas: NEGATIVE

## 2016-06-10 LAB — CULTURE, BETA STREP (GROUP B ONLY)

## 2016-06-11 ENCOUNTER — Ambulatory Visit (INDEPENDENT_AMBULATORY_CARE_PROVIDER_SITE_OTHER): Payer: Medicaid Other | Admitting: Obstetrics & Gynecology

## 2016-06-11 DIAGNOSIS — O09293 Supervision of pregnancy with other poor reproductive or obstetric history, third trimester: Secondary | ICD-10-CM

## 2016-06-11 DIAGNOSIS — O0990 Supervision of high risk pregnancy, unspecified, unspecified trimester: Secondary | ICD-10-CM

## 2016-06-11 DIAGNOSIS — Z8759 Personal history of other complications of pregnancy, childbirth and the puerperium: Secondary | ICD-10-CM

## 2016-06-11 LAB — OB RESULTS CONSOLE GBS: STREP GROUP B AG: NEGATIVE

## 2016-06-11 NOTE — Progress Notes (Signed)
NST performed today was reviewed and was found to be reactive.  Continue recommended antenatal testing and prenatal care.  

## 2016-06-11 NOTE — Progress Notes (Signed)
GBS re-collected.  

## 2016-06-15 ENCOUNTER — Ambulatory Visit: Payer: Self-pay

## 2016-06-15 ENCOUNTER — Ambulatory Visit (INDEPENDENT_AMBULATORY_CARE_PROVIDER_SITE_OTHER): Payer: Medicaid Other | Admitting: Obstetrics & Gynecology

## 2016-06-15 VITALS — BP 133/70 | HR 92 | Wt 203.8 lb

## 2016-06-15 DIAGNOSIS — Z3689 Encounter for other specified antenatal screening: Secondary | ICD-10-CM

## 2016-06-15 DIAGNOSIS — O09293 Supervision of pregnancy with other poor reproductive or obstetric history, third trimester: Secondary | ICD-10-CM

## 2016-06-15 DIAGNOSIS — Z8759 Personal history of other complications of pregnancy, childbirth and the puerperium: Secondary | ICD-10-CM

## 2016-06-15 DIAGNOSIS — O0990 Supervision of high risk pregnancy, unspecified, unspecified trimester: Secondary | ICD-10-CM

## 2016-06-15 LAB — CULTURE, BETA STREP (GROUP B ONLY): Strep Gp B Culture: NEGATIVE

## 2016-06-15 NOTE — Progress Notes (Signed)
Pt reports decreased FM - felt good FM during NST.  IOL scheduled 3/19 @ 0700.

## 2016-06-15 NOTE — Progress Notes (Signed)
   PRENATAL VISIT NOTE  Subjective:  Jordan Hall is a 31 y.o. [phone] at 5345w0d being seen today for ongoing prenatal care.  She is currently monitored for the following issues for this high-risk pregnancy and has H/O macrosomia in infant in prior pregnancy, currently pregnant; Tobacco smoking affecting pregnancy; History of term stillbirth; Supervision of high risk pregnancy, antepartum; Asthma affecting pregnancy, antepartum; Alcohol abuse affecting pregnancy; and Unwanted fertility on her problem list.  Patient reports no complaints.  Contractions: Irregular. Vag. Bleeding: None.  Movement: (!) Decreased. Denies leaking of fluid.   The following portions of the patient's history were reviewed and updated as appropriate: allergies, current medications, past family history, past medical history, past social history, past surgical history and problem list. Problem list updated.  Objective:   Vitals:   06/15/16 0804  BP: 133/70  Pulse: 92  Weight: 203 lb 12.8 oz (92.4 kg)    Fetal Status: Fetal Heart Rate (bpm): NST   Movement: (!) Decreased  Presentation: Vertex  General:  Alert, oriented and cooperative. Patient is in no acute distress.  Skin: Skin is warm and dry. No rash noted.   Cardiovascular: Normal heart rate noted  Respiratory: Normal respiratory effort, no problems with respiration noted  Abdomen: Soft, gravid, appropriate for gestational age. Pain/Pressure: Present     Pelvic:  Cervical exam deferred        Extremities: Normal range of motion.  Edema: Mild pitting, slight indentation  Mental Status: Normal mood and affect. Normal behavior. Normal judgment and thought content.   Assessment and Plan:  Pregnancy: [id number] at 7345w0d  1. History of term stillbirth NST reactive today - Fetal nonstress test - [place] OB Limited; Future  2. Supervision of high risk pregnancy, antepartum   Term labor symptoms and general obstetric precautions including but not limited to vaginal  bleeding, contractions, leaking of fluid and fetal movement were reviewed in detail with the patient. Please refer to After Visit Summary for other counseling recommendations.  Return in about 3 days (around 06/18/2016) for 2x/wk as scheduled.   Adam PhenixJames G Milliana Reddoch, MD

## 2016-06-17 ENCOUNTER — Inpatient Hospital Stay (HOSPITAL_COMMUNITY)
Admission: AD | Admit: 2016-06-17 | Discharge: 2016-06-20 | DRG: 774 | Disposition: A | Discharge status: Home / Self Care | Payer: Medicaid Other | Source: Ambulatory Visit | Attending: Obstetrics and Gynecology | Admitting: Obstetrics and Gynecology

## 2016-06-17 ENCOUNTER — Encounter (HOSPITAL_COMMUNITY): Payer: Self-pay

## 2016-06-17 DIAGNOSIS — Z87891 Personal history of nicotine dependence: Secondary | ICD-10-CM

## 2016-06-17 DIAGNOSIS — O9952 Diseases of the respiratory system complicating childbirth: Secondary | ICD-10-CM | POA: Diagnosis present

## 2016-06-17 DIAGNOSIS — O4693 Antepartum hemorrhage, unspecified, third trimester: Secondary | ICD-10-CM

## 2016-06-17 DIAGNOSIS — O99214 Obesity complicating childbirth: Secondary | ICD-10-CM | POA: Diagnosis present

## 2016-06-17 DIAGNOSIS — Z8249 Family history of ischemic heart disease and other diseases of the circulatory system: Secondary | ICD-10-CM

## 2016-06-17 DIAGNOSIS — O4593 Premature separation of placenta, unspecified, third trimester: Secondary | ICD-10-CM | POA: Diagnosis present

## 2016-06-17 DIAGNOSIS — Z6835 Body mass index (BMI) 35.0-35.9, adult: Secondary | ICD-10-CM

## 2016-06-17 DIAGNOSIS — J45909 Unspecified asthma, uncomplicated: Secondary | ICD-10-CM | POA: Diagnosis present

## 2016-06-17 DIAGNOSIS — Z3A37 37 weeks gestation of pregnancy: Secondary | ICD-10-CM

## 2016-06-17 DIAGNOSIS — O0990 Supervision of high risk pregnancy, unspecified, unspecified trimester: Secondary | ICD-10-CM

## 2016-06-17 DIAGNOSIS — N939 Abnormal uterine and vaginal bleeding, unspecified: Secondary | ICD-10-CM

## 2016-06-17 NOTE — MAU Note (Signed)
PT  SAYS AT 1055PM  SHE   WAS SITTING  AT  TABLE   AND  FELT  A  GUSH-   OF  BLOOD.     THEN IN   TRIAGE  - SHE  THINKS   [facility].     [facility]  [facility]    2   CM.      DENIES  HSV AND   MRSA.      GBS-   UNSURE.

## 2016-06-18 ENCOUNTER — Observation Stay (HOSPITAL_COMMUNITY): Payer: Medicaid Other | Admitting: Anesthesiology

## 2016-06-18 ENCOUNTER — Other Ambulatory Visit: Payer: Self-pay

## 2016-06-18 ENCOUNTER — Encounter (HOSPITAL_COMMUNITY): Payer: Self-pay | Admitting: General Practice

## 2016-06-18 DIAGNOSIS — O4202 Full-term premature rupture of membranes, onset of labor within 24 hours of rupture: Secondary | ICD-10-CM

## 2016-06-18 DIAGNOSIS — O4292 Full-term premature rupture of membranes, unspecified as to length of time between rupture and onset of labor: Secondary | ICD-10-CM | POA: Diagnosis present

## 2016-06-18 DIAGNOSIS — Z87891 Personal history of nicotine dependence: Secondary | ICD-10-CM | POA: Diagnosis not present

## 2016-06-18 DIAGNOSIS — J45909 Unspecified asthma, uncomplicated: Secondary | ICD-10-CM | POA: Diagnosis present

## 2016-06-18 DIAGNOSIS — Z8249 Family history of ischemic heart disease and other diseases of the circulatory system: Secondary | ICD-10-CM | POA: Diagnosis not present

## 2016-06-18 DIAGNOSIS — O4593 Premature separation of placenta, unspecified, third trimester: Secondary | ICD-10-CM | POA: Diagnosis present

## 2016-06-18 DIAGNOSIS — O99214 Obesity complicating childbirth: Secondary | ICD-10-CM | POA: Diagnosis present

## 2016-06-18 DIAGNOSIS — Z3A37 37 weeks gestation of pregnancy: Secondary | ICD-10-CM | POA: Diagnosis not present

## 2016-06-18 DIAGNOSIS — Z6835 Body mass index (BMI) 35.0-35.9, adult: Secondary | ICD-10-CM | POA: Diagnosis not present

## 2016-06-18 DIAGNOSIS — O9952 Diseases of the respiratory system complicating childbirth: Secondary | ICD-10-CM | POA: Diagnosis present

## 2016-06-18 DIAGNOSIS — N939 Abnormal uterine and vaginal bleeding, unspecified: Secondary | ICD-10-CM

## 2016-06-18 DIAGNOSIS — O4693 Antepartum hemorrhage, unspecified, third trimester: Secondary | ICD-10-CM | POA: Diagnosis present

## 2016-06-18 LAB — ABO/RH: ABO/RH(D): O POS

## 2016-06-18 LAB — TYPE AND SCREEN
ABO/RH(D): O POS
Antibody Screen: NEGATIVE

## 2016-06-18 LAB — RAPID URINE DRUG SCREEN, HOSP PERFORMED
Amphetamines: NOT DETECTED
BARBITURATES: NOT DETECTED
Benzodiazepines: NOT DETECTED
Cocaine: NOT DETECTED
Opiates: NOT DETECTED
Tetrahydrocannabinol: NOT DETECTED

## 2016-06-18 LAB — CBC
HEMATOCRIT: 34.2 % — AB (ref 36.0–46.0)
HEMOGLOBIN: 11.5 g/dL — AB (ref 12.0–15.0)
MCH: 31.9 pg (ref 26.0–34.0)
MCHC: 33.6 g/dL (ref 30.0–36.0)
MCV: 94.7 fL (ref 78.0–100.0)
Platelets: 249 10*3/uL (ref 150–400)
RBC: 3.61 MIL/uL — ABNORMAL LOW (ref 3.87–5.11)
RDW: 13.9 % (ref 11.5–15.5)
WBC: 10.5 10*3/uL (ref 4.0–10.5)

## 2016-06-18 LAB — GLUCOSE, CAPILLARY
GLUCOSE-CAPILLARY: 96 mg/dL (ref 65–99)
GLUCOSE-CAPILLARY: 100 mg/dL — AB (ref 65–99)
GLUCOSE-CAPILLARY: 75 mg/dL (ref 65–99)
Glucose-Capillary: 140 mg/dL — ABNORMAL HIGH (ref 65–99)

## 2016-06-18 MED ORDER — PHENYLEPHRINE 40 MCG/ML (10ML) SYRINGE FOR IV PUSH (FOR BLOOD PRESSURE SUPPORT)
80.0000 ug | PREFILLED_SYRINGE | INTRAVENOUS | Status: DC | PRN
  Filled 2016-06-18: qty 10

## 2016-06-18 MED ORDER — MEASLES, MUMPS & RUBELLA VAC ~~LOC~~ INJ
0.5000 mL | INJECTION | Freq: Once | SUBCUTANEOUS | Status: DC
  Filled 2016-06-18: qty 0.5

## 2016-06-18 MED ORDER — FENTANYL CITRATE (PF) 100 MCG/2ML IJ SOLN
100.0000 ug | INTRAMUSCULAR | Status: DC | PRN
  Administered 2016-06-18: 100 ug via INTRAVENOUS
  Filled 2016-06-18: qty 2

## 2016-06-18 MED ORDER — EPHEDRINE 5 MG/ML INJ: 10.0000 mg | INTRAVENOUS | Status: DC | PRN

## 2016-06-18 MED ORDER — TERBUTALINE SULFATE 1 MG/ML IJ SOLN: 0.2500 mg | Freq: Once | INTRAMUSCULAR | Status: DC | PRN

## 2016-06-18 MED ORDER — DIBUCAINE 1 % RE OINT: 1.0000 "application " | TOPICAL_OINTMENT | RECTAL | Status: DC | PRN

## 2016-06-18 MED ORDER — LACTATED RINGERS IV SOLN
500.0000 mL | INTRAVENOUS | Status: DC | PRN
  Administered 2016-06-18: 500 mL via INTRAVENOUS

## 2016-06-18 MED ORDER — COCONUT OIL OIL: 1.0000 "application " | TOPICAL_OIL | Status: DC | PRN

## 2016-06-18 MED ORDER — PHENYLEPHRINE 40 MCG/ML (10ML) SYRINGE FOR IV PUSH (FOR BLOOD PRESSURE SUPPORT): 80.0000 ug | PREFILLED_SYRINGE | INTRAVENOUS | Status: DC | PRN

## 2016-06-18 MED ORDER — OXYCODONE HCL 5 MG PO TABS
10.0000 mg | ORAL_TABLET | ORAL | Status: DC | PRN
Start: 2016-06-18 — End: 2016-06-20

## 2016-06-18 MED ORDER — OXYCODONE-ACETAMINOPHEN 5-325 MG PO TABS: 1.0000 | ORAL_TABLET | ORAL | Status: DC | PRN

## 2016-06-18 MED ORDER — DOCUSATE SODIUM 100 MG PO CAPS
100.0000 mg | ORAL_CAPSULE | Freq: Two times a day (BID) | ORAL | Status: DC
  Administered 2016-06-19 – 2016-06-20 (×4): 100 mg via ORAL
  Filled 2016-06-18 (×4): qty 1

## 2016-06-18 MED ORDER — IBUPROFEN 600 MG PO TABS
600.0000 mg | ORAL_TABLET | Freq: Four times a day (QID) | ORAL | Status: DC
  Administered 2016-06-19 – 2016-06-20 (×6): 600 mg via ORAL
  Filled 2016-06-18 (×6): qty 1

## 2016-06-18 MED ORDER — ONDANSETRON HCL 4 MG PO TABS: 4.0000 mg | ORAL_TABLET | ORAL | Status: DC | PRN

## 2016-06-18 MED ORDER — METHYLERGONOVINE MALEATE 0.2 MG PO TABS: 0.2000 mg | ORAL_TABLET | ORAL | Status: DC | PRN

## 2016-06-18 MED ORDER — OXYTOCIN BOLUS FROM INFUSION
500.0000 mL | Freq: Once | INTRAVENOUS | Status: AC
  Administered 2016-06-18: 500 mL via INTRAVENOUS

## 2016-06-18 MED ORDER — ONDANSETRON HCL 4 MG/2ML IJ SOLN: 4.0000 mg | INTRAMUSCULAR | Status: DC | PRN

## 2016-06-18 MED ORDER — OXYTOCIN 40 UNITS IN LACTATED RINGERS INFUSION - SIMPLE MED
2.5000 [IU]/h | INTRAVENOUS | Status: DC
  Filled 2016-06-18 (×2): qty 1000

## 2016-06-18 MED ORDER — FERROUS SULFATE 325 (65 FE) MG PO TABS
325.0000 mg | ORAL_TABLET | Freq: Two times a day (BID) | ORAL | Status: DC
Start: 2016-06-19 — End: 2016-06-20
  Administered 2016-06-19 – 2016-06-20 (×3): 325 mg via ORAL
  Filled 2016-06-18 (×3): qty 1

## 2016-06-18 MED ORDER — ACETAMINOPHEN 325 MG PO TABS: 650.0000 mg | ORAL_TABLET | ORAL | Status: DC | PRN

## 2016-06-18 MED ORDER — LACTATED RINGERS IV SOLN: 500.0000 mL | Freq: Once | INTRAVENOUS | Status: DC

## 2016-06-18 MED ORDER — SOD CITRATE-CITRIC ACID 500-334 MG/5ML PO SOLN: 30.0000 mL | ORAL | Status: DC | PRN

## 2016-06-18 MED ORDER — ZOLPIDEM TARTRATE 5 MG PO TABS
5.0000 mg | ORAL_TABLET | Freq: Every evening | ORAL | Status: DC | PRN
Start: 2016-06-18 — End: 2016-06-20

## 2016-06-18 MED ORDER — EPHEDRINE 5 MG/ML INJ
10.0000 mg | INTRAVENOUS | Status: DC | PRN
Start: 2016-06-18 — End: 2016-06-18

## 2016-06-18 MED ORDER — LIDOCAINE HCL (PF) 1 % IJ SOLN
INTRAMUSCULAR | Status: DC | PRN
  Administered 2016-06-18 (×2): 6 mL via EPIDURAL

## 2016-06-18 MED ORDER — BISACODYL 10 MG RE SUPP: 10.0000 mg | Freq: Every day | RECTAL | Status: DC | PRN

## 2016-06-18 MED ORDER — FLEET ENEMA 7-19 GM/118ML RE ENEM: 1.0000 | ENEMA | Freq: Every day | RECTAL | Status: DC | PRN

## 2016-06-18 MED ORDER — DIPHENHYDRAMINE HCL 25 MG PO CAPS: 25.0000 mg | ORAL_CAPSULE | Freq: Four times a day (QID) | ORAL | Status: DC | PRN

## 2016-06-18 MED ORDER — DIPHENHYDRAMINE HCL 50 MG/ML IJ SOLN: 12.5000 mg | INTRAMUSCULAR | Status: DC | PRN

## 2016-06-18 MED ORDER — ALBUTEROL SULFATE (2.5 MG/3ML) 0.083% IN NEBU: 3.0000 mL | INHALATION_SOLUTION | RESPIRATORY_TRACT | Status: DC | PRN

## 2016-06-18 MED ORDER — OXYCODONE HCL 5 MG PO TABS
5.0000 mg | ORAL_TABLET | ORAL | Status: DC | PRN
  Administered 2016-06-19 (×2): 5 mg via ORAL
  Filled 2016-06-18 (×2): qty 1

## 2016-06-18 MED ORDER — SIMETHICONE 80 MG PO CHEW: 80.0000 mg | CHEWABLE_TABLET | ORAL | Status: DC | PRN

## 2016-06-18 MED ORDER — FLEET ENEMA 7-19 GM/118ML RE ENEM: 1.0000 | ENEMA | RECTAL | Status: DC | PRN

## 2016-06-18 MED ORDER — ZOLPIDEM TARTRATE 5 MG PO TABS
5.0000 mg | ORAL_TABLET | Freq: Once | ORAL | Status: AC
  Administered 2016-06-18: 5 mg via ORAL
  Filled 2016-06-18: qty 1

## 2016-06-18 MED ORDER — ACETAMINOPHEN 325 MG PO TABS
650.0000 mg | ORAL_TABLET | ORAL | Status: DC | PRN
  Administered 2016-06-19 – 2016-06-20 (×4): 650 mg via ORAL
  Filled 2016-06-18 (×4): qty 2

## 2016-06-18 MED ORDER — OXYTOCIN 40 UNITS IN LACTATED RINGERS INFUSION - SIMPLE MED
1.0000 m[IU]/min | INTRAVENOUS | Status: DC
  Administered 2016-06-18: 2 m[IU]/min via INTRAVENOUS

## 2016-06-18 MED ORDER — OXYCODONE-ACETAMINOPHEN 5-325 MG PO TABS: 2.0000 | ORAL_TABLET | ORAL | Status: DC | PRN

## 2016-06-18 MED ORDER — PRENATAL MULTIVITAMIN CH
1.0000 | ORAL_TABLET | Freq: Every day | ORAL | Status: DC
  Administered 2016-06-19 – 2016-06-20 (×2): 1 via ORAL
  Filled 2016-06-18 (×2): qty 1

## 2016-06-18 MED ORDER — WITCH HAZEL-GLYCERIN EX PADS: 1.0000 "application " | MEDICATED_PAD | CUTANEOUS | Status: DC | PRN

## 2016-06-18 MED ORDER — ONDANSETRON HCL 4 MG/2ML IJ SOLN: 4.0000 mg | Freq: Four times a day (QID) | INTRAMUSCULAR | Status: DC | PRN

## 2016-06-18 MED ORDER — LACTATED RINGERS IV SOLN
INTRAVENOUS | Status: DC
  Administered 2016-06-18 (×4): via INTRAVENOUS

## 2016-06-18 MED ORDER — PRENATAL PLUS 27-1 MG PO TABS
1.0000 | ORAL_TABLET | Freq: Every day | ORAL | Status: DC
  Administered 2016-06-18: 1 via ORAL
  Filled 2016-06-18: qty 1

## 2016-06-18 MED ORDER — MISOPROSTOL 50MCG HALF TABLET
50.0000 ug | ORAL_TABLET | ORAL | Status: DC
  Administered 2016-06-18: 50 ug via ORAL
  Filled 2016-06-18: qty 0.5

## 2016-06-18 MED ORDER — FENTANYL 2.5 MCG/ML BUPIVACAINE 1/10 % EPIDURAL INFUSION (WH - ANES)
14.0000 mL/h | INTRAMUSCULAR | Status: DC | PRN
  Administered 2016-06-18: 14 mL/h via EPIDURAL
  Filled 2016-06-18: qty 100

## 2016-06-18 MED ORDER — TETANUS-DIPHTH-ACELL PERTUSSIS 5-2.5-18.5 LF-MCG/0.5 IM SUSP: 0.5000 mL | Freq: Once | INTRAMUSCULAR | Status: DC

## 2016-06-18 MED ORDER — METHYLERGONOVINE MALEATE 0.2 MG/ML IJ SOLN: 0.2000 mg | INTRAMUSCULAR | Status: DC | PRN

## 2016-06-18 MED ORDER — LIDOCAINE HCL (PF) 1 % IJ SOLN
30.0000 mL | INTRAMUSCULAR | Status: DC | PRN
  Filled 2016-06-18: qty 30

## 2016-06-18 MED ORDER — BENZOCAINE-MENTHOL 20-0.5 % EX AERO: 1.0000 "application " | INHALATION_SPRAY | CUTANEOUS | Status: DC | PRN

## 2016-06-18 NOTE — Progress Notes (Signed)
Vertex confirmed by U/S

## 2016-06-18 NOTE — Progress Notes (Signed)
Pitocin started at 1412

## 2016-06-18 NOTE — Progress Notes (Signed)
Patient ID: Jordan Hall, female   DOB: 04-17-1985, 31 y.o.   MRN: [id number]  Pt rested during the night; states she felt ctx increase BP 111/54, P91, afeb FHR 135-145, +accels, no decels Ctx irreg q 3-8 mins Cx unchanged (2/thick/post/vtx-3) Mod amt bld on pad, blood on glove after exam  IUP@37 .3wks Third tri bldg Cx unfavorable  Will order cytotec for ripening, then progress prn to [facility]  Ahmon Tosi CNM 06/18/2016 7:19 AM

## 2016-06-18 NOTE — Progress Notes (Signed)
Jordan Hall, 30yo female, [phone], at 3127w3d, in labor Augmentation: Cytotec @0836  US @33w : EFW 2532 p76 S: Comfortable, feeling regular contractions  O: BP 135/69   Pulse 81   Temp 98.5 F (36.9 C) (Axillary)   Resp 16   Ht 5\' 3"  (1.6 m)   Wt 203 lb (92.1 kg)   LMP 09/02/2015   BMI 35.96 kg/m  SVE: 3/70/-3 FHR: 145, moderate variability, +acels, no decels Labs: CBC: Hbg 11.5 Htc 34.2% plaq 249k O pos GBS neg  A: progressing  P: continue care  Adair LaundryAna Carvalho do Amaral MD PGY1

## 2016-06-18 NOTE — H&P (Signed)
LABOR AND DELIVERY ADMISSION HISTORY AND PHYSICAL NOTE  Jordan Hall is a 31 y.o. female [id number] with IUP at 69w3dby 9 wk UKoreapresenting for r/o PROM. Pt endorses gush of fluid with blood while at dinner table around 23:00 which continued while on the toilet. Patient cleaned herself and came to MAU. She reports positive fetal movement. No abdominal pain but states her contractions have been regular every 3 mins.   Prenatal History/Complications: Patient Active Problem List   Diagnosis Date Noted  . Unwanted fertility 04/20/2016  . Alcohol abuse affecting pregnancy 01/16/2016  . Supervision of high risk pregnancy, antepartum 01/13/2016  . Asthma affecting pregnancy, antepartum 01/13/2016  . History of term stillbirth 02/23/2013  . H/O macrosomia in infant in prior pregnancy, currently pregnant 06/14/2012  . Tobacco smoking affecting pregnancy 06/14/2012   Past Medical History: Past Medical History:  Diagnosis Date  . Abnormal Pap smear   . Asthma   . Hx of chlamydia infection     Past Surgical History: Past Surgical History:  Procedure Laterality Date  . COLPOSCOPY      Obstetrical History: OB History    Gravida Para Term Preterm AB Living   7 3 2 1 3 2    SAB TAB Ectopic Multiple Live Births   2 0 0 0 2      Social History: Social History   Social History  . Marital status: Single    Spouse name: N/A  . Number of children: N/A  . Years of education: N/A   Social History Main Topics  . Smoking status: Former Smoker    Packs/day: 0.25    Types: Cigarettes    Quit date: 11/21/2015  . Smokeless tobacco: Never Used  . Alcohol use Yes     Comment: occasionally until pregnancy  . Drug use: No  . Sexual activity: Yes     Comment: Last sex 06/17/2016   Other Topics Concern  . None   Social History Narrative  . None    Family History: Family History  Problem Relation Age of Onset  . Asthma Daughter   . Hypertension Mother   . Thyroid disease Mother   .  Hypertension Maternal Aunt   . Hypertension Maternal Grandmother   . Thyroid disease Maternal Grandmother   . Heart disease Father   . Seizures Father     Allergies: Allergies  Allergen Reactions  . Latex Other (See Comments)    Bacterial or yeast infection from condoms    Prescriptions Prior to Admission  Medication Sig Dispense Refill Last Dose  . ACCU-CHEK FASTCLIX LANCETS MISC 1 Units by Percutaneous route 4 (four) times daily. 100 each 12 Taking  . acetaminophen (TYLENOL) 160 MG/5ML solution Take 480 mg by mouth every 6 (six) hours as needed for moderate pain.   Not Taking  . albuterol (PROVENTIL HFA;VENTOLIN HFA) 108 (90 Base) MCG/ACT inhaler Inhale 2 puffs into the lungs every 4 (four) hours as needed for wheezing or shortness of breath. 1 Inhaler 0 Taking  . aspirin EC 81 MG tablet Take 81 mg by mouth daily.   Taking  . benzonatate (TESSALON) 200 MG capsule Take 1 capsule (200 mg total) by mouth 3 (three) times daily as needed for cough. (Patient not taking: Reported on 04/28/2016) 20 capsule 0 Not Taking  . Blood Glucose Monitoring Suppl (ACCU-CHEK AVIVA PLUS) w/Device KIT 1 each by Does not apply route 4 (four) times daily. 024.419 check blood sugars 4 times a day 1 kit 0 Taking  .  glucose blood (ACCU-CHEK SMARTVIEW) test strip Use as instructed to check blood sugars 100 each 12 Taking  . ondansetron (ZOFRAN ODT) 8 MG disintegrating tablet Take 1 tablet (8 mg total) by mouth every 8 (eight) hours as needed for nausea or vomiting. (Patient not taking: Reported on 04/28/2016) 20 tablet 0 Not Taking  . Prenatal Vit-Fe Fumarate-FA (PRENATAL VITAMIN PO) Take 1 tablet by mouth daily.    Taking     Review of Systems   All systems reviewed and negative except as stated in HPI  Blood pressure 140/80, pulse 85, temperature 98.2 F (36.8 C), temperature source Oral, resp. rate 19, last menstrual period 09/02/2015, unknown if currently breastfeeding. General appearance: alert,  cooperative and no distress Lungs: clear to auscultation bilaterally Heart: regular rate and rhythm Abdomen: soft, non-tender; bowel sounds normal Extremities: No calf swelling or tenderness Presentation: cephalic Fetal monitoring: FHR 140, moderate variability, accelerations present, no decelerations Uterine activity: regular q3 mins Dilation: 3 Effacement (%): 50 Station: -3 Exam by:: Dr. Yisroel Ramming   Prenatal labs: ABO, Rh: --/--/O POS (08/06 2149) Antibody: Negative Rubella: Immune RPR: NON REAC (01/08 0956)  HBsAg: Negative HIV: NONREACTIVE (01/08 0956)  GBS: Negative 1 hr Glucola: 97/148/84 (Normal) Genetic screening:  Normal Anatomy US: Normal  Prenatal Transfer Tool  Maternal Diabetes: No Genetic Screening: Normal Maternal Ultrasounds/Referrals: Normal Fetal Ultrasounds or other Referrals:  None Maternal Substance Abuse:  Yes:  Type: Smoker Significant Maternal Medications:  None Significant Maternal Lab Results: None  No results found for this or any previous visit (from the past 24 hour(s)).  Patient Active Problem List   Diagnosis Date Noted  . Unwanted fertility 04/20/2016  . Alcohol abuse affecting pregnancy 01/16/2016  . Supervision of high risk pregnancy, antepartum 01/13/2016  . Asthma affecting pregnancy, antepartum 01/13/2016  . History of term stillbirth 02/23/2013  . H/O macrosomia in infant in prior pregnancy, currently pregnant 06/14/2012  . Tobacco smoking affecting pregnancy 06/14/2012    Assessment: Jordan Hall is a 31 y.o. [id number] at 40w3dhere for rule out PROM. No pooling on speculum exam. Fern test negative. Obvious dry blood w/o signs of active bleeding.  #Labor:Monitoring #Pain: No med needed at present #FWB: Category I #ID:  GBS negative #MOF: Both #MOC:BTL (papers signed) #Circ:  N/A  DRandolph Bing DO, PGY-1 06/18/2016, 1:20 AM   CNM attestation:  I have seen and examined this patient; I agree with above documentation  in the resident's note.   Jordan Morrisseyis a 31y.o. [phone] for report of vag bldg at home/possibly leaking fluid  PE: BP 114/72   Pulse 81   Temp 98.5 F (36.9 C) (Oral)   Resp 16   Ht 5' 3"  (1.6 m)   Wt 92.1 kg (203 lb)   LMP 09/02/2015   BMI 35.96 kg/m  Gen: calm comfortable, NAD Resp: normal effort, no distress Abd: gravid  ROS, labs, PMH reviewed Fern neg, no pooling  [facility]: [facility] vs sm abruption Will manage expectantly overnight- having very minimal bldg at time of admission  SSerita Grammes3/11/2016, 3:35 AM

## 2016-06-18 NOTE — Anesthesia Pain Management Evaluation Note (Signed)
  CRNA Pain Management Visit Note  Patient: Jordan Hall, 31 y.o., female  "Hello I am a member of the anesthesia team at [facility]. We have an anesthesia team available at all times to provide care throughout the hospital, including epidural management and anesthesia for C-section. I don't know your plan for the delivery whether it a natural birth, water birth, IV sedation, nitrous supplementation, doula or epidural, but we want to meet your pain goals."   1.Was your pain managed to your expectations on prior hospitalizations?   No   2.What is your expectation for pain management during this hospitalization?     Epidural and IV pain meds  3.How can we help you reach that goal? Pt open to all methods of pain management.  Record the patient's initial score and the patient's pain goal.   Pain: 0  Pain Goal: 5 [facility] wants you to be able to say your pain was always managed very well.  Jordan Hall 06/18/2016

## 2016-06-18 NOTE — Progress Notes (Signed)
AROM w/blood tinged fluid.  IUPC placed.  Early/variable decels.  Avg variability.  Pain w/ctx quickly increased after AROM.  Hopefully labor will progress rapidly.

## 2016-06-18 NOTE — Anesthesia Preprocedure Evaluation (Signed)
Anesthesia Evaluation  Patient identified by MRN, date of birth, ID band Patient awake    Reviewed: Allergy & Precautions, NPO status , Patient's Chart, lab work & pertinent test results  History of Anesthesia Complications Negative for: history of anesthetic complications  Airway Mallampati: III  TM Distance: >3 FB Neck ROM: Full    Dental  (+) Dental Advisory Given   Pulmonary asthma (last inhaler use yesterday) , Current Smoker,    breath sounds clear to auscultation       Cardiovascular negative cardio ROS   Rhythm:Regular Rate:Normal     Neuro/Psych negative neurological ROS     GI/Hepatic negative GI ROS, (+)     substance abuse  alcohol use,   Endo/Other  diabetes (diet controlled), GestationalMorbid obesity  Renal/GU negative Renal ROS     Musculoskeletal   Abdominal (+) + obese,   Peds  Hematology   Anesthesia Other Findings   Reproductive/Obstetrics (+) Pregnancy                             Anesthesia Physical Anesthesia Plan  ASA: II  Anesthesia Plan: Epidural   Post-op Pain Management:    Induction:   Airway Management Planned: Natural Airway  Additional Equipment:   Intra-op Plan:   Post-operative Plan:   Informed Consent: I have reviewed the patients History and Physical, chart, labs and discussed the procedure including the risks, benefits and alternatives for the proposed anesthesia with the patient or authorized representative who has indicated his/her understanding and acceptance.   Dental advisory given  Plan Discussed with:   Anesthesia Plan Comments: (Patient identified. Risks/Benefits/Options discussed with patient including but not limited to bleeding, infection, nerve damage, paralysis, failed block, incomplete pain control, headache, blood pressure changes, nausea, vomiting, reactions to medication both or allergic, itching and postpartum back  pain. Confirmed with bedside nurse the patient's most recent platelet count. Confirmed with patient that they are not currently taking any anticoagulation, have any bleeding history or any family history of bleeding disorders. Patient expressed understanding and wished to proceed. All questions were answered. )        Anesthesia Quick Evaluation

## 2016-06-18 NOTE — Progress Notes (Signed)
Jordan Hall, 30yo female, [phone], at 8641w3d, in labor Augmentation: Cytotec @0836 , Pitocin @1412  US @33w : EFW 2532 p76  S: Comfortable, feeling regular contractions  O: BP 138/79   Pulse 75   Temp 97.2 F (36.2 C) (Axillary)   Resp 18   Ht 5\' 3"  (1.6 m)   Wt 203 lb (92.1 kg)   LMP 09/02/2015   BMI 35.96 kg/m  SVE: 4/80/-3 FHR: 145, moderate variability, +acels, no decels  A: progressing  P: continue care  Adair LaundryAna Carvalho do Amaral MD PGY1

## 2016-06-18 NOTE — Anesthesia Procedure Notes (Signed)
Epidural Patient location during procedure: OB Start time: 06/18/2016 8:26 PM End time: 06/18/2016 8:47 PM  Staffing Anesthesiologist: Jairo BenJACKSON, Weylyn Ricciuti Performed: anesthesiologist   Preanesthetic Checklist Completed: patient identified, surgical consent, pre-op evaluation, timeout performed, IV checked, risks and benefits discussed and monitors and equipment checked  Epidural Patient position: sitting Prep: site prepped and draped and DuraPrep Patient monitoring: blood pressure, continuous pulse ox and heart rate Approach: midline Location: L3-L4 Injection technique: LOR air  Needle:  Needle type: Tuohy  Needle gauge: 17 G Needle length: 9 cm Needle insertion depth: 6.5 cm Catheter type: closed end flexible Catheter size: 19 Gauge Catheter at skin depth: 12 cm Test dose: negative (1% lidocaine)  Assessment Events: blood not aspirated, injection not painful, no injection resistance, negative IV test and no paresthesia  Additional Notes Pt identified in Labor room.  Monitors applied. Working IV access confirmed. Sterile prep, drape lumbar spine.  1% lido local L 3,4.  #17ga Touhy LOR air at 6.5 cm L 3,4, cath in easily to 12 cm skin. Test dose OK, cath dosed and infusion begun.  Patient asymptomatic, VSS, no heme aspirated, tolerated well.  Sandford Craze Kyleena Scheirer, MDReason for block:procedure for pain

## 2016-06-19 LAB — RPR: RPR Ser Ql: NONREACTIVE

## 2016-06-19 LAB — GLUCOSE, CAPILLARY
Glucose-Capillary: 79 mg/dL (ref 65–99)
Glucose-Capillary: 68 mg/dL (ref 65–99)

## 2016-06-19 NOTE — Progress Notes (Signed)
Post Partum Day #1 Subjective: no complaints, up ad lib and tolerating PO; breast and bottlefeeding; plans IUD for contraception PP (may consider BTL later)  Objective: Blood pressure 129/82, pulse 72, temperature 97.5 F (36.4 C), temperature source Oral, resp. rate 18, height 5\' 3"  (1.6 m), weight 92.1 kg (203 lb), last menstrual period 09/02/2015, SpO2 98 %, unknown if currently breastfeeding.  Physical Exam:  General: alert, cooperative and no distress Lochia: appropriate Uterine Fundus: firm DVT Evaluation: No evidence of DVT seen on physical exam.   Recent Labs  06/18/16 0105  HGB 11.5*  HCT 34.2*    Assessment/Plan: Plan for discharge tomorrow   LOS: 1 day   Jordan Hall 06/19/2016, 10:08 PM

## 2016-06-19 NOTE — Progress Notes (Signed)
CSW received consult for ETOH use and hx of IUFD.  CSW reviewed chart and spoke with pediatric teaching service.  CSW is screening out referral as alcohol metabolites were noted at initial visit and not afterward.  Given alcohol is not an illegal substance and there is no documentation that it was used throughout the pregnancy, CSW does not feel it is necessary to address at this time.  Also, CSW is available to provide support to [name] given hx of IUFD if she is exhibiting signs of anxiety and depression, however, CSW does not feel it is appropriate to address her hx of loss unless she has acute concerns.  Please call CSW if current concerns arise or by [name]'s request.

## 2016-06-19 NOTE — Anesthesia Postprocedure Evaluation (Signed)
Anesthesia Post Note  Patient: Jordan AmesKelly Mcclaran  Procedure(s) Performed: * No procedures listed *  Patient location during evaluation: Mother Baby Anesthesia Type: Epidural Level of consciousness: awake and alert and oriented Pain management: pain level controlled Vital Signs Assessment: post-procedure vital signs reviewed and stable Respiratory status: spontaneous breathing and nonlabored ventilation Cardiovascular status: stable Postop Assessment: no headache, no backache, epidural receding, patient able to bend at knees, no signs of nausea or vomiting and adequate PO intake Anesthetic complications: no        Last Vitals:  Vitals:   06/19/16 0114 06/19/16 0406  BP:  112/69  Pulse:  80  Resp:  16  Temp: 37.1 C 36.4 C    Last Pain:  Vitals:   06/19/16 0615  TempSrc:   PainSc: 5    Pain Goal: Patients Stated Pain Goal: 3 (06/18/16 1501)               Rosemarie Galvis Hristova

## 2016-06-19 NOTE — Lactation Note (Signed)
This note was copied from a baby's chart. Lactation Consultation Note  Patient Name: Jordan Hall Today's Date: 06/19/2016 Reason for consult: Initial assessment Breastfeeding consultation services and support information given and reviewed.  Mom desires to both breast and formula feed.  She states the baby latches easily and she knows to put baby to breast with cues.  Encouraged to call with concerns/assist.  Maternal Data    Feeding Feeding Type: Breast Fed Length of feed: 15 min  LATCH Score/Interventions Latch: Grasps breast easily, tongue down, lips flanged, rhythmical sucking.  Audible Swallowing: A few with stimulation  Type of Nipple: Everted at rest and after stimulation  Comfort (Breast/Nipple): Soft / non-tender     Hold (Positioning): No assistance needed to correctly position infant at breast.  LATCH Score: 9  Lactation Tools Discussed/Used     Consult Status Consult Status: Follow-up Date: 06/20/16 Follow-up type: In-patient    Huston FoleyMOULDEN, Jonus Coble S 06/19/2016, 11:25 AM

## 2016-06-20 MED ORDER — IBUPROFEN 600 MG PO TABS: 600.0000 mg | ORAL_TABLET | Freq: Four times a day (QID) | ORAL | 0 refills | Status: DC

## 2016-06-20 MED ORDER — DOCUSATE SODIUM 100 MG PO CAPS: 100.0000 mg | ORAL_CAPSULE | Freq: Two times a day (BID) | ORAL | 0 refills | Status: DC

## 2016-06-20 MED ORDER — FERROUS SULFATE 325 (65 FE) MG PO TABS: 325.0000 mg | ORAL_TABLET | Freq: Two times a day (BID) | ORAL | 3 refills | Status: DC

## 2016-06-20 MED ORDER — ACETAMINOPHEN 325 MG PO TABS: 650.0000 mg | ORAL_TABLET | ORAL | 0 refills | Status: DC | PRN

## 2016-06-20 NOTE — Discharge Summary (Signed)
OB Discharge Summary     Patient Name: Jordan Hall DOB: November 29, 1985 MRN: [id number]  Date of admission: 06/17/2016 Delivering MD: Christin Fudge   Date of discharge: 06/20/2016  Admitting diagnosis: 37 WEEKS ROM CTX Intrauterine pregnancy: [redacted]w[redacted]d    Secondary diagnosis:  Active Problems:   Vaginal bleeding   Vaginal bleeding in pregnancy, third trimester  Additional problems: asthma     Discharge diagnosis: Term Pregnancy Delivered and presumed placental abruption                                                                                                Post partum procedures:none  Augmentation: AROM, Pitocin and Cytotec  Complications: None  Hospital course: Third trimester bldg- concern for abruption- induction of labor with vaginal delivery.  Pt was admitted and observed overnight on 06/18/16 due to vag bldg. FHR Cat 1, irreg ctx. By the morning her cx had not changed and the bldg was minimal-mod, so decision for IOL was made. She was given cytotec followed by Pit and AROM and she progressed to SVD. Presumed partial placental abruption.  Patient had delivery of a Viable infant.  Information for the patient's newborn:  AMemory, Heinrichs[[[id number]] Delivery Method: Vaginal, Spontaneous Delivery (Filed from Delivery Summary)   06/18/2016  Details of delivery can be found in separate delivery note.  Patient had a routine postpartum course. Patient is discharged home 06/20/16.  Physical exam  Vitals:   06/19/16 0406 06/19/16 0900 06/19/16 1842 06/20/16 0602  BP: 112/69 125/72 129/82 (!) 120/58  Pulse: 80 89 72 75  Resp: 16 18 18 18   Temp: 97.6 F (36.4 C) 97.9 F (36.6 C) 97.5 F (36.4 C) 97.8 F (36.6 C)  TempSrc: Oral Oral Oral Oral  SpO2:  98%    Weight:      Height:       General: alert Lochia: appropriate Uterine Fundus: firm Incision: N/A DVT Evaluation: No evidence of DVT seen on physical exam. Labs: Lab Results  Component Value Date    WBC 10.5 06/18/2016   HGB 11.5 (L) 06/18/2016   HCT 34.2 (L) 06/18/2016   MCV 94.7 06/18/2016   PLT 249 06/18/2016   CMP Latest Ref Rng & Units 04/02/2013  Glucose 70 - 99 mg/dL 89  BUN 6 - 23 mg/dL 4(L)  Creatinine 0.50 - 1.10 mg/dL 0.48(L)  Sodium 135 - 145 mEq/L 135  Potassium 3.5 - 5.1 mEq/L 3.0(L)  Chloride 96 - 112 mEq/L 103  CO2 19 - 32 mEq/L 21  Calcium 8.4 - 10.5 mg/dL 8.8  Total Protein 6.0 - 8.3 g/dL -  Total Bilirubin 0.3 - 1.2 mg/dL -  Alkaline Phos 39 - 117 U/L -  AST 0 - 37 U/L -  ALT 0 - 35 U/L -    Discharge instruction: per After Visit Summary and "Baby and Me Booklet".  After visit meds:  Allergies as of 06/20/2016      Reactions   Latex Other (See Comments)   Bacterial or yeast infection from condoms      Medication List  STOP taking these medications   ACCU-CHEK AVIVA PLUS w/Device Kit   ACCU-CHEK FASTCLIX LANCETS Misc   aspirin EC 81 MG tablet   glucose blood test strip Commonly known as:  ACCU-CHEK SMARTVIEW     TAKE these medications   acetaminophen 325 MG tablet Commonly known as:  TYLENOL Take 2 tablets (650 mg total) by mouth every 4 (four) hours as needed (for pain scale < 4).   albuterol 108 (90 Base) MCG/ACT inhaler Commonly known as:  PROVENTIL HFA;VENTOLIN HFA Inhale 2 puffs into the lungs every 4 (four) hours as needed for wheezing or shortness of breath.   docusate sodium 100 MG capsule Commonly known as:  COLACE Take 1 capsule (100 mg total) by mouth 2 (two) times daily.   ferrous sulfate 325 (65 FE) MG tablet Take 1 tablet (325 mg total) by mouth 2 (two) times daily with a meal.   ibuprofen 600 MG tablet Commonly known as:  ADVIL,MOTRIN Take 1 tablet (600 mg total) by mouth every 6 (six) hours.   PRENATAL VITAMIN PO Take 1 tablet by mouth daily.      Follow-up [place] for [facility] Follow up.   Specialty:  Obstetrics and Gynecology Why:  F/u in 4-6 weeks for postpartum Contact  information: [place] [phone]       [facility] Follow up.   Why:  as needed for emergencies Contact information: [address] [id number] [place] [phone]           Diet: routine diet  Activity: Advance as tolerated. Pelvic rest for 6 weeks.   Outpatient follow up:6 weeks Follow up Appt:Future Appointments Date Time Provider [place]  06/29/2016 7:00 AM [facility]ED ROOM [facility]ED None  07/30/2016 1:20 PM Starr [place], CNM [facility]   Follow up Visit:No Follow-up on file.  Postpartum contraception: IUD undecided  Newborn Data: Live born female  Birth Weight: 7 lb 9.9 oz (3455 g) APGAR: 7, 9  Baby Feeding: Bottle Disposition:home with mother   06/20/2016 Allene Pyo do Lorra Hals, MD PGY1  CNM attestation I have seen and examined this patient and agree with above documentation in the resident's note.   Jordan Hall is a 31 y.o. [id number] s/p SVD.   Pain is well controlled.  Plan for birth control is IUD.  Method of Feeding: bottle  PE:  BP (!) 120/58 (BP Location: Left Arm)   Pulse 75   Temp 97.8 F (36.6 C) (Oral)   Resp 18   Ht 5' 3"  (1.6 m)   Wt 92.1 kg (203 lb)   LMP 09/02/2015   SpO2 98%   Breastfeeding? Unknown   BMI 35.96 kg/m  Fundus firm   Recent Labs  06/18/16 0105  HGB 11.5*  HCT 34.2*     Plan: discharge today - postpartum care discussed - f/u clinic in 4-6 weeks for postpartum visit   Serita Grammes, CNM 9:54 AM  06/20/2016

## 2016-06-20 NOTE — Lactation Note (Signed)
This note was copied from a baby's chart. Lactation Consultation Note Baby on bili lights asleep at this time.  LC counseled Mom and dad on how to breast feed in conjuction with bili lights.  Mom states she is breastfeeding then giving formula with the following feed.  Mom states that a few times baby has acted hungry after nursing so she has given formula.  Mom states that baby latches very well and that she doesn't have any discomfort with feeding but does fill her breast getting fuller.  LC assessed breast with mom's permission and they were soft.  LC reviewed engorgement care with mom and provided mom with hand pump and reviewed use and cleaning.  Mom declined LC offer to open pump and have mom demonstrate at this time.  Mom states she's used a pump like this before.  LC reviewed supply and demand and encouraged mom to nurse 8-12 times within a 24 hour period in order stimulate breast in order to produce milk.  Mom understands,  Mom feels infant is feeding very well and that things are going well with the feeding  Method of breast and bottle feeding formula.  Pt. And family encouraged to call out for any questions or assistance regarding feeding.    Patient Name: Boy Francina AmesKelly Roesler ZOXWR'UToday's Date: 06/20/2016 Reason for consult: Follow-up assessment   Maternal Data    Feeding    LATCH Score/Interventions                      Lactation Tools Discussed/Used     Consult Status Consult Status: Follow-up Date: 06/21/16 Follow-up type: In-patient    Maryruth HancockKelly Suzanne [facility] 06/20/2016, 11:52 AM

## 2016-06-20 NOTE — Discharge Instructions (Signed)

## 2016-06-22 ENCOUNTER — Other Ambulatory Visit: Payer: Self-pay | Admitting: Obstetrics and Gynecology

## 2016-06-25 ENCOUNTER — Other Ambulatory Visit: Payer: Self-pay

## 2016-06-29 ENCOUNTER — Inpatient Hospital Stay (HOSPITAL_COMMUNITY): Payer: Medicaid Other

## 2016-07-01 ENCOUNTER — Emergency Department (HOSPITAL_COMMUNITY)
Admission: EM | Admit: 2016-07-01 | Discharge: 2016-07-01 | Disposition: A | Discharge status: Home / Self Care | Payer: Medicaid Other | Attending: Emergency Medicine | Admitting: Emergency Medicine

## 2016-07-01 ENCOUNTER — Encounter (HOSPITAL_COMMUNITY): Payer: Self-pay

## 2016-07-01 ENCOUNTER — Emergency Department (HOSPITAL_COMMUNITY): Payer: Medicaid Other

## 2016-07-01 DIAGNOSIS — F17210 Nicotine dependence, cigarettes, uncomplicated: Secondary | ICD-10-CM | POA: Insufficient documentation

## 2016-07-01 DIAGNOSIS — J111 Influenza due to unidentified influenza virus with other respiratory manifestations: Secondary | ICD-10-CM | POA: Insufficient documentation

## 2016-07-01 DIAGNOSIS — Z79899 Other long term (current) drug therapy: Secondary | ICD-10-CM | POA: Insufficient documentation

## 2016-07-01 DIAGNOSIS — J45909 Unspecified asthma, uncomplicated: Secondary | ICD-10-CM | POA: Diagnosis not present

## 2016-07-01 DIAGNOSIS — Z91040 Latex allergy status: Secondary | ICD-10-CM | POA: Diagnosis not present

## 2016-07-01 LAB — RAPID STREP SCREEN (MED CTR MEBANE ONLY): Streptococcus, Group A Screen (Direct): NEGATIVE

## 2016-07-01 LAB — POC URINE PREG, ED: PREG TEST UR: NEGATIVE

## 2016-07-01 MED ORDER — PREDNISONE 20 MG PO TABS
30.0000 mg | ORAL_TABLET | Freq: Once | ORAL | Status: AC
  Administered 2016-07-01: 30 mg via ORAL
  Filled 2016-07-01: qty 2

## 2016-07-01 MED ORDER — BENZONATATE 100 MG PO CAPS: 100.0000 mg | ORAL_CAPSULE | Freq: Three times a day (TID) | ORAL | 0 refills | Status: DC

## 2016-07-01 MED ORDER — BENZONATATE 100 MG PO CAPS
100.0000 mg | ORAL_CAPSULE | Freq: Once | ORAL | Status: AC
  Administered 2016-07-01: 100 mg via ORAL
  Filled 2016-07-01: qty 1

## 2016-07-01 MED ORDER — ACETAMINOPHEN 325 MG PO TABS
ORAL_TABLET | ORAL | Status: AC
  Filled 2016-07-01: qty 2

## 2016-07-01 MED ORDER — PREDNISONE 20 MG PO TABS: 20.0000 mg | ORAL_TABLET | Freq: Two times a day (BID) | ORAL | 0 refills | Status: DC

## 2016-07-01 MED ORDER — ACETAMINOPHEN 325 MG PO TABS
650.0000 mg | ORAL_TABLET | Freq: Once | ORAL | Status: AC | PRN
  Administered 2016-07-01: 650 mg via ORAL

## 2016-07-01 NOTE — ED Triage Notes (Signed)
Pt endorses cough, sore throat, bodyaches since yesterday morning with chills. Pt's oral temp 101.2 in triage. Breath sounds clear.

## 2016-07-01 NOTE — Discharge Instructions (Signed)
Good handwashing. Wear a mask around your baby, Tessalon for cough.

## 2016-07-01 NOTE — ED Provider Notes (Signed)
[facility]EMER[facility]Y DEPT Provider Note   CSN: [id number] Arrival date & time: 07/01/16  1728  By signing my name below, I, Marnette Burgess Long, attest that this documentation has been prepared under the direction and in the presence of Rolland Porter, MD. Electronically Signed: Marnette Burgess Long, Scribe. 07/01/2016. 7:03 PM.  History   Chief Complaint Chief Complaint  Patient presents with  . Influenza   The history is provided by the patient and medical records. No language interpreter was used.    HPI Comments:  Jordan Hall is a 31 y.o. female with a PMHx of Chlamydia and Asthma, who presents to the Emergency Department complaining of persistent, gradually worsening, cough onset yesterday morning. Pt reports waking up with her cough and associated symptoms yesterday morning with the cough gradually worsening since onset. Pt has associated symptoms of chills, fever (TMax 101.2), sore throat, rhinorrhea, HA, generalized myalgias, wheezing, and back pain. She tried an inhaler at home with mild relief of her symptoms. No exacerbating factors stated. Pt denies nausea, vomiting, and any other complaints at this time. Pt is a current some day smoker but does note she received a seasonal influenza vaccination.   Past Medical History:  Diagnosis Date  . Abnormal Pap smear   . Asthma   . Hx of chlamydia infection    Patient Active Problem List   Diagnosis Date Noted  . Vaginal bleeding 06/18/2016  . Vaginal bleeding in pregnancy, third trimester 06/18/2016  . Unwanted fertility 04/20/2016  . Alcohol abuse affecting pregnancy 01/16/2016  . Supervision of high risk pregnancy, antepartum 01/13/2016  . Asthma affecting pregnancy, antepartum 01/13/2016  . History of term stillbirth 02/23/2013  . H/O macrosomia in infant in prior pregnancy, currently pregnant 06/14/2012  . Tobacco smoking affecting pregnancy 06/14/2012   Past Surgical History:  Procedure Laterality Date  . COLPOSCOPY      OB History     Gravida Para Term Preterm AB Living   7 4 3 1 3 3    SAB TAB Ectopic Multiple Live Births   2 0 0 0 3     Home Medications    Prior to Admission medications   Medication Sig Start Date End Date Taking? Authorizing Provider  acetaminophen (TYLENOL) 325 MG tablet Take 2 tablets (650 mg total) by mouth every 4 (four) hours as needed (for pain scale < 4). 06/20/16   Nehemiah Settle Janine Ores, MD  albuterol (PROVENTIL HFA;VENTOLIN HFA) 108 (90 Base) MCG/ACT inhaler Inhale 2 puffs into the lungs every 4 (four) hours as needed for wheezing or shortness of breath. 03/11/16   [place] Bing, MD  benzonatate (TESSALON) 100 MG capsule Take 1 capsule (100 mg total) by mouth every 8 (eight) hours. 07/01/16   Rolland Porter, MD  docusate sodium (COLACE) 100 MG capsule Take 1 capsule (100 mg total) by mouth 2 (two) times daily. 06/20/16   Ana Foye Clock Janine Ores, MD  ferrous sulfate 325 (65 FE) MG tablet Take 1 tablet (325 mg total) by mouth 2 (two) times daily with a meal. 06/20/16   Ana Rogelio Seen, MD  ibuprofen (ADVIL,MOTRIN) 600 MG tablet Take 1 tablet (600 mg total) by mouth every 6 (six) hours. 06/20/16   Ana Foye Clock Janine Ores, MD  predniSONE (DELTASONE) 20 MG tablet Take 1 tablet (20 mg total) by mouth 2 (two) times daily with a meal. 07/01/16   Rolland Porter, MD  Prenatal Vit-Fe Fumarate-FA (PRENATAL VITAMIN PO) Take 1 tablet by mouth daily.     Historical  Provider, MD   Family History Family History  Problem Relation Age of Onset  . Asthma Daughter   . Hypertension Mother   . Thyroid disease Mother   . Hypertension Maternal Aunt   . Hypertension Maternal Grandmother   . Thyroid disease Maternal Grandmother   . Heart disease Father   . Seizures Father    Social History Social History  Substance Use Topics  . Smoking status: Current Some Day Smoker    Packs/day: 0.50    Types: Cigarettes  . Smokeless tobacco: Never Used  . Alcohol use Yes     Comment: occasionally until pregnancy    Allergies   Latex   Review of Systems Review of Systems  Constitutional: Positive for chills and fever. Negative for appetite change, diaphoresis and fatigue.  HENT: Positive for rhinorrhea and sore throat. Negative for mouth sores and trouble swallowing.   Eyes: Negative for visual disturbance.  Respiratory: Positive for cough and wheezing. Negative for chest tightness and shortness of breath.   Cardiovascular: Negative for chest pain.  Gastrointestinal: Negative for abdominal distention, abdominal pain, diarrhea, nausea and vomiting.  Endocrine: Negative for polydipsia, polyphagia and polyuria.  Genitourinary: Negative for dysuria, frequency and hematuria.  Musculoskeletal: Positive for back pain and myalgias (generalized). Negative for gait problem.  Skin: Negative for color change, pallor and rash.  Neurological: Positive for headaches. Negative for dizziness, syncope and light-headedness.  Hematological: Does not bruise/bleed easily.  Psychiatric/Behavioral: Negative for behavioral problems and confusion.   Physical Exam Updated Vital Signs BP (!) 159/102 (BP Location: Left Arm)   Pulse 100   Temp (!) 101.2 F (38.4 C) (Oral)   Resp 18   Ht 5\' 4"  (1.626 m)   Wt 185 lb (83.9 kg)   LMP 09/02/2015   SpO2 96%   Breastfeeding? No   BMI 31.76 kg/m   Physical Exam  Constitutional: She is oriented to person, place, and time. She appears well-developed and well-nourished. No distress.  HENT:  Head: Normocephalic.  Eyes: Conjunctivae are normal. Pupils are equal, round, and reactive to light. No scleral icterus.  Neck: Normal range of motion. Neck supple. No thyromegaly present.  Cardiovascular: Normal rate and regular rhythm.  Exam reveals no gallop and no friction rub.   No murmur heard. Pulmonary/Chest: Effort normal and breath sounds normal. No respiratory distress. She has no wheezes. She has no rales.  Mild wheeze with cough.   Abdominal: Soft. Bowel sounds are  normal. She exhibits no distension. There is no tenderness. There is no rebound.  Musculoskeletal: Normal range of motion.  Neurological: She is alert and oriented to person, place, and time.  Skin: Skin is warm and dry. No rash noted.  Psychiatric: She has a normal mood and affect. Her behavior is normal.   ED Treatments / Results  DIAGNOSTIC STUDIES:  Oxygen Saturation is 96% on RA, adequate by my interpretation.    COORDINATION OF CARE:  7:00 PM Discussed treatment plan with pt at bedside including CXR and Tessalon and pt agreed to plan.  Labs (all labs ordered are listed, but only abnormal results are displayed) Labs Reviewed  RAPID STRE[facility] SCREEN (NOT [facility])  CULTURE, GROUP A STREP [facility])  POC URINE PREG, ED    EKG  EKG Interpretation None       Radiology Dg Chest 2 View  Result Date: 07/01/2016 CLINICAL DATA:  Neck pain, cough, fever EXAM: CHEST  2 VIEW COMPARISON:  02/24/2015 FINDINGS: Cardiomediastinal silhouette is stable. No infiltrate or  pleural effusion. No pulmonary edema. Mild perihilar bronchitic changes. Bony thorax is unremarkable. IMPRESSION: No infiltrate or pulmonary edema. Mild perihilar bronchitic changes. Electronically Signed   By: Natasha Mead M.D.   On: 07/01/2016 18:33    Procedures Procedures (including critical care time)  Medications Ordered in ED Medications  acetaminophen (TYLENOL) 325 MG tablet (not administered)  predniSONE (DELTASONE) tablet 30 mg (not administered)  benzonatate (TESSALON) capsule 100 mg (not administered)  acetaminophen (TYLENOL) tablet 650 mg (650 mg Oral Given 07/01/16 1802)     Initial Impression / Assessment and Plan / ED Course  I have reviewed the triage vital signs and the nursing notes.  Pertinent labs & imaging results that were available during my care of the patient were reviewed by me and considered in my medical decision making (see chart for details).     Pt with faint wheezing. CXR c bronchiole  inflammation, but no infiltrate. Discussed Tamiflu, but pt declines after conversation about efficacy, side effects, etc.  Final Clinical Impressions(s) / ED Diagnoses   Final diagnoses:  Influenza    Pt with 2w/o son at home. Advised mask, good handwashing, hand sanitizers.  New Prescriptions New Prescriptions   BENZONATATE (TESSALON) 100 MG CAPSULE    Take 1 capsule (100 mg total) by mouth every 8 (eight) hours.   PREDNISONE (DELTASONE) 20 MG TABLET    Take 1 tablet (20 mg total) by mouth 2 (two) times daily with a meal.   I personally performed the services described in this documentation, which was scribed in my presence. The recorded information has been reviewed and is accurate.     Rolland Porter, MD 07/01/16 Izell Meadow

## 2016-07-04 LAB — CULTURE, GROUP A STREP (THRC)

## 2016-07-30 ENCOUNTER — Ambulatory Visit (INDEPENDENT_AMBULATORY_CARE_PROVIDER_SITE_OTHER): Payer: Medicaid Other | Admitting: Student

## 2016-07-30 VITALS — BP 143/88 | HR 64 | Ht 64.0 in | Wt 182.0 lb

## 2016-07-30 DIAGNOSIS — Z30430 Encounter for insertion of intrauterine contraceptive device: Secondary | ICD-10-CM

## 2016-07-30 DIAGNOSIS — Z3202 Encounter for pregnancy test, result negative: Secondary | ICD-10-CM | POA: Diagnosis not present

## 2016-07-30 DIAGNOSIS — O0990 Supervision of high risk pregnancy, unspecified, unspecified trimester: Secondary | ICD-10-CM

## 2016-07-30 LAB — POCT PREGNANCY, URINE: Preg Test, Ur: NEGATIVE

## 2016-07-30 MED ORDER — PARAGARD INTRAUTERINE COPPER IU IUD
1.0000 | INTRAUTERINE_SYSTEM | Freq: Once | INTRAUTERINE | Status: AC
  Administered 2016-07-30: 1 via INTRAUTERINE

## 2016-07-30 NOTE — Patient Instructions (Signed)

## 2016-07-30 NOTE — Progress Notes (Signed)
Subjective:     Jordan Hall is a 31 y.o. female who presents for a postpartum visit. She is 6 weeks postpartum following a spontaneous vaginal delivery. I have fully reviewed the prenatal and intrapartum course. The delivery was at 37.3 gestational weeks. Outcome: spontaneous vaginal delivery. Anesthesia: epidural. Postpartum course has been unremarkable. Baby's course has been unremarkable. Baby is feeding by bottle - Similac Advance. Bleeding no bleeding. Bowel function is normal. Bladder function is normal. Patient is sexually active. Contraception method is condoms. Depression/anxiety screening: negative. Patient denies headaches, dizziness, or blurry vision.  The following portions of the patient's history were reviewed and updated as appropriate: allergies, current medications, past family history, past medical history, past social history, past surgical history and problem list.  Review of Systems Pertinent items noted in HPI and remainder of comprehensive ROS otherwise negative.   Objective:   Vitals:   07/30/16 1412 07/30/16 1525  BP: (!) 143/88 (!) 143/88  Pulse:        General:  alert, cooperative and no distress   Breasts:  negative  Lungs: clear to auscultation bilaterally  Heart:  regular rate and rhythm, S1, S2 normal, no murmur, click, rub or gallop  Abdomen: soft, non-tender; bowel sounds normal; no masses,  no organomegaly   Vulva:  normal  Vagina: not evaluated  Cervix:  multiparous appearance, no cervical motion tenderness and no lesions  Corpus: not examined  Adnexa:  normal adnexa  Rectal Exam: Not performed.        Procedure note:  IUD insertion:   I did  evaluate her contraindications to IUD placement: There is no current pregnancy,  there is no copper allergy for Paragard users,  or no mucopurulent cervicitis. We discussed the risks, benefits and alternatives to the IUD. The IUD is being placed as (post partum Contraception . I have answered all  her questions about possible infection, complications and fertility after and during use. The risks we discussed included: bleeding and infection post procedure, risk for expulsion and the very small risk of pregnancy while using the IUD. Patient has signed a consent and it is to be scanned into the record. Her Procedure Note: Time out taken: 1421  Team: Luna Kitchens CNM and Marylynn Pearson RN Donell Sliwinski date of birth confirmed  Procedure: IUD insertion Confirmed by patient and team YES  Position correct for procedure YES  Equipment for procedure available YES  A no touch technique was used throughout the procedure. A speculum was placed into vagina and cervix was cleaned with betadine.  The use of os finders or dilators was needed.  The IUD was loaded in a sterile manner and advanced into position. The string was visualized and cut to 2 cm. Patient did tolerate the procedure well. No complications were noted. She was instructed that she may have intercourse without other birth control and verbalized understanding that the IUD does not protect against STI's. Pt. was instructed to return as needed for follow up care. I advised her to return sooner if any fever, pelvic pain or abnormal discharge developed. She clearly verbalized understanding of these instructions. I instructed her to take ibuprofen prn pain or cramping and that her first few menstrual cycles may be heavier than usual. She was given a detailed instruction sheet about her IUD.  Assessment:    Normal postpartum exam with successful IUD placement. Pap smear not done at today's visit.  Plan:    1. Contraception: IUD. Paraguard inserted today.  2. Follow up  in: 1 year or as needed.

## 2016-08-17 ENCOUNTER — Encounter (HOSPITAL_COMMUNITY): Payer: Self-pay

## 2016-08-17 ENCOUNTER — Emergency Department (HOSPITAL_COMMUNITY)
Admission: EM | Admit: 2016-08-17 | Discharge: 2016-08-18 | Disposition: A | Discharge status: Home / Self Care | Payer: Medicaid Other | Attending: Emergency Medicine | Admitting: Emergency Medicine

## 2016-08-17 DIAGNOSIS — F17210 Nicotine dependence, cigarettes, uncomplicated: Secondary | ICD-10-CM | POA: Diagnosis not present

## 2016-08-17 DIAGNOSIS — J45909 Unspecified asthma, uncomplicated: Secondary | ICD-10-CM | POA: Diagnosis present

## 2016-08-17 DIAGNOSIS — Z91040 Latex allergy status: Secondary | ICD-10-CM | POA: Insufficient documentation

## 2016-08-17 DIAGNOSIS — Z7982 Long term (current) use of aspirin: Secondary | ICD-10-CM | POA: Diagnosis not present

## 2016-08-17 DIAGNOSIS — J4541 Moderate persistent asthma with (acute) exacerbation: Secondary | ICD-10-CM | POA: Insufficient documentation

## 2016-08-17 MED ORDER — ALBUTEROL SULFATE (2.5 MG/3ML) 0.083% IN NEBU
INHALATION_SOLUTION | RESPIRATORY_TRACT | Status: AC
  Filled 2016-08-17: qty 6

## 2016-08-17 MED ORDER — ALBUTEROL SULFATE (2.5 MG/3ML) 0.083% IN NEBU
5.0000 mg | INHALATION_SOLUTION | Freq: Once | RESPIRATORY_TRACT | Status: AC
  Administered 2016-08-17: 5 mg via RESPIRATORY_TRACT

## 2016-08-17 NOTE — ED Triage Notes (Signed)
Pt states she is having an asthma exacerbation which is causing her chest to feel tight. Wheezing auscultated bilaterally.

## 2016-08-17 NOTE — ED Notes (Signed)
Pt states the albuterol txt has helped her and she feels "a lot better" and the tightness in her chest has subsided.

## 2016-08-18 ENCOUNTER — Emergency Department (HOSPITAL_COMMUNITY): Payer: Medicaid Other

## 2016-08-18 MED ORDER — IPRATROPIUM-ALBUTEROL 0.5-2.5 (3) MG/3ML IN SOLN: 3.0000 mL | RESPIRATORY_TRACT | Status: DC | PRN

## 2016-08-18 MED ORDER — IPRATROPIUM-ALBUTEROL 0.5-2.5 (3) MG/3ML IN SOLN
3.0000 mL | Freq: Once | RESPIRATORY_TRACT | Status: AC
Start: 2016-08-18 — End: 2016-08-18
  Administered 2016-08-18: 3 mL via RESPIRATORY_TRACT
  Filled 2016-08-18: qty 3

## 2016-08-18 MED ORDER — ALBUTEROL SULFATE HFA 108 (90 BASE) MCG/ACT IN AERS: 1.0000 | INHALATION_SPRAY | Freq: Four times a day (QID) | RESPIRATORY_TRACT | 0 refills | Status: AC | PRN

## 2016-08-18 MED ORDER — METHYLPREDNISOLONE SODIUM SUCC 125 MG IJ SOLR
125.0000 mg | Freq: Once | INTRAMUSCULAR | Status: AC
  Administered 2016-08-18: 125 mg via INTRAVENOUS
  Filled 2016-08-18: qty 2

## 2016-08-18 MED ORDER — ACETAMINOPHEN 325 MG PO TABS: 325.0000 mg | ORAL_TABLET | Freq: Once | ORAL | Status: DC

## 2016-08-18 MED ORDER — IPRATROPIUM-ALBUTEROL 0.5-2.5 (3) MG/3ML IN SOLN: 3.0000 mL | Freq: Four times a day (QID) | RESPIRATORY_TRACT | Status: DC

## 2016-08-18 MED ORDER — PREDNISONE 20 MG PO TABS: ORAL_TABLET | ORAL | 0 refills | Status: AC

## 2016-08-18 MED ORDER — BENZONATATE 100 MG PO CAPS: 100.0000 mg | ORAL_CAPSULE | Freq: Three times a day (TID) | ORAL | 0 refills | Status: AC

## 2016-08-18 MED ORDER — ALBUTEROL SULFATE (2.5 MG/3ML) 0.083% IN NEBU
5.0000 mg | INHALATION_SOLUTION | Freq: Once | RESPIRATORY_TRACT | Status: AC
  Administered 2016-08-18: 5 mg via RESPIRATORY_TRACT
  Filled 2016-08-18: qty 6

## 2016-08-18 MED ORDER — ACETAMINOPHEN 325 MG PO TABS
650.0000 mg | ORAL_TABLET | Freq: Once | ORAL | Status: AC
  Administered 2016-08-18: 650 mg via ORAL
  Filled 2016-08-18: qty 2

## 2016-08-18 NOTE — Progress Notes (Signed)
Peak flow pre treatment 150 with good effort

## 2016-08-18 NOTE — ED Provider Notes (Signed)
[facility] Provider Note   CSN: [id number] Arrival date & time: 08/17/16  2105     History   Chief Complaint Chief Complaint  Patient presents with  . Asthma    HPI Jordan Hall is a 31 y.o. female.  Patient with history of asthma.  Her current exacerbation started yesterday. She misplaced her medication (albuterol MDI, tessalon, prednisone) while visiting family this week, and was unable to follow treatment plan. Fever, no chills. Non-productive cough.  Back pain.   The history is provided by the patient. No language interpreter was used.  Asthma  This is a recurrent problem. The current episode started yesterday. The problem occurs constantly. The problem has been gradually worsening. Associated symptoms include shortness of breath.    Past Medical History:  Diagnosis Date  . Abnormal Pap smear   . Asthma   . Hx of chlamydia infection     Patient Active Problem List   Diagnosis Date Noted  . Encounter for insertion of ParaGard IUD 07/30/2016    Past Surgical History:  Procedure Laterality Date  . COLPOSCOPY      OB History    Gravida Para Term Preterm AB Living   7 4 3 1 3 3    SAB TAB Ectopic Multiple Live Births   2 0 0 0 3       Home Medications    Prior to Admission medications   Medication Sig Start Date End Date Taking? Authorizing Provider  aspirin EC 81 MG tablet Take 81 mg by mouth daily.    [provider]    Family History Family History  Problem Relation Age of Onset  . Asthma Daughter   . Hypertension Mother   . Thyroid disease Mother   . Hypertension Maternal Aunt   . Hypertension Maternal Grandmother   . Thyroid disease Maternal Grandmother   . Heart disease Father   . Seizures Father     Social History Social History  Substance Use Topics  . Smoking status: Current Some Day Smoker    Packs/day: 0.50    Types: Cigarettes  . Smokeless tobacco: Never Used  . Alcohol use Yes     Comment: occasionally until  pregnancy     Allergies   Latex   Review of Systems Review of Systems  Constitutional: Positive for chills.  Respiratory: Positive for chest tightness, shortness of breath and wheezing.   Musculoskeletal: Positive for back pain.  All other systems reviewed and are negative.    Physical Exam Updated Vital Signs BP (!) 148/96 (BP Location: Left Arm)   Pulse 97   Temp 99 F (37.2 C) (Oral)   Resp 20   SpO2 93%   Physical Exam  Constitutional: She is oriented to person, place, and time. She appears well-developed and well-nourished.  HENT:  Head: Normocephalic.  Eyes: Conjunctivae are normal.  Neck: Neck supple.  Cardiovascular: Normal rate and regular rhythm.   Pulmonary/Chest: She has wheezes. She exhibits tenderness.  Abdominal: Soft. Bowel sounds are normal.  Musculoskeletal: She exhibits no edema.  Neurological: She is alert and oriented to person, place, and time.  Skin: Skin is warm and dry.  Psychiatric: She has a normal mood and affect.  Nursing note and vitals reviewed.    ED Treatments / Results  Labs (all labs ordered are listed, but only abnormal results are displayed) Labs Reviewed - No data to display  EKG  EKG Interpretation None       Radiology Dg Chest 2 View  Result Date: 08/18/2016 CLINICAL DATA:  Acute onset of shortness of breath, cough and wheezing. Initial encounter. EXAM: CHEST  2 VIEW COMPARISON:  Chest radiograph from 07/01/2016 FINDINGS: The lungs are well-aerated. Peribronchial thickening is noted. There is no evidence of focal opacification, pleural effusion or pneumothorax. The heart is normal in size; the mediastinal contour is within normal limits. No acute osseous abnormalities are seen. IMPRESSION: Peribronchial thickening noted.  Lungs otherwise clear. Electronically Signed   By: Roanna Raider M.D.   On: 08/18/2016 00:53    Procedures Procedures (including critical care time)  Medications Ordered in ED Medications    albuterol (PROVENTIL) (2.5 MG/3ML) 0.083% nebulizer solution 5 mg (5 mg Nebulization Given 08/17/16 2112)     Initial Impression / Assessment and Plan / ED Course  I have reviewed the triage vital signs and the nursing notes.  Pertinent labs & imaging results that were available during my care of the patient were reviewed by me and considered in my medical decision making (see chart for details).     No current signs of respiratory distress. Lung exam improved after nebulizer treatment. Prednisone given in the ED and pt will bd dc with 5 day burst. Pt states they are breathing at baseline. Pt has been instructed to continue using prescribed medications and to speak with PCP about today's exacerbation.   Final Clinical Impressions(s) / ED Diagnoses   Final diagnoses:  Moderate persistent asthma with exacerbation    New Prescriptions New Prescriptions   ALBUTEROL (PROVENTIL HFA;VENTOLIN HFA) 108 (90 BASE) MCG/ACT INHALER    Inhale 1-2 puffs into the lungs every 6 (six) hours as needed for wheezing or shortness of breath.   BENZONATATE (TESSALON) 100 MG CAPSULE    Take 1 capsule (100 mg total) by mouth every 8 (eight) hours.   PREDNISONE (DELTASONE) 20 MG TABLET    3 tabs po day one, then 2 tabs daily x 4 days     Felicie Morn, NP 08/18/16 4098    Marily Memos, MD 08/18/16 1850

## 2016-08-18 NOTE — ED Notes (Signed)
Respiratory at bedside.

## 2016-08-18 NOTE — ED Notes (Signed)
Patient transported to X-ray 

## 2016-08-18 NOTE — ED Notes (Signed)
Pt back from x-ray.

## 2016-08-18 NOTE — Progress Notes (Signed)
Peak flow post treatment 200

## 2016-09-01 ENCOUNTER — Other Ambulatory Visit (HOSPITAL_COMMUNITY)
Admission: RE | Admit: 2016-09-01 | Discharge: 2016-09-01 | Disposition: A | Discharge status: Home / Self Care | Payer: Medicaid Other | Source: Ambulatory Visit | Attending: Family Medicine | Admitting: Family Medicine

## 2016-09-01 ENCOUNTER — Ambulatory Visit (INDEPENDENT_AMBULATORY_CARE_PROVIDER_SITE_OTHER): Payer: Medicaid Other | Admitting: *Deleted

## 2016-09-01 DIAGNOSIS — Z3200 Encounter for pregnancy test, result unknown: Secondary | ICD-10-CM

## 2016-09-01 DIAGNOSIS — Z113 Encounter for screening for infections with a predominantly sexual mode of transmission: Secondary | ICD-10-CM

## 2016-09-01 DIAGNOSIS — N898 Other specified noninflammatory disorders of vagina: Secondary | ICD-10-CM

## 2016-09-01 DIAGNOSIS — Z3202 Encounter for pregnancy test, result negative: Secondary | ICD-10-CM

## 2016-09-01 NOTE — Progress Notes (Signed)
Pt reports having vaginal discharge and odor x2 weeks.  She also wanted to be tested for possible pregnancy - had IUD inserted on 4/19.  She denies sx of pregnancy. Pt informed of negative UPT today. Self vaginal swab performed and sent to lab.  She will be called with results.

## 2016-09-02 LAB — CERVICOVAGINAL ANCILLARY ONLY
BACTERIAL VAGINITIS: POSITIVE — AB
CANDIDA VAGINITIS: NEGATIVE
CHLAMYDIA, DNA PROBE: NEGATIVE
Neisseria Gonorrhea: NEGATIVE
Trichomonas: NEGATIVE

## 2016-09-03 ENCOUNTER — Other Ambulatory Visit: Payer: Self-pay | Admitting: Family Medicine

## 2016-09-03 ENCOUNTER — Telehealth: Payer: Self-pay | Admitting: General Practice

## 2016-09-03 MED ORDER — METRONIDAZOLE 500 MG PO TABS
500.0000 mg | ORAL_TABLET | Freq: Two times a day (BID) | ORAL | 0 refills | Status: DC
Start: 2016-09-03 — End: 2016-11-03

## 2016-09-03 NOTE — Telephone Encounter (Signed)
-----   Message from Levie HeritageJacob J Stinson, DO sent at 09/03/2016 10:45 AM EDT ----- BV - please let pt know that flagyl sent to pharmacy. No alcohol while on antibiotics.

## 2016-09-03 NOTE — Telephone Encounter (Signed)
Called and informed patient of results, medication sent to pharmacy & to avoid alcohol while on flagyl. Patient verbalized understanding & had no questions

## 2016-10-19 ENCOUNTER — Encounter: Payer: Self-pay | Admitting: General Practice

## 2016-10-19 ENCOUNTER — Ambulatory Visit: Payer: Self-pay | Admitting: Medical

## 2016-10-19 NOTE — Progress Notes (Unsigned)
Patient no showed for appt today. Can reschedule on her own per Vonzella NippleJulie Wenzel

## 2016-11-03 ENCOUNTER — Telehealth: Payer: Self-pay | Admitting: General Practice

## 2016-11-03 DIAGNOSIS — N898 Other specified noninflammatory disorders of vagina: Secondary | ICD-10-CM

## 2016-11-03 MED ORDER — METRONIDAZOLE 500 MG PO TABS: 500.0000 mg | ORAL_TABLET | Freq: Two times a day (BID) | ORAL | 0 refills | Status: DC

## 2016-11-03 NOTE — Telephone Encounter (Signed)
-----   Message from Jonathon BellowsBeronica Mendez sent at 11/02/2016  8:53 AM EDT ----- Patient called requesting call back, due to foul odor and discharge. Can Rx be sent or does she need to come in for swab?   Please help and Thanks!@

## 2016-11-03 NOTE — Telephone Encounter (Signed)
Called patient and she states she is having a bad odor & discharge. Patient states this feels like when she had BV back in May. Med ordered & sent in per protocol. Informed patient of Rx and discussed BV prevention strategies. Patient verbalized understanding & had no questions

## 2016-11-13 ENCOUNTER — Ambulatory Visit: Payer: Self-pay | Admitting: Obstetrics and Gynecology

## 2016-11-13 ENCOUNTER — Telehealth: Payer: Self-pay | Admitting: Obstetrics and Gynecology

## 2016-11-13 NOTE — Telephone Encounter (Signed)
Patient called to say she overslept, and wanted to get rescheduled. Informed patient of next available appointment. Patient stated she would find another provider.

## 2017-01-29 ENCOUNTER — Encounter: Payer: Self-pay | Admitting: *Deleted

## 2017-01-29 ENCOUNTER — Ambulatory Visit: Payer: Self-pay | Admitting: Medical

## 2017-01-29 NOTE — Progress Notes (Signed)
Jordan Hall missed her appointment for iud removal . Per discussion with Vonzella NippleJulie Wenzel, PA no need to call patient , but may reschedule if she calls.

## 2017-02-08 ENCOUNTER — Emergency Department (HOSPITAL_COMMUNITY)
Admission: EM | Admit: 2017-02-08 | Discharge: 2017-02-08 | Disposition: A | Discharge status: Home / Self Care | Payer: Medicaid Other | Attending: Emergency Medicine | Admitting: Emergency Medicine

## 2017-02-08 ENCOUNTER — Encounter (HOSPITAL_COMMUNITY): Payer: Self-pay

## 2017-02-08 ENCOUNTER — Emergency Department (HOSPITAL_COMMUNITY): Payer: Medicaid Other

## 2017-02-08 DIAGNOSIS — Z7982 Long term (current) use of aspirin: Secondary | ICD-10-CM | POA: Diagnosis not present

## 2017-02-08 DIAGNOSIS — J181 Lobar pneumonia, unspecified organism: Secondary | ICD-10-CM | POA: Diagnosis not present

## 2017-02-08 DIAGNOSIS — J45909 Unspecified asthma, uncomplicated: Secondary | ICD-10-CM | POA: Diagnosis not present

## 2017-02-08 DIAGNOSIS — Z79899 Other long term (current) drug therapy: Secondary | ICD-10-CM | POA: Insufficient documentation

## 2017-02-08 DIAGNOSIS — F17210 Nicotine dependence, cigarettes, uncomplicated: Secondary | ICD-10-CM | POA: Diagnosis not present

## 2017-02-08 DIAGNOSIS — J189 Pneumonia, unspecified organism: Secondary | ICD-10-CM

## 2017-02-08 DIAGNOSIS — R05 Cough: Secondary | ICD-10-CM | POA: Diagnosis present

## 2017-02-08 MED ORDER — IPRATROPIUM-ALBUTEROL 0.5-2.5 (3) MG/3ML IN SOLN
3.0000 mL | Freq: Once | RESPIRATORY_TRACT | Status: AC
  Administered 2017-02-08: 3 mL via RESPIRATORY_TRACT
  Filled 2017-02-08: qty 3

## 2017-02-08 MED ORDER — ACETAMINOPHEN 325 MG PO TABS
ORAL_TABLET | ORAL | Status: AC
  Filled 2017-02-08: qty 2

## 2017-02-08 MED ORDER — ALBUTEROL SULFATE HFA 108 (90 BASE) MCG/ACT IN AERS: 2.0000 | INHALATION_SPRAY | RESPIRATORY_TRACT | 0 refills | Status: AC | PRN

## 2017-02-08 MED ORDER — ACETAMINOPHEN 325 MG PO TABS
650.0000 mg | ORAL_TABLET | Freq: Once | ORAL | Status: AC | PRN
  Administered 2017-02-08: 650 mg via ORAL

## 2017-02-08 MED ORDER — ALBUTEROL SULFATE (2.5 MG/3ML) 0.083% IN NEBU
5.0000 mg | INHALATION_SOLUTION | Freq: Once | RESPIRATORY_TRACT | Status: AC
  Administered 2017-02-08: 5 mg via RESPIRATORY_TRACT

## 2017-02-08 MED ORDER — AZITHROMYCIN 250 MG PO TABS: 250.0000 mg | ORAL_TABLET | Freq: Every day | ORAL | 0 refills | Status: AC

## 2017-02-08 MED ORDER — ALBUTEROL SULFATE (2.5 MG/3ML) 0.083% IN NEBU
INHALATION_SOLUTION | RESPIRATORY_TRACT | Status: AC
  Filled 2017-02-08: qty 6

## 2017-02-08 MED ORDER — ALBUTEROL SULFATE HFA 108 (90 BASE) MCG/ACT IN AERS
2.0000 | INHALATION_SPRAY | Freq: Once | RESPIRATORY_TRACT | Status: AC
  Administered 2017-02-08: 2 via RESPIRATORY_TRACT
  Filled 2017-02-08: qty 6.7

## 2017-02-08 NOTE — ED Notes (Addendum)
Patient currently receiving breathing treatment in triage.

## 2017-02-08 NOTE — ED Notes (Signed)
Pt states feels beter

## 2017-02-08 NOTE — ED Provider Notes (Signed)
[facility] EMERGENCY DEPARTMENT Provider Note   CSN: [id number] Arrival date & time: 02/08/17  0840     History   Chief Complaint Chief Complaint  Patient presents with  . Cough    HPI Avian Greenawalt is a 31 y.o. female.  HPI   Mitra Duling is a 31 y.o. female, with a history of asthma, presenting to the ED with nonproductive cough, body aches, fever, and chills for the last 3 days. Has been taking tessalon, but no other medications. Denies shortness of breath, chest pain, N/V/D, abdominal pain, rashes, or any other complaints.  States she is out of her albuterol inhaler.    Past Medical History:  Diagnosis Date  . Abnormal Pap smear   . Asthma   . Hx of chlamydia infection     Patient Active Problem List   Diagnosis Date Noted  . Encounter for insertion of ParaGard IUD 07/30/2016    Past Surgical History:  Procedure Laterality Date  . COLPOSCOPY      OB History    Gravida Para Term Preterm AB Living   7 4 3 1 3 3    SAB TAB Ectopic Multiple Live Births   2 0 0 0 3       Home Medications    Prior to Admission medications   Medication Sig Start Date End Date Taking? Authorizing Provider  albuterol (PROVENTIL HFA;VENTOLIN HFA) 108 (90 Base) MCG/ACT inhaler Inhale 1-2 puffs into the lungs every 6 (six) hours as needed for wheezing or shortness of breath. 08/18/16   Felicie Morn, NP  albuterol (PROVENTIL HFA;VENTOLIN HFA) 108 (90 Base) MCG/ACT inhaler Inhale 2 puffs into the lungs every 4 (four) hours as needed for wheezing or shortness of breath. 02/08/17   Kahlie Deutscher, Hillard Danker, PA-C  aspirin EC 81 MG tablet Take 81 mg by mouth daily.    [provider]  azithromycin (ZITHROMAX) 250 MG tablet Take 1 tablet (250 mg total) by mouth daily. Take first 2 tablets together, then 1 every day until finished. 02/08/17   Neng Albee C, PA-C  benzonatate (TESSALON) 100 MG capsule Take 1 capsule (100 mg total) by mouth every 8 (eight) hours. 08/18/16   Felicie Morn, NP  metroNIDAZOLE (FLAGYL) 500 MG tablet Take 1 tablet (500 mg total) by mouth 2 (two) times daily. 11/03/16   Levie Heritage, DO  predniSONE (DELTASONE) 20 MG tablet 3 tabs po day one, then 2 tabs daily x 4 days 08/18/16   Felicie Morn, NP    Family History Family History  Problem Relation Age of Onset  . Asthma Daughter   . Hypertension Mother   . Thyroid disease Mother   . Hypertension Maternal Aunt   . Hypertension Maternal Grandmother   . Thyroid disease Maternal Grandmother   . Heart disease Father   . Seizures Father     Social History Social History  Substance Use Topics  . Smoking status: Current Some Day Smoker    Packs/day: 0.50    Types: Cigarettes  . Smokeless tobacco: Never Used  . Alcohol use Yes     Comment: occasionally until pregnancy     Allergies   Latex   Review of Systems Review of Systems  Constitutional: Positive for chills and fever.  Respiratory: Positive for cough. Negative for shortness of breath.   Cardiovascular: Negative for chest pain.  Gastrointestinal: Negative for abdominal pain, diarrhea, nausea and vomiting.  Neurological: Negative for light-headedness and headaches.  All other systems reviewed  and are negative.    Physical Exam Updated Vital Signs BP 120/86 (BP Location: Left Arm)   Pulse (!) 112   Temp 99.5 F (37.5 C) (Oral)   Resp 20   Ht 5\' 4"  (1.626 m)   Wt 81.6 kg (180 lb)   LMP 02/07/2017   SpO2 94%   BMI 30.90 kg/m   Physical Exam  Constitutional: She appears well-developed and well-nourished. No distress.  HENT:  Head: Normocephalic and atraumatic.  Eyes: Conjunctivae are normal.  Neck: Neck supple.  Cardiovascular: Normal rate, regular rhythm, normal heart sounds and intact distal pulses.   No tachycardia noted on my exam.  Pulmonary/Chest: Effort normal. She has wheezes.  Wheezing worse in left middle and upper.  No increased work of breathing.  Patient speaks in full sentences without  difficulty.  Abdominal: Soft. There is no tenderness. There is no guarding.  Musculoskeletal: She exhibits no edema.  Lymphadenopathy:    She has no cervical adenopathy.  Neurological: She is alert.  Skin: Skin is warm and dry. Capillary refill takes less than 2 seconds. She is not diaphoretic.  Psychiatric: She has a normal mood and affect. Her behavior is normal.  Nursing note and vitals reviewed.    ED Treatments / Results  Labs (all labs ordered are listed, but only abnormal results are displayed) Labs Reviewed - No data to display  EKG  EKG Interpretation None       Radiology Dg Chest 2 View  Result Date: 02/08/2017 CLINICAL DATA:  Productive cough, body aches, and fever for the past 3 days. History of asthma. EXAM: CHEST  2 VIEW COMPARISON:  Chest x-ray of Aug 18, 2016 FINDINGS: The lungs are adequately inflated. There is hazy increased density in the lingula. The right lung is clear. The heart and pulmonary vascularity are normal. There is no pleural effusion. The bony thorax exhibits no acute abnormality. IMPRESSION: Patchy density in the lingula may reflect developing pneumonia or atelectasis. No acute abnormality elsewhere. Follow-up radiographs are recommended if the patient's symptoms do not resolve with anticipated therapy. Electronically Signed   By: David  SwazilandJordan M.D.   On: 02/08/2017 09:58    Procedures Procedures (including critical care time)  Medications Ordered in ED Medications  albuterol (PROVENTIL) (2.5 MG/3ML) 0.083% nebulizer solution (not administered)  acetaminophen (TYLENOL) 325 MG tablet (not administered)  acetaminophen (TYLENOL) tablet 650 mg (650 mg Oral Given 02/08/17 0854)  albuterol (PROVENTIL) (2.5 MG/3ML) 0.083% nebulizer solution 5 mg (5 mg Nebulization Given 02/08/17 0854)  ipratropium-albuterol (DUONEB) 0.5-2.5 (3) MG/3ML nebulizer solution 3 mL (3 mLs Nebulization Given 02/08/17 1112)  albuterol (PROVENTIL HFA;VENTOLIN HFA) 108 (90  Base) MCG/ACT inhaler 2 puff (2 puffs Inhalation Given 02/08/17 1113)     Initial Impression / Assessment and Plan / ED Course  I have reviewed the triage vital signs and the nursing notes.  Pertinent labs & imaging results that were available during my care of the patient were reviewed by me and considered in my medical decision making (see chart for details).  Clinical Course as of Feb 08 1121  Mon Feb 08, 2017  [id number] Patient yet in the room.  [SJ]    Clinical Course User Index [SJ] Bellina Tokarczyk C, PA-C    Patient presents with a cough.  Evidence of lingular pneumonia with exam findings that clinically correlate with this imaging finding.  Antibiotic therapy initiated. The patient was given instructions for home care as well as return precautions. Patient voices understanding of these  instructions, accepts the plan, and is comfortable with discharge.  Vitals:   02/08/17 0849 02/08/17 1119  BP: 120/86 113/77  Pulse: (!) 112 94  Resp: 20 18  Temp: 99.5 F (37.5 C)   TempSrc: Oral   SpO2: 94% 100%  Weight: 81.6 kg (180 lb)   Height: 5\' 4"  (1.626 m)      Final Clinical Impressions(s) / ED Diagnoses   Final diagnoses:  Lingular pneumonia    New Prescriptions New Prescriptions   ALBUTEROL (PROVENTIL HFA;VENTOLIN HFA) 108 (90 BASE) MCG/ACT INHALER    Inhale 2 puffs into the lungs every 4 (four) hours as needed for wheezing or shortness of breath.   AZITHROMYCIN (ZITHROMAX) 250 MG TABLET    Take 1 tablet (250 mg total) by mouth daily. Take first 2 tablets together, then 1 every day until finished.     Anselm Pancoast, PA-C 02/08/17 1123    Mesner, Barbara Cower, MD 02/08/17 1610

## 2017-02-08 NOTE — ED Notes (Signed)
Patient was in triage receiving breathing treatment.  X ray notified that patient is ready for x ray now.

## 2017-02-08 NOTE — Discharge Instructions (Signed)
There is evidence of pneumonia on the chest x-ray.  Other treatment is symptomatic care.  Please take all of your antibiotics until finished!   You may develop abdominal discomfort or diarrhea from the antibiotic.  You may help offset this with probiotics which you can buy or get in yogurt. Do not eat or take the probiotics until 2 hours after your antibiotic.   Hand washing: Wash your hands throughout the day, but especially before and after touching the face, using the restroom, sneezing, coughing, or touching surfaces that have been coughed or sneezed upon. Hydration: Symptoms will be intensified and complicated by dehydration. Dehydration can also extend the duration of symptoms. Drink plenty of fluids and get plenty of rest. You should be drinking at least half a liter of water an hour to stay hydrated. Electrolyte drinks are also encouraged. You should be drinking enough fluids to make your urine light yellow, almost clear. If this is not the case, you are not drinking enough water. Please note that some of the treatments indicated below will not be effective if you are not adequately hydrated. Pain or fever: Ibuprofen, Naproxen, or Tylenol for pain or fever.  Congestion: Plain Mucinex may help relieve congestion. Saline sinus rinses and saline nasal sprays may also help relieve congestion. If you do not have heart problems or an allergy to such medications, you may also try phenylephrine or Sudafed. Sore throat: Warm liquids or Chloraseptic spray may help soothe a sore throat. Gargle twice a day with a salt water solution made from a half teaspoon of salt in a cup of warm water.  Follow up: Follow up with a primary care provider, as needed, for any future management of this issue. Return: Return to the ED for worsening symptoms or for shortness of breath, persistent fever of over 100.33F despite Tylenol or ibuprofen, persistent vomiting, or any other major concerns.

## 2017-02-08 NOTE — ED Triage Notes (Signed)
PerPt, Pt is coming from home with complaints of productive cough, body aches, and fever x 3 days. Son has the same symptoms. Hx of asthma and wheezing noted.

## 2017-08-05 ENCOUNTER — Encounter: Payer: Self-pay | Admitting: *Deleted

## 2017-11-06 ENCOUNTER — Encounter (HOSPITAL_COMMUNITY): Payer: Self-pay | Admitting: Emergency Medicine

## 2017-11-06 ENCOUNTER — Emergency Department (HOSPITAL_COMMUNITY)
Admission: EM | Admit: 2017-11-06 | Discharge: 2017-11-06 | Disposition: A | Discharge status: Home / Self Care | Payer: Medicaid Other | Attending: Emergency Medicine | Admitting: Emergency Medicine

## 2017-11-06 DIAGNOSIS — H109 Unspecified conjunctivitis: Secondary | ICD-10-CM | POA: Diagnosis not present

## 2017-11-06 DIAGNOSIS — H1033 Unspecified acute conjunctivitis, bilateral: Secondary | ICD-10-CM

## 2017-11-06 DIAGNOSIS — H10029 Other mucopurulent conjunctivitis, unspecified eye: Secondary | ICD-10-CM | POA: Diagnosis present

## 2017-11-06 DIAGNOSIS — F17210 Nicotine dependence, cigarettes, uncomplicated: Secondary | ICD-10-CM | POA: Diagnosis not present

## 2017-11-06 MED ORDER — OFLOXACIN 0.3 % OP SOLN
1.0000 [drp] | Freq: Four times a day (QID) | OPHTHALMIC | Status: DC
  Administered 2017-11-06 (×2): 1 [drp] via OPHTHALMIC
  Filled 2017-11-06: qty 5

## 2017-11-06 NOTE — ED Provider Notes (Signed)
[facility] EMERGENCY DEPARTMENT Provider Note   CSN: [id number] Arrival date & time: 11/06/17  0003     History   Chief Complaint Chief Complaint  Patient presents with  . Eye Drainage    HPI Jordan Hall is a 32 y.o. female.  Patient woke this morning with burning type pain, redness and swelling of left eye with persistent drainage throughout the day. The drainage has been clear. Symptoms started in the right eye later in the day. No visual changes, pain with eye movement or matting. She denies any contact with known allergens. No history of allergies. No current sneezing or itching of the eyes.   The history is provided by the patient. No language interpreter was used.    Past Medical History:  Diagnosis Date  . Abnormal Pap smear   . Asthma   . Hx of chlamydia infection     Patient Active Problem List   Diagnosis Date Noted  . Encounter for insertion of ParaGard IUD 07/30/2016    Past Surgical History:  Procedure Laterality Date  . COLPOSCOPY       OB History    Gravida  7   Para  4   Term  3   Preterm  1   AB  3   Living  3     SAB  2   TAB  0   Ectopic  0   Multiple  0   Live Births  3            Home Medications    Prior to Admission medications   Medication Sig Start Date End Date Taking? Authorizing Provider  albuterol (PROVENTIL HFA;VENTOLIN HFA) 108 (90 Base) MCG/ACT inhaler Inhale 1-2 puffs into the lungs every 6 (six) hours as needed for wheezing or shortness of breath. 08/18/16   Felicie MornSmith, David, NP  albuterol (PROVENTIL HFA;VENTOLIN HFA) 108 (90 Base) MCG/ACT inhaler Inhale 2 puffs into the lungs every 4 (four) hours as needed for wheezing or shortness of breath. 02/08/17   Joy, Hillard DankerShawn C, PA-C  aspirin EC 81 MG tablet Take 81 mg by mouth daily.    [provider]  azithromycin (ZITHROMAX) 250 MG tablet Take 1 tablet (250 mg total) by mouth daily. Take first 2 tablets together, then 1 every day until  finished. 02/08/17   Joy, Shawn C, PA-C  benzonatate (TESSALON) 100 MG capsule Take 1 capsule (100 mg total) by mouth every 8 (eight) hours. 08/18/16   Felicie MornSmith, David, NP  metroNIDAZOLE (FLAGYL) 500 MG tablet Take 1 tablet (500 mg total) by mouth 2 (two) times daily. 11/03/16   Levie HeritageStinson, Jacob J, DO  predniSONE (DELTASONE) 20 MG tablet 3 tabs po day one, then 2 tabs daily x 4 days 08/18/16   Felicie MornSmith, David, NP    Family History Family History  Problem Relation Age of Onset  . Asthma Daughter   . Hypertension Mother   . Thyroid disease Mother   . Hypertension Maternal Aunt   . Hypertension Maternal Grandmother   . Thyroid disease Maternal Grandmother   . Heart disease Father   . Seizures Father     Social History Social History   Tobacco Use  . Smoking status: Current Some Day Smoker    Packs/day: 0.50    Types: Cigarettes  . Smokeless tobacco: Never Used  Substance Use Topics  . Alcohol use: Yes    Comment: occasionally until pregnancy  . Drug use: No     Allergies  Latex   Review of Systems Review of Systems  Constitutional: Negative for fever.  HENT: Positive for facial swelling. Negative for congestion, rhinorrhea and sneezing.   Eyes: Positive for pain, discharge and redness. Negative for photophobia, itching and visual disturbance.  Gastrointestinal: Negative for nausea.     Physical Exam Updated Vital Signs BP (!) 146/102 (BP Location: Right Arm)   Pulse 75   Temp 98.1 F (36.7 C) (Oral)   Resp 14   Ht 5\' 4"  (1.626 m)   Wt 83.5 kg (184 lb)   SpO2 96%   BMI 31.58 kg/m   Physical Exam  Constitutional: She is oriented to person, place, and time. She appears well-developed and well-nourished.  HENT:  Rhinorrhea.  Eyes:  L>R conjunctival swelling and redness. No chemosis. There is clear drainage bilaterally. Lids are slightly edematous bilaterally. FROM without pain. No hyphema.   Neck: Normal range of motion.  Pulmonary/Chest: Effort normal.  Neurological:  She is alert and oriented to person, place, and time.  Skin: Skin is warm and dry.     ED Treatments / Results  Labs (all labs ordered are listed, but only abnormal results are displayed) Labs Reviewed - No data to display  EKG None  Radiology No results found.  Procedures Procedures (including critical care time)  Medications Ordered in ED Medications - No data to display   Initial Impression / Assessment and Plan / ED Course  I have reviewed the triage vital signs and the nursing notes.  Pertinent labs & imaging results that were available during my care of the patient were reviewed by me and considered in my medical decision making (see chart for details).     Patient with symptoms of eye redness, burning and clear drainage x 1 day with exam c/w conjunctivitis. Feel condition is likely allergic without purulent drainage. Will cover with abx, as well as recommend Claritin or Zyrtec daily. Ophtho referral provided.  Final Clinical Impressions(s) / ED Diagnoses   Final diagnoses:  None   1. Conjunctivitis  ED Discharge Orders    None       Danne Harbor 11/06/17 0107    Dione Booze, MD 11/06/17 [phone]

## 2017-11-06 NOTE — ED Triage Notes (Signed)
Pt states she thinks she has pink eye. Woke up yesterday AM with L eye burning and drainage.

## 2017-11-06 NOTE — Discharge Instructions (Addendum)
Use the eye drops 4 times daily for the next 5 days. I would also recommend daily use of Zyrtec or Claritin for any allergic component to your symptoms, which based on exam is likely. If symptoms worsen, follow up with Dr. Tawny AsalBottorff (eye doctor) for recheck and further treatment.

## 2018-05-13 ENCOUNTER — Encounter (HOSPITAL_COMMUNITY): Payer: Self-pay | Admitting: Emergency Medicine

## 2018-05-13 ENCOUNTER — Emergency Department (HOSPITAL_COMMUNITY)
Admission: EM | Admit: 2018-05-13 | Discharge: 2018-05-14 | Disposition: A | Discharge status: Home / Self Care | Payer: Medicaid Other | Attending: Emergency Medicine | Admitting: Emergency Medicine

## 2018-05-13 ENCOUNTER — Other Ambulatory Visit: Payer: Self-pay

## 2018-05-13 DIAGNOSIS — T7621XA Adult sexual abuse, suspected, initial encounter: Secondary | ICD-10-CM | POA: Diagnosis not present

## 2018-05-13 DIAGNOSIS — Z202 Contact with and (suspected) exposure to infections with a predominantly sexual mode of transmission: Secondary | ICD-10-CM | POA: Diagnosis not present

## 2018-05-13 DIAGNOSIS — Z91040 Latex allergy status: Secondary | ICD-10-CM | POA: Diagnosis not present

## 2018-05-13 DIAGNOSIS — Z7982 Long term (current) use of aspirin: Secondary | ICD-10-CM | POA: Insufficient documentation

## 2018-05-13 DIAGNOSIS — F17210 Nicotine dependence, cigarettes, uncomplicated: Secondary | ICD-10-CM | POA: Insufficient documentation

## 2018-05-13 DIAGNOSIS — J45909 Unspecified asthma, uncomplicated: Secondary | ICD-10-CM | POA: Insufficient documentation

## 2018-05-13 DIAGNOSIS — Z79899 Other long term (current) drug therapy: Secondary | ICD-10-CM | POA: Diagnosis not present

## 2018-05-13 LAB — COMPREHENSIVE METABOLIC PANEL
ALBUMIN: 4.2 g/dL (ref 3.5–5.0)
ALT: 38 U/L (ref 0–44)
AST: 29 U/L (ref 15–41)
Alkaline Phosphatase: 45 U/L (ref 38–126)
Anion gap: 11 (ref 5–15)
BUN: 10 mg/dL (ref 6–20)
CHLORIDE: 108 mmol/L (ref 98–111)
CO2: 24 mmol/L (ref 22–32)
Calcium: 9 mg/dL (ref 8.9–10.3)
Creatinine, Ser: 0.73 mg/dL (ref 0.44–1.00)
GFR calc Af Amer: >60 mL/min (ref >60)
GFR calc non Af Amer: >60 mL/min (ref >60)
Glucose, Bld: 89 mg/dL (ref 70–99)
Potassium: 3.6 mmol/L (ref 3.5–5.1)
Sodium: 143 mmol/L (ref 135–145)
Total Bilirubin: 0.3 mg/dL (ref 0.3–1.2)
Total Protein: 7.9 g/dL (ref 6.5–8.1)

## 2018-05-13 LAB — CBC WITH DIFFERENTIAL/PLATELET
ABS IMMATURE GRANULOCYTES: 0.02 10*3/uL (ref 0.00–0.07)
BASOS ABS: 0 10*3/uL (ref 0.0–0.1)
Basophils Relative: 0 %
Eosinophils Absolute: 0.2 10*3/uL (ref 0.0–0.5)
Eosinophils Relative: 2 %
HEMATOCRIT: 45.7 % (ref 36.0–46.0)
HEMOGLOBIN: 15.3 g/dL — AB (ref 12.0–15.0)
Immature Granulocytes: 0 %
LYMPHS ABS: 4.6 10*3/uL — AB (ref 0.7–4.0)
LYMPHS PCT: 47 %
MCH: 30.7 pg (ref 26.0–34.0)
MCHC: 33.5 g/dL (ref 30.0–36.0)
MCV: 91.8 fL (ref 80.0–100.0)
Monocytes Absolute: 0.7 10*3/uL (ref 0.1–1.0)
Monocytes Relative: 7 %
NEUTROS ABS: 4.3 10*3/uL (ref 1.7–7.7)
NEUTROS PCT: 44 %
NRBC: 0 % (ref 0.0–0.2)
Platelets: 338 10*3/uL (ref 150–400)
RBC: 4.98 MIL/uL (ref 3.87–5.11)
RDW: 12.8 % (ref 11.5–15.5)
WBC: 9.8 10*3/uL (ref 4.0–10.5)

## 2018-05-13 LAB — HCG, SERUM, QUALITATIVE: Preg, Serum: NEGATIVE

## 2018-05-13 MED ORDER — ACETAMINOPHEN 325 MG PO TABS
650.0000 mg | ORAL_TABLET | Freq: Once | ORAL | Status: AC
  Administered 2018-05-13: 650 mg via ORAL
  Filled 2018-05-13: qty 2

## 2018-05-13 NOTE — ED Notes (Signed)
Sane nurse will be here within an hour
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
## 2018-05-13 NOTE — ED Notes (Signed)
Pt has returned to room.

## 2018-05-13 NOTE — ED Notes (Signed)
Pt changed back into her clothes to go to lobby to see her son. Pt stated she was leaving if her son could not come back here. This RN informed pt of hospital flu  Restriction policy and pt decided to leave.

## 2018-05-13 NOTE — ED Notes (Signed)
Per [employer], pt states she wants to come back and see SANE RN, She will see her son in lobby and then come back to room to see RN

## 2018-05-14 LAB — RPR: RPR Ser Ql: NONREACTIVE

## 2018-05-14 LAB — RAPID HIV SCREEN (HIV 1/2 AB+AG)
HIV 1/2 Antibodies: NONREACTIVE
HIV-1 P24 Antigen - HIV24: NONREACTIVE

## 2018-05-14 MED ORDER — ELVITEG-COBIC-EMTRICIT-TENOFAF 150-150-200-10 MG PO TABS: 1.0000 | ORAL_TABLET | Freq: Every day | ORAL | 0 refills | Status: AC

## 2018-05-14 MED ORDER — ELVITEG-COBIC-EMTRICIT-TENOFAF 150-150-200-10 MG PREPACK
5.0000 | ORAL_TABLET | Freq: Once | ORAL | Status: AC
  Administered 2018-05-14: 5 via ORAL
  Filled 2018-05-14: qty 5

## 2018-05-14 MED ORDER — LIDOCAINE HCL (PF) 1 % IJ SOLN
INTRAMUSCULAR | Status: AC
  Filled 2018-05-14: qty 5

## 2018-05-14 MED ORDER — METRONIDAZOLE 500 MG PO TABS: 2000.0000 mg | ORAL_TABLET | Freq: Once | ORAL | 0 refills | Status: AC

## 2018-05-14 MED ORDER — ULIPRISTAL ACETATE 30 MG PO TABS: 30.0000 mg | ORAL_TABLET | Freq: Once | ORAL | Status: DC

## 2018-05-14 MED ORDER — ELVITEG-COBIC-EMTRICIT-TENOFAF 150-150-200-10 MG PREPACK
ORAL_TABLET | ORAL | Status: AC
  Filled 2018-05-14: qty 1

## 2018-05-14 MED ORDER — CEFTRIAXONE SODIUM 250 MG IJ SOLR
250.0000 mg | Freq: Once | INTRAMUSCULAR | Status: AC
  Administered 2018-05-14: 250 mg via INTRAMUSCULAR
  Filled 2018-05-14: qty 250

## 2018-05-14 MED ORDER — AZITHROMYCIN 250 MG PO TABS
1000.0000 mg | ORAL_TABLET | Freq: Once | ORAL | Status: AC
  Administered 2018-05-14: 1000 mg via ORAL
  Filled 2018-05-14: qty 4
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
## 2018-05-14 NOTE — ED Notes (Signed)
SANE RN at bedside.
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
## 2018-05-16 LAB — HEPATITIS PANEL, ACUTE
HCV Ab: <0.1 s/co ratio (ref 0.0–0.9)
Hep A IgM: NEGATIVE
Hep B C IgM: NEGATIVE
Hepatitis B Surface Ag: NEGATIVE

## 2018-08-03 IMAGING — US US MFM FETAL NUCHAL TRANSLUCENCY
1 series · 15 of 28 positions shown · non-contrast
Comparison: none

[Series 1: us mfm fetal nuchal translucency · 35 acquisitions, 15 frames shown]
[im 1/35]
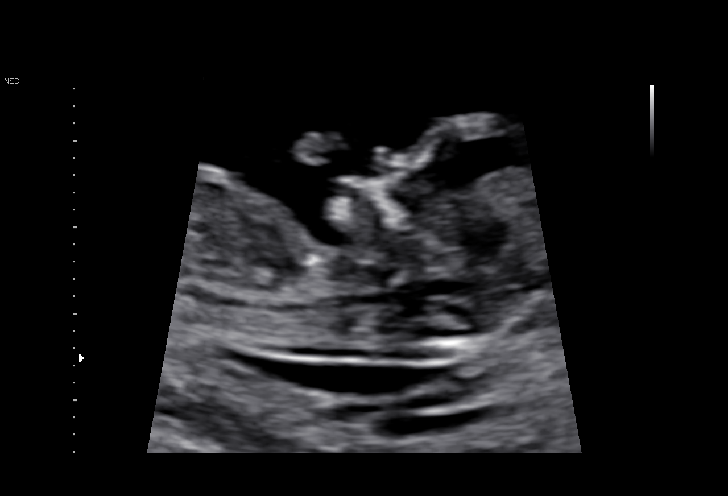
[im 3/35]
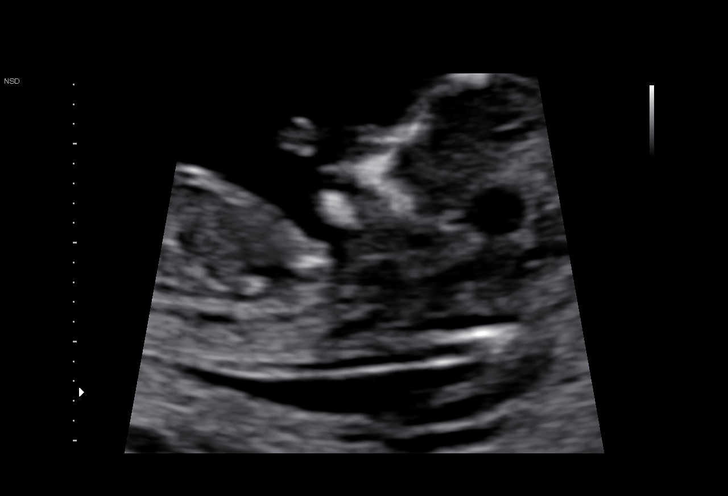
[im 6/35]
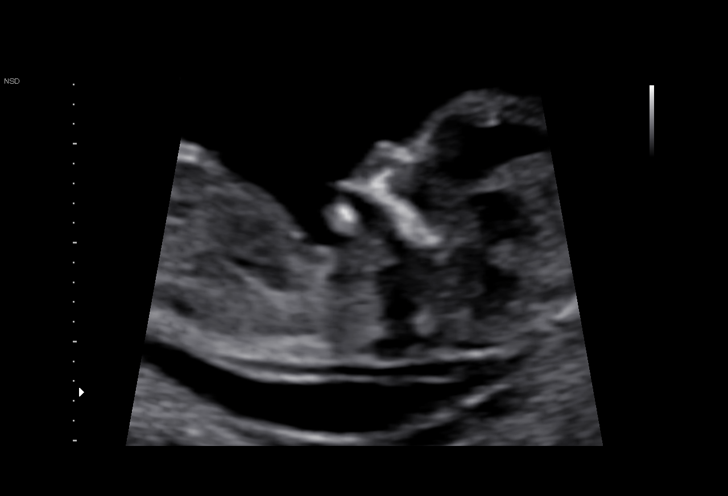
[im 8/35]
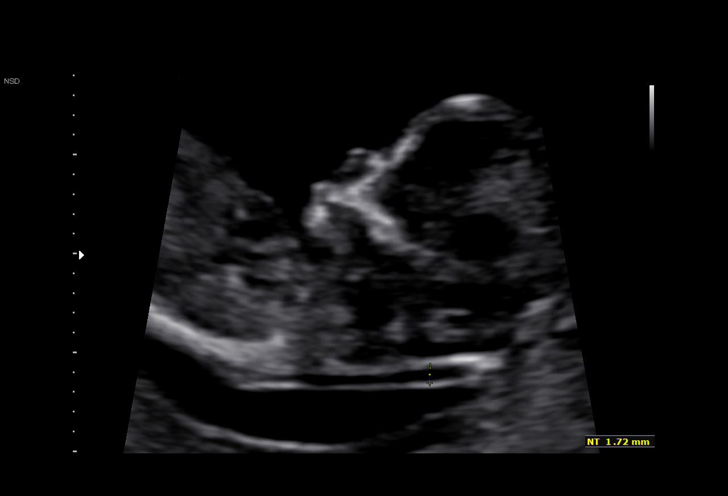
[im 11/35]
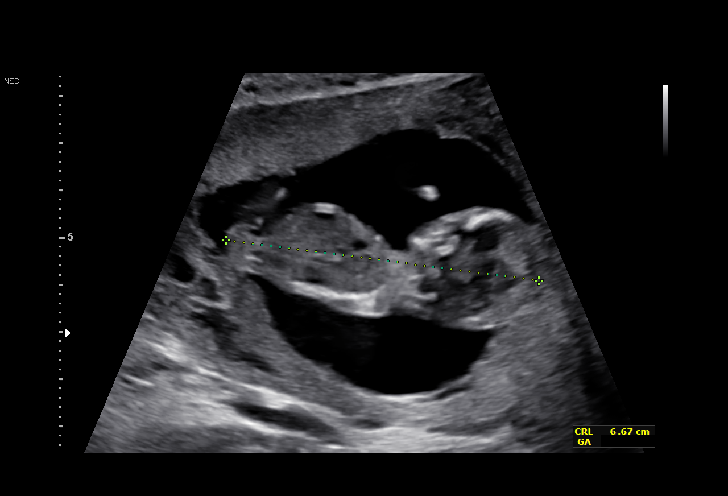
[im 13/35]
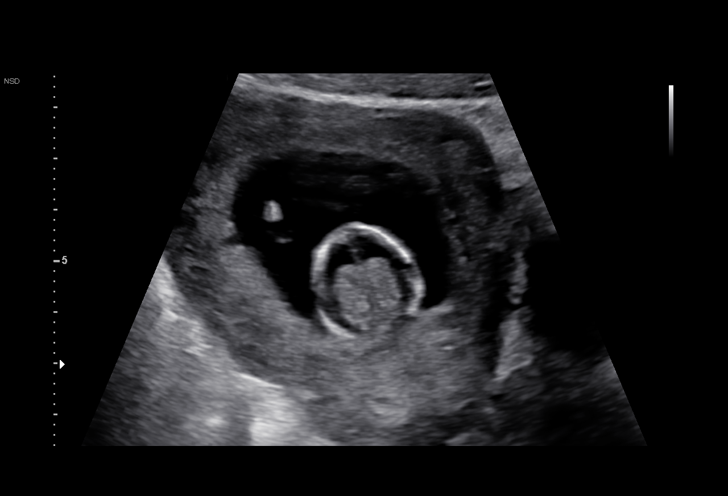
[im 16/35]
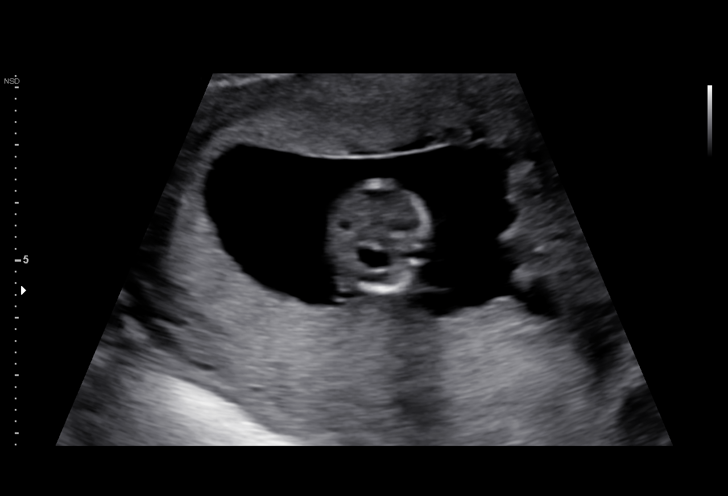
[im 18/35]
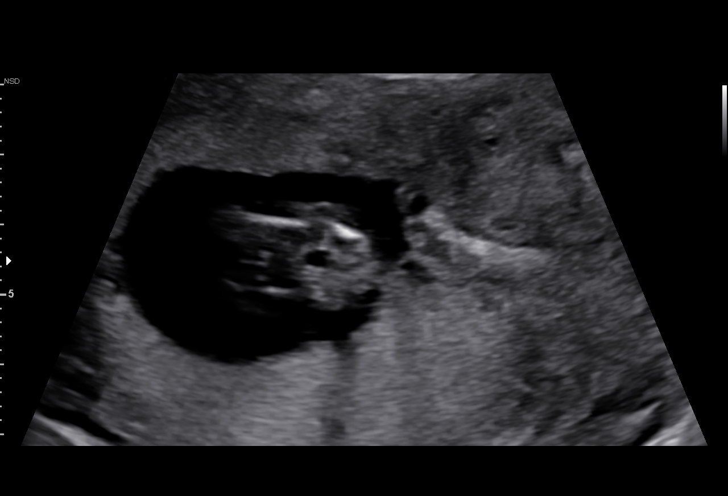
[im 19/35]
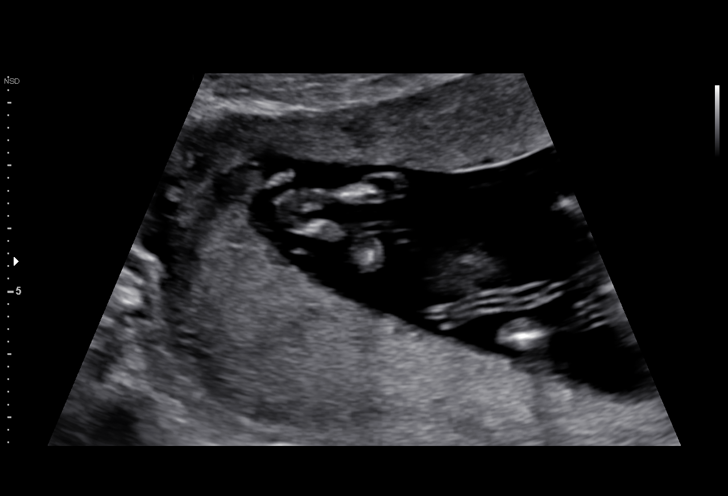
[im 22/35]
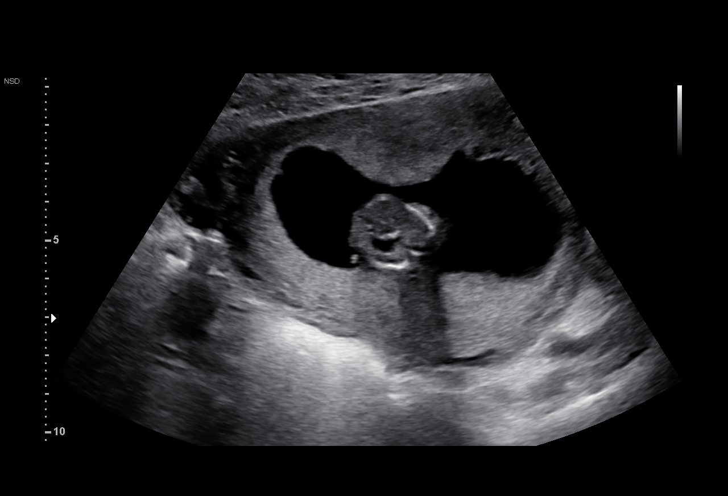
[im 24/35]
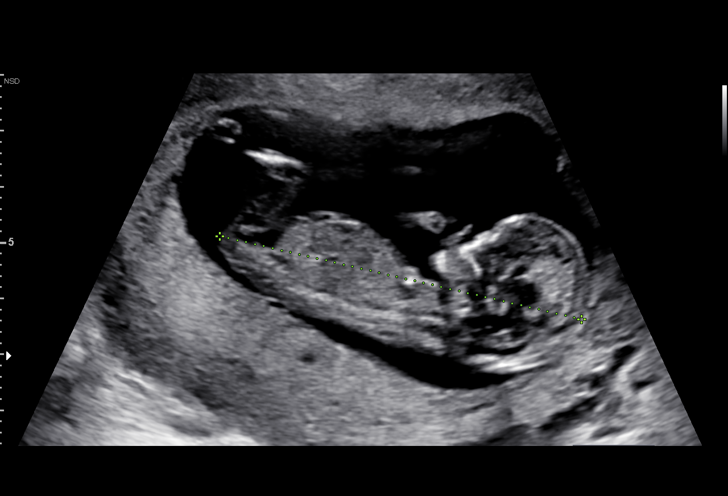
[im 27/35]
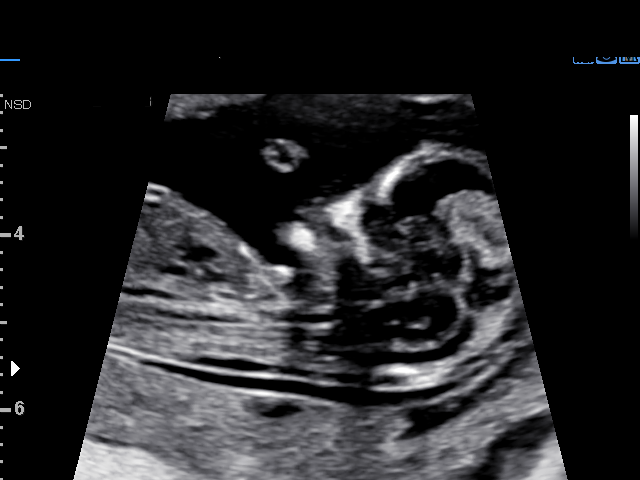
[im 29/35]
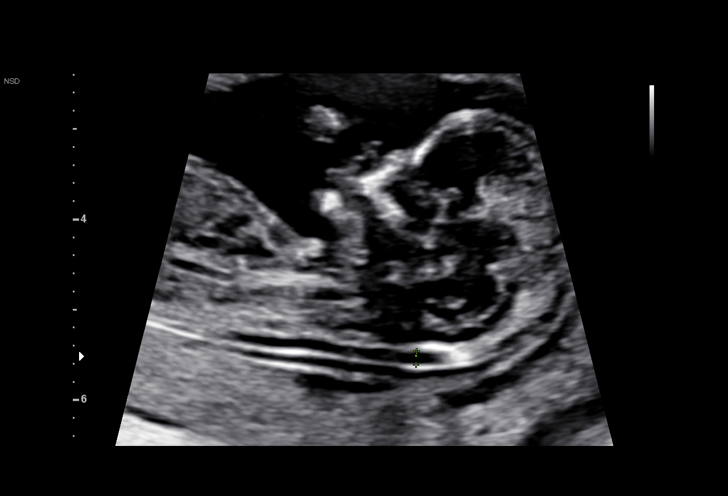
[im 32/35]
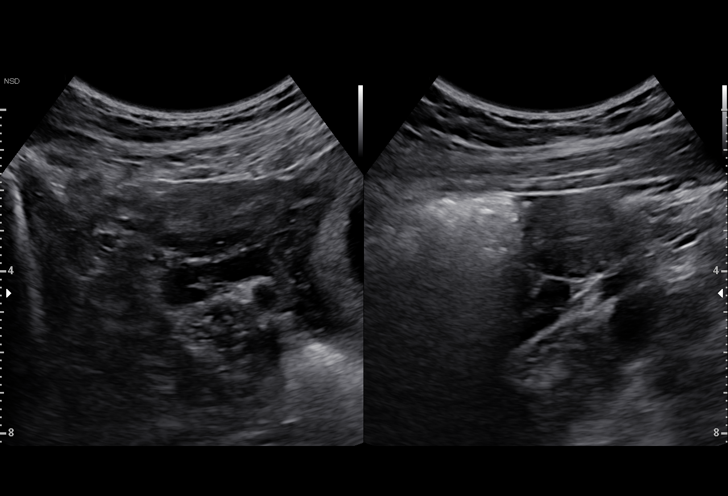
[im 35/35]
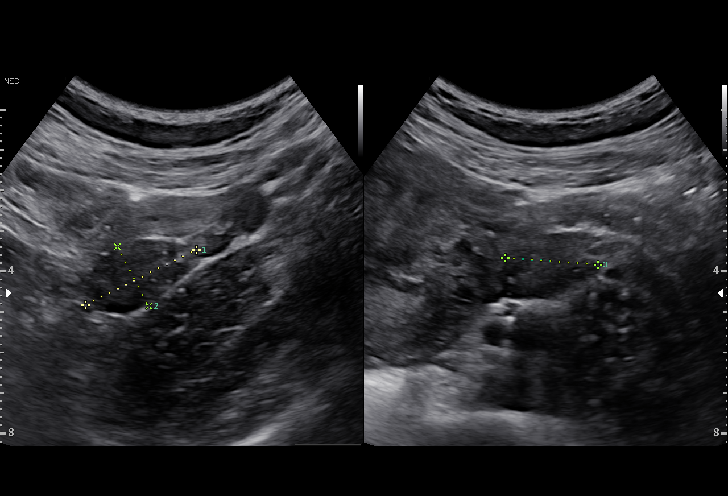

[15 of 28 positions shown; findings below may reference images not displayed]

OB/Gyn Clinic
[REDACTED]

TRANSLUCENCY

1  GINIKACHI SON            [id number]      [id number]     [id number]
Indications

12 weeks gestation of pregnancy
First trimester aneuploidy screen (NT)         Z36
Previous cervical surgery (Colposcopy)
Poor obstetric history: Previous IUFD (37
weeks)
Poor obstetric history: Prior fetal
macrosomia, antepartum (39.2 weeks
lbs)
Asthma                                         2CC.0C j46.767
OB History

Gravidity:    7         Term:   2        Prem:   1         SAB:   2
TOP:          0       Ectopic:  0        Living: 2
Fetal Evaluation

Num Of Fetuses:     1
Fetal Heart         164
Rate(bpm):
Cardiac Activity:   Observed
Presentation:       Cephalic
Placenta:           Posterior
Amniotic Fluid
AFI FV:      Subjectively within normal limits

Largest Pocket(cm)
3.4
Biometry

CRL:      65.6  mm     G. Age:  12w 5d                   EDD:   07/04/16
Gestational Age

LMP:           16w 3d        Date:  09/02/15                 EDD:    06/08/16
Best:          12w 3d     Det. By:  Early Ultrasound         EDD:    07/06/16
(11/18/15)
1st Trimester Genetic Sonogram Screening

CRL:            65.6  mm    G. Age:   12w 5d                 EDD:    07/04/16
Nuc Trans:       1.6  mm
Nasal Bone:                 Present
Anatomy

Choroid Plexus:        Appears normal         Bladder:                Appears normal
Stomach:               Appears normal, left   Upper Extremities:      Present
sided
Abdominal Wall:        Appears nml (cord      Lower Extremities:      Present
insert, abd wall)
Cervix Uterus Adnexa

Cervix
Normal appearance by transabdominal scan.

Left Ovary
Within normal limits.

Right Ovary
Within normal limits.

Adnexa:       No abnormality visualized.
Impression

SIUP at 12+3 weeks
No gross abnormalities identified
NT measurement was within normal limits for this GA; NB
present
Normal amniotic fluid volume
Measurements consistent with prior US
Recommendations

Offer MSAFP in the second trimester for ONTD screening
Offer anatomy U/S by 18 weeks

## 2018-11-06 IMAGING — US US MFM OB FOLLOW-UP
1 series · 12 of 28 positions shown · non-contrast
Comparison: none

[Series 1: us mfm ob follow-up · 12 of 66 slices shown]
[im 3/66]
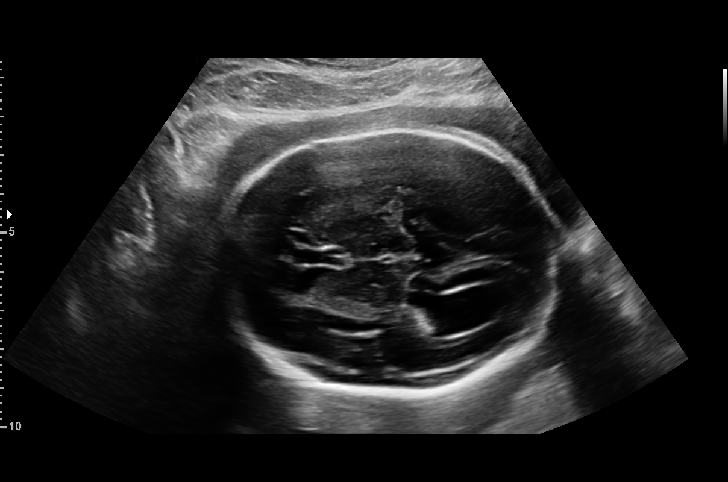
[im 8/66]
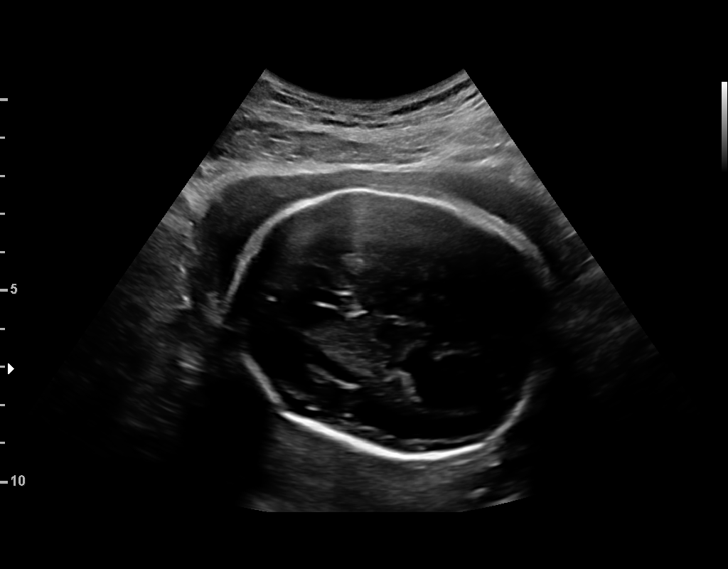
[im 13/66]
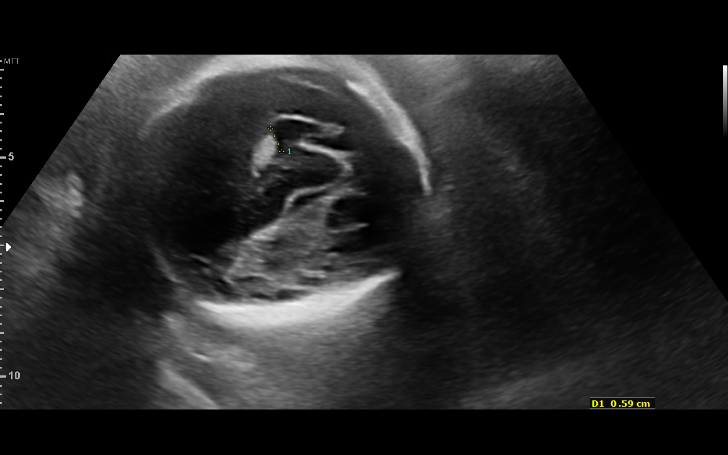
[im 20/66]
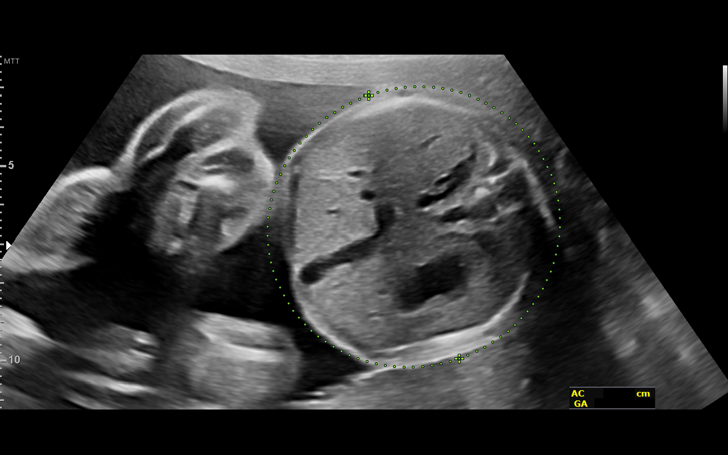
[im 25/66]
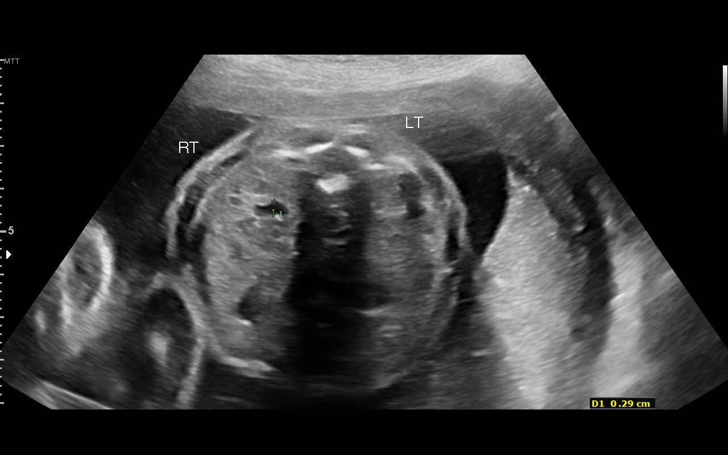
[im 29/66]
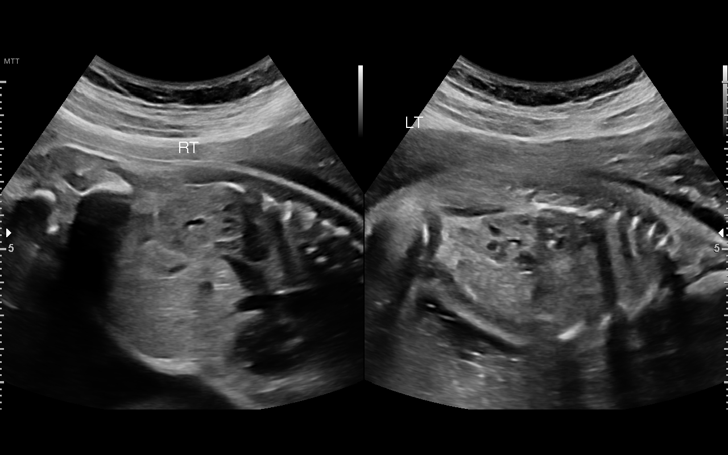
[im 37/66]
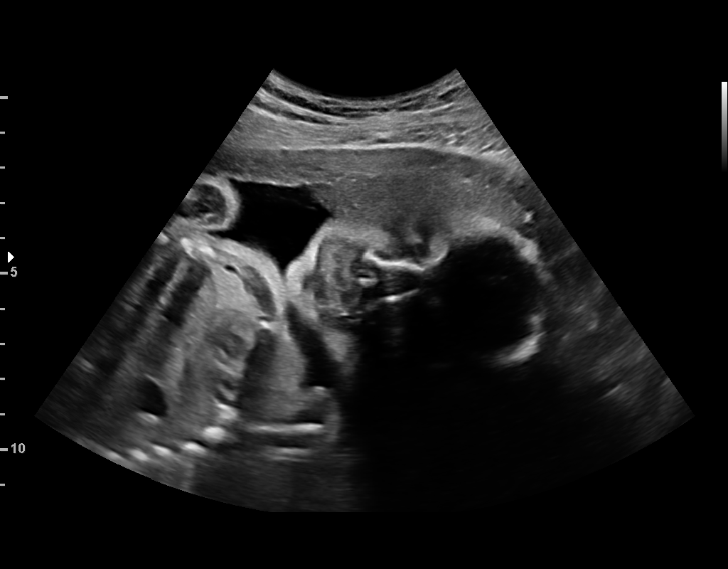
[im 41/66]
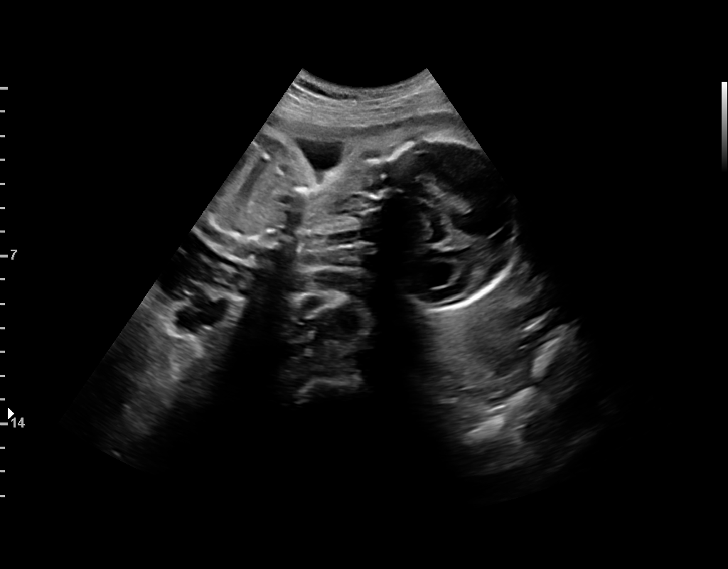
[im 46/66]
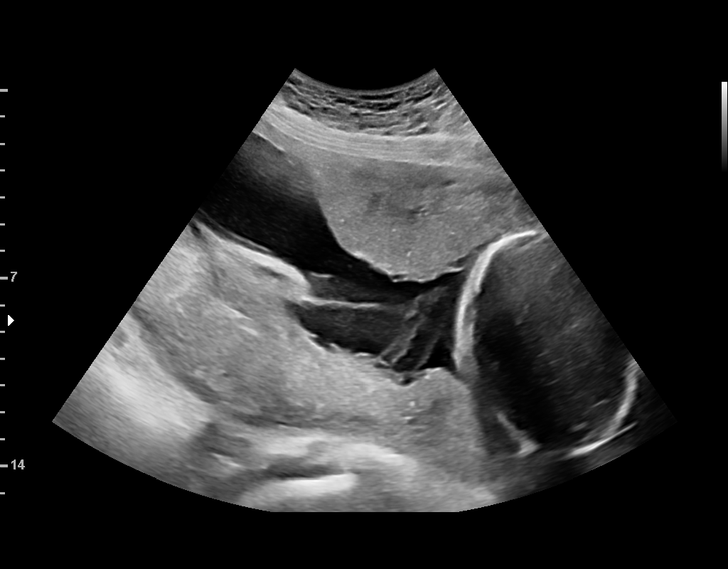
[im 53/66]
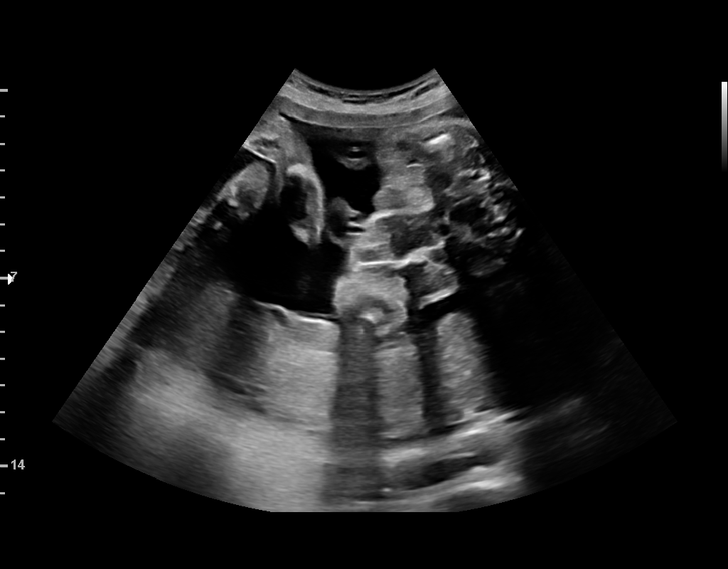
[im 58/66]
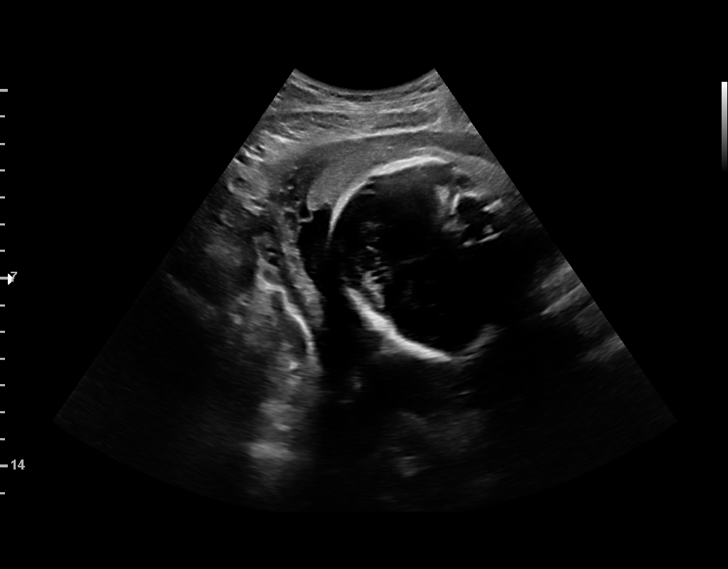
[im 63/66]
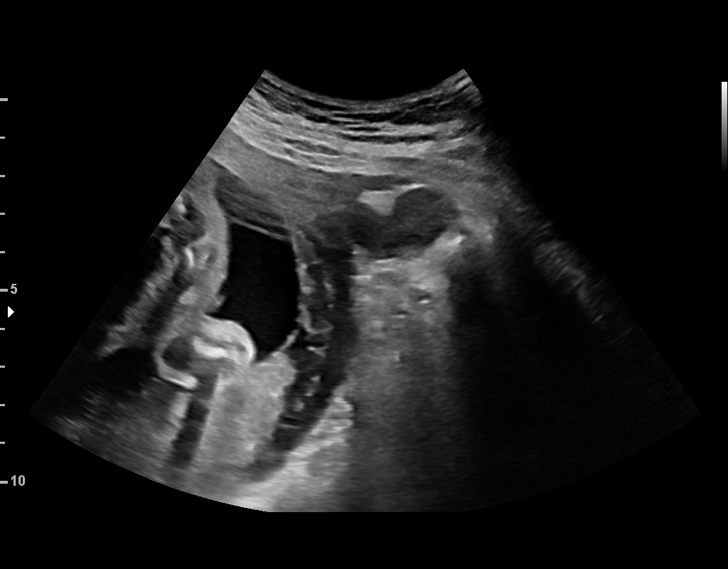

[12 of 28 positions shown; findings below may reference images not displayed]

OB/Gyn Clinic
Wilmer DO

Indications

26 weeks gestation of pregnancy
Poor obstetric history: Previous IUFD
(stillbirth)
Poor obstetric history: Prior fetal
macrosomia, antepartum
Maternal alcohol use complicating
pregnancy, antepartum
Smoking complicating pregnancy, second
trimester
OB History

Gravidity:    7         Term:   2        Prem:   1        SAB:   2
TOP:          0       Ectopic:  0        Living: 2
Fetal Evaluation

Num Of Fetuses:     1
Fetal Heart         146
Rate(bpm):
Cardiac Activity:   Observed
Presentation:       Cephalic
Placenta:           Right lateral, above cervical os
P. Cord Insertion:  Marginal insertion previously
Amniotic Fluid
AFI FV:      Subjectively within normal limits

Largest Pocket(cm)
7.5
Biometry

BPD:      66.1  mm     G. Age:  26w 4d         63  %    CI:        73.17   %   70 - 86
FL/HC:      19.3   %   18.6 -
HC:      245.6  mm     G. Age:  26w 5d         49  %    HC/AC:      1.07       1.04 -
AC:      229.4  mm     G. Age:  27w 2d         79  %    FL/BPD:     71.6   %   71 - 87
FL:       47.3  mm     G. Age:  25w 6d         30  %    FL/AC:      20.6   %   20 - 24
HUM:      42.9  mm     G. Age:  25w 5d         38  %
CER:      28.3  mm     G. Age:  25w 2d         40  %

CM:        7.4  mm
Est. FW:     969  gm      2 lb 2 oz     68  %
Gestational Age

LMP:           30w 0d       Date:   09/02/15                 EDD:   06/08/16
U/S Today:     26w 4d                                        EDD:   07/02/16
Best:          26w 0d    Det. By:   Early Ultrasound         EDD:   07/06/16
(11/18/15)
Anatomy

Cranium:               Appears normal         Aortic Arch:            Previously seen
Cavum:                 Previously seen        Ductal Arch:            Previously seen
Ventricles:            Ventriculomegaly,      Diaphragm:              Appears normal
LT atrium 11 m
Choroid Plexus:        Previously seen        Stomach:                Appears normal, left
sided
Cerebellum:            Appears normal         Abdomen:                Previously seen
Posterior Fossa:       Previously seen        Abdominal Wall:         Previously seen
Nuchal Fold:           Previously seen        Cord Vessels:           Previously seen
Face:                  Orbits and profile     Kidneys:                Appear normal
previously seen
Lips:                  Previously seen        Bladder:                Appears normal
Thoracic:              Previously seen        Spine:                  Previously seen
Heart:                 Previously seen        Upper Extremities:      Previously seen
RVOT:                  Previously seen        Lower Extremities:      Previously seen
LVOT:                  Previously seen

Other:  Male gender previously seen.  Heels and 5th digit previously seen.
Nasal bone previously seen.
Cervix Uterus Adnexa

Cervix
Length:           3.22  cm.
Normal appearance by transabdominal scan.

Uterus
No abnormality visualized.

Left Ovary
No adnexal mass visualized.
Right Ovary
No adnexal mass visualized.

Cul De Sac:   No free fluid seen.

Adnexa:       No abnormality visualized.
Impression

SIUP at 30w6d, hx IUFD
active singleton fetus
EFW 68th%'le
mild left ventriculomegaly, 10.9mm
low risk ABE
no previa
amniotic fluid volume is gestational age appropriate

Interval fetal anatomic survey reveals new onset of unilateral
(left) mild cerebral ventriculomegaly with the lateral
ventricular diameter measured 1.09cm:

In view of these findings, a survey of the remainder of the
fetal anatomy was performed and no other dysmorphic
features, or morphologic "soft markers" associated with
aneuploidy, were detected. Mild ventriculomegaly can be
seen in up to 1% of late second trimester pregnancies. Most
instances of "mild" ventriculomegaly (atrial width of 10-15
mm) eventuate in normal outcomes. This is particularly true
for male fetuses, in whom slightly increased lateral ventricular
width, compared to female fetuses, has been reported.

Ventriculomegaly can in some instances indicate underlying
cerebral mal-development or a destructive lesion: Most
importantly, there is a reported association between
apparently isolated ventriculomegaly and chromosomal
abnormalities (often trisomy 21) and infection, namely, CMV
and Toxoplasmosis.

The constellation of findings confers risk for mild to moderate
neuro-developmental delay. We discussed options for testing
including infection.  Previous risk assessment of low risk for
T21 remains low risk after today's finding.
Recommendations

mild ventriculomegaly:
reassess in 1 month with interval growth
Toxoplasma and CMV titers today

Hx IUFD:
monthly interval growth
begin antenatal testing at 32 weeks
ASA 81 mg po qd until 37 weeks
delivery at 
39 weeks

## 2018-11-17 ENCOUNTER — Encounter (HOSPITAL_COMMUNITY): Payer: Self-pay | Admitting: Emergency Medicine

## 2018-11-17 ENCOUNTER — Emergency Department (HOSPITAL_COMMUNITY)
Admission: EM | Admit: 2018-11-17 | Discharge: 2018-11-17 | Disposition: A | Discharge status: Home / Self Care | Payer: Medicaid Other | Attending: Emergency Medicine | Admitting: Emergency Medicine

## 2018-11-17 DIAGNOSIS — Z7982 Long term (current) use of aspirin: Secondary | ICD-10-CM | POA: Insufficient documentation

## 2018-11-17 DIAGNOSIS — N898 Other specified noninflammatory disorders of vagina: Secondary | ICD-10-CM

## 2018-11-17 DIAGNOSIS — N899 Noninflammatory disorder of vagina, unspecified: Secondary | ICD-10-CM | POA: Diagnosis not present

## 2018-11-17 DIAGNOSIS — Z79899 Other long term (current) drug therapy: Secondary | ICD-10-CM | POA: Diagnosis not present

## 2018-11-17 DIAGNOSIS — Z720 Tobacco use: Secondary | ICD-10-CM | POA: Diagnosis not present

## 2018-11-17 MED ORDER — CEFTRIAXONE SODIUM 250 MG IJ SOLR
250.0000 mg | Freq: Once | INTRAMUSCULAR | Status: AC
  Administered 2018-11-17: 250 mg via INTRAMUSCULAR
  Filled 2018-11-17: qty 250

## 2018-11-17 MED ORDER — AZITHROMYCIN 250 MG PO TABS
1000.0000 mg | ORAL_TABLET | Freq: Once | ORAL | Status: AC
  Administered 2018-11-17: 1000 mg via ORAL
  Filled 2018-11-17: qty 4

## 2018-11-17 MED ORDER — FLUCONAZOLE 150 MG PO TABS: 150.0000 mg | ORAL_TABLET | Freq: Every day | ORAL | 0 refills | Status: AC

## 2018-11-17 MED ORDER — LIDOCAINE HCL (PF) 1 % IJ SOLN
INTRAMUSCULAR | Status: AC
  Administered 2018-11-17: 13:00:00 5 mL
  Filled 2018-11-17: qty 5

## 2018-11-17 NOTE — Discharge Instructions (Addendum)
Please read attached information. If you experience any new or worsening signs or symptoms please return to the emergency room for evaluation. Please follow-up with your primary care provider or specialist as discussed. Please use medication prescribed only as directed and discontinue taking if you have any concerning signs or symptoms.   °

## 2018-11-17 NOTE — ED Provider Notes (Addendum)
[facility] Provider Note   CSN: [id number] Arrival date & time: 11/17/18  1025    History   Chief Complaint Chief Complaint  Patient presents with  . Vaginal Discharge    HPI Jordan AmesKelly Marrazzo is a 33 y.o. female.     HPI   33 year old female presents today with complaints of vaginal discharge.  She notes 3-day history of yellow malodorous discharge.  She notes a history of BV but notes this is different.  She notes she is sexually active with one female partner which is her husband.  She notes she went to the health department yesterday where they tested her for STDs but told her that the results would not come back from 7 days.  She did note yesterday that she was negative for trichomoniasis.  She notes she was told to come to the hospital for STD treatment.  She denies any abdominal pain urinary symptoms fever.  She notes her husband is not having symptoms.  She reports she is not pregnant or breast-feeding.    Past Medical History:  Diagnosis Date  . Abnormal Pap smear   . Asthma   . Hx of chlamydia infection     Patient Active Problem List   Diagnosis Date Noted  . Encounter for insertion of ParaGard IUD 07/30/2016    Past Surgical History:  Procedure Laterality Date  . COLPOSCOPY       OB History    Gravida  7   Para  4   Term  3   Preterm  1   AB  3   Living  3     SAB  2   TAB  0   Ectopic  0   Multiple  0   Live Births  3            Home Medications    Prior to Admission medications   Medication Sig Start Date End Date Taking? Authorizing Provider  albuterol (PROVENTIL HFA;VENTOLIN HFA) 108 (90 Base) MCG/ACT inhaler Inhale 1-2 puffs into the lungs every 6 (six) hours as needed for wheezing or shortness of breath. 08/18/16   Felicie MornSmith, David, NP  albuterol (PROVENTIL HFA;VENTOLIN HFA) 108 (90 Base) MCG/ACT inhaler Inhale 2 puffs into the lungs every 4 (four) hours as needed for wheezing or shortness of  breath. 02/08/17   Joy, Hillard DankerShawn C, PA-C  aspirin EC 81 MG tablet Take 81 mg by mouth daily.    [provider]  azithromycin (ZITHROMAX) 250 MG tablet Take 1 tablet (250 mg total) by mouth daily. Take first 2 tablets together, then 1 every day until finished. 02/08/17   Joy, Shawn C, PA-C  benzonatate (TESSALON) 100 MG capsule Take 1 capsule (100 mg total) by mouth every 8 (eight) hours. 08/18/16   Felicie MornSmith, David, NP  elvitegravir-cobicistat-emtricitabine-tenofovir (GENVOYA) 150-150-200-10 MG TABS tablet Take 1 tablet by mouth daily with breakfast. 05/14/18   Scheidler, Sherryle LisJames F II, MD  predniSONE (DELTASONE) 20 MG tablet 3 tabs po day one, then 2 tabs daily x 4 days 08/18/16   Felicie MornSmith, David, NP    Family History Family History  Problem Relation Age of Onset  . Asthma Daughter   . Hypertension Mother   . Thyroid disease Mother   . Hypertension Maternal Aunt   . Hypertension Maternal Grandmother   . Thyroid disease Maternal Grandmother   . Heart disease Father   . Seizures Father     Social History Social History   Tobacco  Use  . Smoking status: Current Some Day Smoker    Packs/day: 0.50    Types: Cigarettes  . Smokeless tobacco: Never Used  Substance Use Topics  . Alcohol use: Yes    Comment: occasionally until pregnancy  . Drug use: No     Allergies   Latex   Review of Systems Review of Systems  All other systems reviewed and are negative.   Physical Exam Updated Vital Signs BP (!) 142/101 (BP Location: Right Arm)   Pulse 79   Temp 98.4 F (36.9 C) (Oral)   Resp 20   SpO2 95%   Physical Exam Vitals signs and nursing note reviewed.  Constitutional:      Appearance: She is well-developed.  HENT:     Head: Normocephalic and atraumatic.  Eyes:     General: No scleral icterus.       Right eye: No discharge.        Left eye: No discharge.     Conjunctiva/sclera: Conjunctivae normal.     Pupils: Pupils are equal, round, and reactive to light.  Neck:      Musculoskeletal: Normal range of motion.     Vascular: No JVD.     Trachea: No tracheal deviation.  Pulmonary:     Effort: Pulmonary effort is normal.     Breath sounds: No stridor.  Abdominal:     Comments: Abdomen soft nontender  Neurological:     Mental Status: She is alert and oriented to person, place, and time.     Coordination: Coordination normal.  Psychiatric:        Behavior: Behavior normal.        Thought Content: Thought content normal.        Judgment: Judgment normal.      ED Treatments / Results  Labs (all labs ordered are listed, but only abnormal results are displayed) Labs Reviewed - No data to display  EKG None  Radiology No results found.  Procedures Procedures (including critical care time)  Medications Ordered in ED Medications  cefTRIAXone (ROCEPHIN) injection 250 mg (250 mg Intramuscular Given 11/17/18 1245)  azithromycin (ZITHROMAX) tablet 1,000 mg (1,000 mg Oral Given 11/17/18 1245)  lidocaine (PF) (XYLOCAINE) 1 % injection (5 mLs  Given 11/17/18 1246)     Initial Impression / Assessment and Plan / ED Course  I have reviewed the triage vital signs and the nursing notes.  Pertinent labs & imaging results that were available during my care of the patient were reviewed by me and considered in my medical decision making (see chart for details).          33 year old female presents today with vaginal discharge.  High suspicion for STD given symptoms.  Patient did have a pelvic exam yesterday and is here for treatment.  She has no signs or symptoms that would be consistent with pelvic inflammatory disease.  No need to repeat pelvic exam today.  Patient received ceftriaxone and azithromycin.  She will continue outpatient follow-up as needed return precautions given.  Verbalized understanding and agreement to today's plan. Final Clinical Impressions(s) / ED Diagnoses   Final diagnoses:  Vaginal discharge    ED Discharge Orders         Ordered     fluconazole (DIFLUCAN) 150 MG tablet  Daily     11/17/18 1242           Eyvonne MechanicHedges, Lanai Conlee, PA-C 11/21/18 1158    Eyvonne MechanicHedges, Davinci Glotfelty, PA-C 11/21/18 1159    Tegeler, Cristal Deerhristopher  J, MD 11/21/18 2142

## 2018-11-17 NOTE — ED Triage Notes (Signed)
Pt reports vaginal discharge for the past 2 days denies any pain would just like to be tested for std.

## 2019-04-09 ENCOUNTER — Encounter (HOSPITAL_COMMUNITY): Payer: Self-pay | Admitting: Emergency Medicine

## 2019-04-09 ENCOUNTER — Other Ambulatory Visit: Payer: Self-pay

## 2019-04-09 ENCOUNTER — Emergency Department (HOSPITAL_COMMUNITY)
Admission: EM | Admit: 2019-04-09 | Discharge: 2019-04-09 | Disposition: A | Discharge status: Home / Self Care | Payer: Medicaid Other | Attending: Emergency Medicine | Admitting: Emergency Medicine

## 2019-04-09 DIAGNOSIS — F17210 Nicotine dependence, cigarettes, uncomplicated: Secondary | ICD-10-CM | POA: Insufficient documentation

## 2019-04-09 DIAGNOSIS — N6310 Unspecified lump in the right breast, unspecified quadrant: Secondary | ICD-10-CM | POA: Diagnosis present

## 2019-04-09 DIAGNOSIS — J45909 Unspecified asthma, uncomplicated: Secondary | ICD-10-CM | POA: Diagnosis not present

## 2019-04-09 DIAGNOSIS — N390 Urinary tract infection, site not specified: Secondary | ICD-10-CM | POA: Insufficient documentation

## 2019-04-09 DIAGNOSIS — Z91040 Latex allergy status: Secondary | ICD-10-CM | POA: Diagnosis not present

## 2019-04-09 DIAGNOSIS — Z7982 Long term (current) use of aspirin: Secondary | ICD-10-CM | POA: Diagnosis not present

## 2019-04-09 DIAGNOSIS — Z79899 Other long term (current) drug therapy: Secondary | ICD-10-CM | POA: Insufficient documentation

## 2019-04-09 DIAGNOSIS — N63 Unspecified lump in unspecified breast: Secondary | ICD-10-CM

## 2019-04-09 LAB — URINALYSIS, ROUTINE W REFLEX MICROSCOPIC
Bilirubin Urine: NEGATIVE
Glucose, UA: NEGATIVE mg/dL
Hgb urine dipstick: NEGATIVE
Ketones, ur: NEGATIVE mg/dL
Nitrite: NEGATIVE
Protein, ur: NEGATIVE mg/dL
Specific Gravity, Urine: 1.01 (ref 1.005–1.030)
pH: 7 (ref 5.0–8.0)

## 2019-04-09 MED ORDER — SULFAMETHOXAZOLE-TRIMETHOPRIM 800-160 MG PO TABS: 1.0000 | ORAL_TABLET | Freq: Two times a day (BID) | ORAL | 0 refills | Status: AC

## 2019-04-09 MED ORDER — SULFAMETHOXAZOLE-TRIMETHOPRIM 800-160 MG PO TABS: 1.0000 | ORAL_TABLET | Freq: Two times a day (BID) | ORAL | 0 refills | Status: DC

## 2019-04-09 NOTE — ED Triage Notes (Signed)
Pt reports 3 lumps on right breast that she found in the shower tonight, denies any pain.

## 2019-04-09 NOTE — ED Notes (Signed)
Patient verbalizes understanding of discharge instructions. Opportunity for questioning and answers were provided. Armband removed by staff, pt discharged from ED.  

## 2019-04-09 NOTE — ED Provider Notes (Signed)
[facility] EMERGENCY DEPARTMENT Provider Note   CSN: [id number] Arrival date & time: 04/09/19  2010     History No chief complaint on file.   Jordan Hall is a 33 y.o. female.  Patient is a 33 year old female with past medical history of abnormal Pap smear presenting to the emergency department for breast mass.  Patient reports that she was taking a shower tonight and was feeling in her right breast and felt that there were 2 "lumps" in her breast.  Reports that she does not usually do breast exams on herself.  Reports that she is not having any pain or tenderness.  No skin changes, nipple discharge, fevers.  She is not breast-feeding and her last menstrual period was December 3.  No history or family history of any breast cancer.        Past Medical History:  Diagnosis Date  . Abnormal Pap smear   . Asthma   . Hx of chlamydia infection     Patient Active Problem List   Diagnosis Date Noted  . Encounter for insertion of ParaGard IUD 07/30/2016    Past Surgical History:  Procedure Laterality Date  . COLPOSCOPY       OB History    Gravida  7   Para  4   Term  3   Preterm  1   AB  3   Living  3     SAB  2   TAB  0   Ectopic  0   Multiple  0   Live Births  3           Family History  Problem Relation Age of Onset  . Asthma Daughter   . Hypertension Mother   . Thyroid disease Mother   . Hypertension Maternal Aunt   . Hypertension Maternal Grandmother   . Thyroid disease Maternal Grandmother   . Heart disease Father   . Seizures Father     Social History   Tobacco Use  . Smoking status: Current Some Day Smoker    Packs/day: 0.50    Types: Cigarettes  . Smokeless tobacco: Never Used  Substance Use Topics  . Alcohol use: Yes    Comment: occasionally until pregnancy  . Drug use: No    Home Medications Prior to Admission medications   Medication Sig Start Date End Date Taking? Authorizing Provider  albuterol  (PROVENTIL HFA;VENTOLIN HFA) 108 (90 Base) MCG/ACT inhaler Inhale 1-2 puffs into the lungs every 6 (six) hours as needed for wheezing or shortness of breath. 08/18/16   Etta Quill, NP  albuterol (PROVENTIL HFA;VENTOLIN HFA) 108 (90 Base) MCG/ACT inhaler Inhale 2 puffs into the lungs every 4 (four) hours as needed for wheezing or shortness of breath. 02/08/17   Joy, Helane Gunther, PA-C  aspirin EC 81 MG tablet Take 81 mg by mouth daily.    [provider]  azithromycin (ZITHROMAX) 250 MG tablet Take 1 tablet (250 mg total) by mouth daily. Take first 2 tablets together, then 1 every day until finished. 02/08/17   Joy, Shawn C, PA-C  benzonatate (TESSALON) 100 MG capsule Take 1 capsule (100 mg total) by mouth every 8 (eight) hours. 08/18/16   Etta Quill, NP  elvitegravir-cobicistat-emtricitabine-tenofovir (GENVOYA) 150-150-200-10 MG TABS tablet Take 1 tablet by mouth daily with breakfast. 05/14/18   Scheidler, Rodena Goldmann, MD  predniSONE (DELTASONE) 20 MG tablet 3 tabs po day one, then 2 tabs daily x 4 days 08/18/16   Etta Quill,  NP  sulfamethoxazole-trimethoprim (BACTRIM DS) 800-160 MG tablet Take 1 tablet by mouth 2 (two) times daily for 5 days. 04/09/19 04/14/19  Arlyn DunningMcLean, Markayla A, PA-C    Allergies    Latex  Review of Systems   Review of Systems  Constitutional: Negative for appetite change, chills and fever.  HENT: Negative for congestion.   Respiratory: Negative for cough and shortness of breath.   Gastrointestinal: Negative for abdominal pain.  Genitourinary: Negative for dysuria.  Musculoskeletal: Negative for back pain.  Skin: Negative for wound.  Neurological: Negative for dizziness, light-headedness and headaches.    Physical Exam Updated Vital Signs BP (!) 153/98 (BP Location: Right Arm)   Pulse 69   Temp 99.6 F (37.6 C) (Oral)   Resp 14   LMP 03/16/2019   SpO2 97%   Physical Exam Vitals and nursing note reviewed.  Constitutional:      Appearance: Normal appearance.    HENT:     Head: Normocephalic.  Eyes:     Conjunctiva/sclera: Conjunctivae normal.  Pulmonary:     Effort: Pulmonary effort is normal.  Chest:     Chest wall: No mass, lacerations, deformity, swelling, tenderness, crepitus or edema.     Breasts: Breasts are symmetrical.        Right: No swelling, bleeding, inverted nipple, nipple discharge, skin change or tenderness.        Left: No swelling, bleeding, inverted nipple, nipple discharge, skin change or tenderness.    Abdominal:     General: Abdomen is flat.     Tenderness: There is no abdominal tenderness. There is no guarding.  Lymphadenopathy:     Upper Body:     Right upper body: No supraclavicular or axillary adenopathy.  Skin:    General: Skin is dry.  Neurological:     Mental Status: She is alert.  Psychiatric:        Mood and Affect: Mood normal.     ED Results / Procedures / Treatments   Labs (all labs ordered are listed, but only abnormal results are displayed) Labs Reviewed  URINALYSIS, ROUTINE W REFLEX MICROSCOPIC - Abnormal; Notable for the following components:      Result Value   Color, Urine STRAW (*)    APPearance HAZY (*)    Leukocytes,Ua SMALL (*)    Bacteria, UA RARE (*)    All other components within normal limits  URINE CULTURE    EKG None  Radiology No results found.  Procedures Procedures (including critical care time)  Medications Ordered in ED Medications - No data to display  ED Course  I have reviewed the triage vital signs and the nursing notes.  Pertinent labs & imaging results that were available during my care of the patient were reviewed by me and considered in my medical decision making (see chart for details).  Clinical Course as of Apr 08 2128  Wynelle LinkSun Apr 09, 2019  2041 Patient presenting with new breast masses palpated tonight in the shower.  No emergent skin findings to suggest an abscess.  I think that this is likely benign breast tissue.  However, I have ordered a  breast ultrasound she [facility].  She is not having any nipple changes, nipple discharge or bleeding.  No lymphadenopathy, no tenderness.  Patient understands on return precautions.  She also asked to check her urine for UTI today.   [KM]    Clinical Course User Index [KM] Arlyn DunningMcLean, Lyrika A, PA-C  MDM Rules/Calculators/A&P                      Based on review of vitals, medical screening exam, lab work and/or imaging, there does not appear to be an acute, emergent etiology for the patient's symptoms. Counseled pt on good return precautions and encouraged both PCP and ED follow-up as needed.  Prior to discharge, I also discussed incidental imaging findings with patient in detail and advised appropriate, recommended follow-up in detail.  Clinical Impression: 1. Acute UTI   2. Breast mass in female     Disposition: Discharge  Prior to providing a prescription for a controlled substance, I independently reviewed the patient's recent prescription history on the [place] Controlled Substance Reporting System. The patient had no recent or regular prescriptions and was deemed appropriate for a brief, less than 3 day prescription of narcotic for acute analgesia.  This note was prepared with assistance of Conservation officer, historic buildings. Occasional wrong-word or sound-a-like substitutions may have occurred due to the inherent limitations of voice recognition software.  Final Clinical Impression(s) / ED Diagnoses Final diagnoses:  Breast mass in female  Acute UTI    Rx / DC Orders ED Discharge Orders         Ordered    [place] Unlisted Procedure Breast     04/09/19 2039    sulfamethoxazole-trimethoprim (BACTRIM DS) 800-160 MG tablet  2 times daily     04/09/19 2128           Reality, Dejonge 04/09/19 2129    Little, Ambrose Finland, MD 04/09/19 2145

## 2019-04-09 NOTE — Discharge Instructions (Addendum)
You are seen today for a new breast mass.  I think that this is probably benign.  However, I have ordered an ultrasound to be done at the breast center of [facility] which is here in this hospital.  They open at 7 AM tomorrow so you can go there whenever is convenient to have the ultrasound done.  Follow-up with OB/GYN.

## 2019-04-10 ENCOUNTER — Emergency Department (HOSPITAL_COMMUNITY)
Admission: EM | Admit: 2019-04-10 | Discharge: 2019-04-10 | Discharge status: Against Medical Advice | Payer: Medicaid Other | Attending: Emergency Medicine | Admitting: Emergency Medicine

## 2019-04-10 DIAGNOSIS — Z5321 Procedure and treatment not carried out due to patient leaving prior to being seen by health care provider: Secondary | ICD-10-CM | POA: Insufficient documentation

## 2019-04-10 DIAGNOSIS — N63 Unspecified lump in unspecified breast: Secondary | ICD-10-CM | POA: Diagnosis present

## 2019-04-10 NOTE — ED Triage Notes (Signed)
Pt did not need to register--was trying to get a referral to breast center-- they would not take a referral from the ED>

## 2019-04-12 LAB — URINE CULTURE: Culture: >=100000 — AB

## 2019-04-13 ENCOUNTER — Telehealth: Payer: Self-pay | Admitting: Emergency Medicine

## 2019-04-13 NOTE — Telephone Encounter (Signed)
Post ED Visit - Positive Culture Follow-up  Culture report reviewed by antimicrobial stewardship pharmacist: [place] Team []  Elenor Quinones, Pharm.D. []  Heide Guile, Pharm.D., BCPS AQ-ID []  Parks Neptune, Pharm.D., BCPS []  Alycia Rossetti, Pharm.D., BCPS []  [place], Pharm.D., BCPS, AAHIVP []  Legrand Como, Pharm.D., BCPS, AAHIVP []  Salome Arnt, PharmD, BCPS []  Johnnette Gourd, PharmD, BCPS []  Hughes Better, PharmD, BCPS []  Leeroy Cha, PharmD [x]  Laqueta Linden, PharmD, BCPS []  Albertina Parr, PharmD  [place] Team []  Leodis Sias, PharmD []  Lindell Spar, PharmD []  Royetta Asal, PharmD []  Graylin Shiver, Rph []  Rema Fendt) Glennon Mac, PharmD []  Arlyn Dunning, PharmD []  Netta Cedars, PharmD []  Dia Sitter, PharmD []  Leone Haven, PharmD []  Gretta Arab, PharmD []  Theodis Shove, PharmD []  Peggyann Juba, PharmD []  Reuel Boom, PharmD   Positive urine culture Treated with sulfamethoxazole-trimethoprim, organism sensitive to the same and no further patient follow-up is required at this time.  Hazle Nordmann 04/13/2019, 3:33 PM

## 2019-05-26 ENCOUNTER — Other Ambulatory Visit: Payer: Self-pay | Admitting: Family

## 2019-05-26 DIAGNOSIS — N6310 Unspecified lump in the right breast, unspecified quadrant: Secondary | ICD-10-CM

## 2019-05-30 ENCOUNTER — Other Ambulatory Visit: Payer: Self-pay

## 2019-05-30 ENCOUNTER — Ambulatory Visit
Admission: RE | Admit: 2019-05-30 | Discharge: 2019-05-30 | Disposition: A | Discharge status: Home / Self Care | Payer: Medicaid Other | Source: Ambulatory Visit | Attending: Family | Admitting: Family

## 2019-05-30 ENCOUNTER — Other Ambulatory Visit: Payer: Self-pay | Admitting: Family

## 2019-05-30 DIAGNOSIS — N6310 Unspecified lump in the right breast, unspecified quadrant: Secondary | ICD-10-CM

## 2019-08-30 ENCOUNTER — Other Ambulatory Visit: Payer: Medicaid Other

## 2020-03-23 ENCOUNTER — Emergency Department (HOSPITAL_COMMUNITY)
Admission: EM | Admit: 2020-03-23 | Discharge: 2020-03-23 | Disposition: A | Discharge status: Home / Self Care | Payer: Medicaid Other | Attending: Emergency Medicine | Admitting: Emergency Medicine

## 2020-03-23 ENCOUNTER — Other Ambulatory Visit: Payer: Self-pay

## 2020-03-23 ENCOUNTER — Encounter (HOSPITAL_COMMUNITY): Payer: Self-pay

## 2020-03-23 DIAGNOSIS — Z5321 Procedure and treatment not carried out due to patient leaving prior to being seen by health care provider: Secondary | ICD-10-CM | POA: Diagnosis not present

## 2020-03-23 DIAGNOSIS — K0889 Other specified disorders of teeth and supporting structures: Secondary | ICD-10-CM | POA: Insufficient documentation

## 2020-03-23 DIAGNOSIS — R110 Nausea: Secondary | ICD-10-CM | POA: Insufficient documentation

## 2020-03-23 NOTE — ED Notes (Signed)
Pt stated she is leaving because of wait time.  °

## 2020-03-23 NOTE — ED Triage Notes (Signed)
Patient complains of right upper dental pain for several days and no relief with otc relief. Patient reports nausea with same

## 2020-08-12 ENCOUNTER — Ambulatory Visit: Payer: Medicaid Other | Admitting: Student

## 2021-11-03 ENCOUNTER — Ambulatory Visit: Payer: Medicaid Other | Admitting: Family Medicine

## 2022-11-06 ENCOUNTER — Other Ambulatory Visit: Payer: Self-pay

## 2022-11-06 ENCOUNTER — Encounter (HOSPITAL_COMMUNITY): Payer: Self-pay | Admitting: Emergency Medicine

## 2022-11-06 ENCOUNTER — Emergency Department (HOSPITAL_COMMUNITY)
Admission: EM | Admit: 2022-11-06 | Discharge: 2022-11-06 | Disposition: A | Discharge status: Home / Self Care | Payer: Medicaid Other | Attending: Emergency Medicine | Admitting: Emergency Medicine

## 2022-11-06 DIAGNOSIS — Z91040 Latex allergy status: Secondary | ICD-10-CM | POA: Insufficient documentation

## 2022-11-06 DIAGNOSIS — Z7982 Long term (current) use of aspirin: Secondary | ICD-10-CM | POA: Diagnosis not present

## 2022-11-06 DIAGNOSIS — Z30432 Encounter for removal of intrauterine contraceptive device: Secondary | ICD-10-CM | POA: Insufficient documentation

## 2022-11-06 DIAGNOSIS — R102 Pelvic and perineal pain: Secondary | ICD-10-CM | POA: Diagnosis not present

## 2022-11-06 LAB — URINALYSIS, ROUTINE W REFLEX MICROSCOPIC
Bacteria, UA: NONE SEEN
Bilirubin Urine: NEGATIVE
Glucose, UA: NEGATIVE mg/dL
Hgb urine dipstick: NEGATIVE
Ketones, ur: NEGATIVE mg/dL
Nitrite: NEGATIVE
Protein, ur: NEGATIVE mg/dL
Specific Gravity, Urine: 1.029 (ref 1.005–1.030)
pH: 6 (ref 5.0–8.0)

## 2022-11-06 MED ORDER — ACETAMINOPHEN 500 MG PO TABS
1000.0000 mg | ORAL_TABLET | Freq: Once | ORAL | Status: AC
  Administered 2022-11-06: 1000 mg via ORAL
  Filled 2022-11-06: qty 2

## 2022-11-06 NOTE — Discharge Instructions (Signed)
As we discussed, I have removed your IUD today.  Please complete your course of metronidazole that was prescribed to you by your OB/GYN.  Please be aware that you will need to seek other birth control options before returning to sexual activity.  Return if development of any new or worsening symptoms.

## 2022-11-06 NOTE — ED Provider Notes (Signed)
[place] EMERGENCY DEPARTMENT AT [facility] Provider Note   CSN: [id number] Arrival date & time: 11/06/22  1406     History  Chief Complaint  Patient presents with   Pelvic Pain    Jordan Hall is a 37 y.o. female.  Patient with no pertinent past medical history presents today requesting to have her IUD removed. She states that she has the [place] and it has been in place for 6 years without issue until a month ago when she started having some discomfort in her pelvic area with discharge. She has been followed by her OB/GYN who has diagnosed her with BV twice in the past month and she is currently completing her second round of Flagyl for this.  States that her pain is continued and she called her OB today and they told her that she should probably have her IUD removed but they did not have any appointments available until next week and recommended if she cannot wait until them to come to the ER to have her IUD removed.  She presents for same.  She denies any nausea, vomiting, diarrhea.  No urinary symptoms.  When she tested positive for BV earlier in the week she also had STD testing that was negative.   The history is provided by the patient. No language interpreter was used.  Pelvic Pain       Home Medications Prior to Admission medications   Medication Sig Start Date End Date Taking? Authorizing Provider  albuterol (PROVENTIL HFA;VENTOLIN HFA) 108 (90 Base) MCG/ACT inhaler Inhale 1-2 puffs into the lungs every 6 (six) hours as needed for wheezing or shortness of breath. 08/18/16   Felicie Morn, NP  albuterol (PROVENTIL HFA;VENTOLIN HFA) 108 (90 Base) MCG/ACT inhaler Inhale 2 puffs into the lungs every 4 (four) hours as needed for wheezing or shortness of breath. 02/08/17   Joy, Hillard Danker, PA-C  aspirin EC 81 MG tablet Take 81 mg by mouth daily.    [provider]  azithromycin (ZITHROMAX) 250 MG tablet Take 1 tablet (250 mg total) by mouth daily. Take first 2  tablets together, then 1 every day until finished. 02/08/17   Joy, Shawn C, PA-C  benzonatate (TESSALON) 100 MG capsule Take 1 capsule (100 mg total) by mouth every 8 (eight) hours. 08/18/16   Felicie Morn, NP  elvitegravir-cobicistat-emtricitabine-tenofovir (GENVOYA) 150-150-200-10 MG TABS tablet Take 1 tablet by mouth daily with breakfast. 05/14/18   Scheidler, Sherryle Lis, MD  predniSONE (DELTASONE) 20 MG tablet 3 tabs po day one, then 2 tabs daily x 4 days 08/18/16   Felicie Morn, NP      Allergies    Latex    Review of Systems   Review of Systems  Genitourinary:  Positive for pelvic pain.  All other systems reviewed and are negative.   Physical Exam Updated Vital Signs BP 136/89 (BP Location: Right Arm)   Pulse (!) 101   Temp 99.1 F (37.3 C) (Oral)   Resp 18   Ht 5\' 4"  (1.626 m)   Wt 76.7 kg   LMP 10/16/2022 (Approximate)   SpO2 98%   BMI 29.01 kg/m  Physical Exam Vitals and nursing note reviewed. Exam conducted with a chaperone present.  Constitutional:      General: She is not in acute distress.    Appearance: Normal appearance. She is normal weight. She is not ill-appearing, toxic-appearing or diaphoretic.  HENT:     Head: Normocephalic and atraumatic.  Cardiovascular:  Rate and Rhythm: Normal rate.  Pulmonary:     Effort: Pulmonary effort is normal. No respiratory distress.  Abdominal:     General: Abdomen is flat.     Palpations: Abdomen is soft.     Tenderness: There is no abdominal tenderness.  Genitourinary:    Comments: Discharge present in the vagina. No CMT or adnexal tenderness. IUD strings visualized. Musculoskeletal:        General: Normal range of motion.     Cervical back: Normal range of motion.  Skin:    General: Skin is warm and dry.  Neurological:     General: No focal deficit present.     Mental Status: She is alert.  Psychiatric:        Mood and Affect: Mood normal.        Behavior: Behavior normal.     ED Results / Procedures /  Treatments   Labs (all labs ordered are listed, but only abnormal results are displayed) Labs Reviewed  URINALYSIS, ROUTINE W REFLEX MICROSCOPIC    EKG None  Radiology No results found.  Procedures .Foreign Body Removal  Date/Time: 11/06/2022 5:32 PM  Performed by: Silva Bandy, PA-C Authorized by: Silva Bandy, PA-C  Consent: Verbal consent obtained. Risks and benefits: risks, benefits and alternatives were discussed Consent given by: patient Patient understanding: patient states understanding of the procedure being performed Patient consent: the patient's understanding of the procedure matches consent given Procedure consent: procedure consent matches procedure scheduled Relevant documents: relevant documents present and verified Test results: test results available and properly labeled Required items: required blood products, implants, devices, and special equipment available Patient identity confirmed: verbally with patient Body area: vagina  Sedation: Patient sedated: no  Patient restrained: no Patient cooperative: yes Localization method: speculum Removal mechanism: ring forceps Complexity: simple 1 objects recovered. Objects recovered: IUD Post-procedure assessment: foreign body removed Patient tolerance: patient tolerated the procedure well with no immediate complications      Medications Ordered in ED Medications  acetaminophen (TYLENOL) tablet 1,000 mg (1,000 mg Oral Given 11/06/22 1642)    ED Course/ Medical Decision Making/ A&P                             Medical Decision Making Amount and/or Complexity of Data Reviewed Labs: ordered.  Risk OTC drugs.   Patient presents today requesting to have her IUD removed. She is afebrile, non-toxic appearing, and in no acute distress with reassuring vital signs. She has some low pelvic discomfort but no tenderness to palpation. IUD removed per patients request per above procedure which was well  tolerated. Some discharge present, however patient was swabbed earlier in the week and tested positive for BV only and is currently completing treatment with Flagyl.  No CMT or adnexal tenderness. Evaluation and diagnostic testing in the emergency department does not suggest an emergent condition requiring admission or immediate intervention beyond what has been performed at this time.  Plan for discharge with close PCP follow-up.  Patient is understanding and amenable with plan, educated on red flag symptoms that would prompt immediate return.  Patient discharged in stable condition.  Findings and plan of care discussed with supervising physician Dr. Hyacinth Meeker who is in agreement.   Final Clinical Impression(s) / ED Diagnoses Final diagnoses:  Encounter for IUD removal    Rx / DC Orders ED Discharge Orders     None     An After Visit Summary  was printed and given to the patient.     Vear Clock 11/06/22 1737    Eber Hong, MD 11/07/22 [phone]

## 2022-11-06 NOTE — ED Triage Notes (Signed)
Pt via POV c/o severe pelvic pain ongoing. Pt has an IUD that has been causing a lot of issues including recurrent BV and bleeding. Her OB has told her they need to remove the IUD but they have no available appointments at this time so she advised pt to come to ER for eval. Pain rated 9/10.
